# Patient Record
Sex: Male | Born: 1955
Health system: Southern US, Community
[De-identification: ages and names within clinical notes are randomized; demographics above are authoritative.]

## PROBLEM LIST (undated history)

## (undated) DIAGNOSIS — T7840XA Allergy, unspecified, initial encounter: Secondary | ICD-10-CM

## (undated) DIAGNOSIS — R030 Elevated blood-pressure reading, without diagnosis of hypertension: Secondary | ICD-10-CM

## (undated) DIAGNOSIS — K635 Polyp of colon: Secondary | ICD-10-CM

## (undated) DIAGNOSIS — T8859XA Other complications of anesthesia, initial encounter: Secondary | ICD-10-CM

## (undated) DIAGNOSIS — E785 Hyperlipidemia, unspecified: Secondary | ICD-10-CM

## (undated) DIAGNOSIS — L259 Unspecified contact dermatitis, unspecified cause: Secondary | ICD-10-CM

## (undated) DIAGNOSIS — I509 Heart failure, unspecified: Secondary | ICD-10-CM

## (undated) DIAGNOSIS — H9209 Otalgia, unspecified ear: Secondary | ICD-10-CM

## (undated) HISTORY — DX: Polyp of colon: K63.5

## (undated) HISTORY — DX: Otalgia, unspecified ear: H92.09

## (undated) HISTORY — PX: EYE MUSCLE SURGERY: SHX370

## (undated) HISTORY — DX: Hyperlipidemia, unspecified: E78.5

## (undated) HISTORY — DX: Unspecified contact dermatitis, unspecified cause: L25.9

## (undated) HISTORY — PX: ROTATOR CUFF REPAIR: SHX139

## (undated) HISTORY — DX: Allergy, unspecified, initial encounter: T78.40XA

## (undated) HISTORY — DX: Elevated blood-pressure reading, without diagnosis of hypertension: R03.0

## (undated) HISTORY — PX: MIDDLE EAR SURGERY: SHX713

---
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
## 1981-06-28 HISTORY — PX: APPENDECTOMY: SHX54

## 2006-07-28 LAB — HM COLONOSCOPY

## 2007-03-24 ENCOUNTER — Encounter: Payer: Self-pay | Admitting: Family Medicine

## 2007-07-07 ENCOUNTER — Encounter: Payer: Self-pay | Admitting: Family Medicine

## 2008-07-12 ENCOUNTER — Encounter: Payer: Self-pay | Admitting: Family Medicine

## 2008-10-15 ENCOUNTER — Encounter: Payer: Self-pay | Admitting: Family Medicine

## 2009-08-27 ENCOUNTER — Ambulatory Visit: Payer: Self-pay | Admitting: Family Medicine

## 2009-08-27 DIAGNOSIS — E785 Hyperlipidemia, unspecified: Secondary | ICD-10-CM

## 2009-08-27 HISTORY — DX: Hyperlipidemia, unspecified: E78.5

## 2009-08-28 LAB — CONVERTED CEMR LAB
ALT: 26 units/L (ref 0–53)
AST: 25 units/L (ref 0–37)
Albumin: 4.3 g/dL (ref 3.5–5.2)
Alkaline Phosphatase: 69 units/L (ref 39–117)
BUN: 13 mg/dL (ref 6–23)
Basophils Absolute: 0 10*3/uL (ref 0.0–0.1)
Basophils Relative: 0.6 % (ref 0.0–3.0)
Bilirubin, Direct: 0.1 mg/dL (ref 0.0–0.3)
CO2: 27 meq/L (ref 19–32)
Calcium: 9.2 mg/dL (ref 8.4–10.5)
Chloride: 108 meq/L (ref 96–112)
Cholesterol: 158 mg/dL (ref 0–200)
Creatinine, Ser: 0.9 mg/dL (ref 0.4–1.5)
Eosinophils Absolute: 0.3 10*3/uL (ref 0.0–0.7)
Eosinophils Relative: 7.5 % — ABNORMAL HIGH (ref 0.0–5.0)
GFR calc non Af Amer: 93.99 mL/min (ref >60)
Glucose, Bld: 91 mg/dL (ref 70–99)
HCT: 45.8 % (ref 39.0–52.0)
HDL: 42.7 mg/dL (ref >39.00)
Hemoglobin: 15.5 g/dL (ref 13.0–17.0)
LDL Cholesterol: 98 mg/dL (ref 0–99)
Lymphocytes Relative: 21.4 % (ref 12.0–46.0)
Lymphs Abs: 1 10*3/uL (ref 0.7–4.0)
MCHC: 33.9 g/dL (ref 30.0–36.0)
MCV: 93 fL (ref 78.0–100.0)
Monocytes Absolute: 0.4 10*3/uL (ref 0.1–1.0)
Monocytes Relative: 7.7 % (ref 3.0–12.0)
Neutro Abs: 2.9 10*3/uL (ref 1.4–7.7)
Neutrophils Relative %: 62.8 % (ref 43.0–77.0)
PSA: 0.75 ng/mL (ref 0.10–4.00)
Platelets: 186 10*3/uL (ref 150.0–400.0)
Potassium: 4.2 meq/L (ref 3.5–5.1)
RBC: 4.93 M/uL (ref 4.22–5.81)
RDW: 12 % (ref 11.5–14.6)
Sodium: 141 meq/L (ref 135–145)
TSH: 1.04 microintl units/mL (ref 0.35–5.50)
Total Bilirubin: 0.6 mg/dL (ref 0.3–1.2)
Total CHOL/HDL Ratio: 4
Total Protein: 6.9 g/dL (ref 6.0–8.3)
Triglycerides: 86 mg/dL (ref 0.0–149.0)
VLDL: 17.2 mg/dL (ref 0.0–40.0)
WBC: 4.6 10*3/uL (ref 4.5–10.5)

## 2009-09-03 ENCOUNTER — Ambulatory Visit: Payer: Self-pay | Admitting: Family Medicine

## 2009-09-03 ENCOUNTER — Telehealth (INDEPENDENT_AMBULATORY_CARE_PROVIDER_SITE_OTHER): Payer: Self-pay | Admitting: *Deleted

## 2009-09-03 DIAGNOSIS — L259 Unspecified contact dermatitis, unspecified cause: Secondary | ICD-10-CM

## 2009-09-03 HISTORY — DX: Unspecified contact dermatitis, unspecified cause: L25.9

## 2009-09-18 ENCOUNTER — Ambulatory Visit: Payer: Self-pay | Admitting: Family Medicine

## 2009-09-18 LAB — CONVERTED CEMR LAB: Rapid Strep: NEGATIVE

## 2009-09-26 ENCOUNTER — Telehealth: Payer: Self-pay | Admitting: Family Medicine

## 2010-06-11 ENCOUNTER — Ambulatory Visit: Payer: Self-pay | Admitting: Family Medicine

## 2010-06-19 ENCOUNTER — Encounter: Payer: Self-pay | Admitting: Family Medicine

## 2010-06-19 DIAGNOSIS — R030 Elevated blood-pressure reading, without diagnosis of hypertension: Secondary | ICD-10-CM

## 2010-06-19 DIAGNOSIS — H9209 Otalgia, unspecified ear: Secondary | ICD-10-CM | POA: Insufficient documentation

## 2010-06-19 HISTORY — DX: Elevated blood-pressure reading, without diagnosis of hypertension: R03.0

## 2010-06-19 HISTORY — DX: Otalgia, unspecified ear: H92.09

## 2010-06-19 LAB — CONVERTED CEMR LAB
Bilirubin Urine: NEGATIVE
Blood in Urine, dipstick: NEGATIVE
Glucose, Urine, Semiquant: NEGATIVE
Ketones, urine, test strip: NEGATIVE
Nitrite: NEGATIVE
Protein, U semiquant: NEGATIVE
Specific Gravity, Urine: 1.025
Urobilinogen, UA: 0.2
WBC Urine, dipstick: NEGATIVE
pH: 6

## 2010-07-30 NOTE — Assessment & Plan Note (Signed)
Summary: st/njr   Vital Signs:  Patient profile:   55 year old male Temp:     98.8 degrees F oral BP sitting:   130 / 90  (left arm) Cuff size:   regular  Vitals Entered By: Sid Falcon LPN (September 18, 2009 2:17 PM) CC: sore throat X 4 days, URI symptoms   History of Present Illness:       This is a 55 year old man who presents with URI symptoms.  The patient complains of nasal congestion, purulent nasal discharge, sore throat, productive cough, and sick contacts, but denies earache.  The patient denies dyspnea and wheezing.    Allergies: 1)  Penicillin V Potassium (Penicillin V Potassium) 2)  Demerol (Meperidine Hcl)  Past History:  Past Medical History: Last updated: 08/27/2009 Hyperlipidemia colon polyps PMH reviewed for relevance  Review of Systems      See HPI  Physical Exam  General:  Well-developed,well-nourished,in no acute distress; alert,appropriate and cooperative throughout examination Ears:  extensive scarring R TM, L normal. Nose:  clear rhinorrhea. Mouth:  Oral mucosa and oropharynx without lesions or exudates.  Teeth in good repair. Neck:  No deformities, masses, or tenderness noted. Lungs:  Normal respiratory effort, chest expands symmetrically. Lungs are clear to auscultation, no crackles or wheezes. Heart:  normal rate and regular rhythm.     Impression & Recommendations:  Problem # 1:  ACUTE BRONCHITIS (ICD-466.0) supect viral ..  OTC mucinex and f/u as needed.  Rapid strep neg.  Complete Medication List: 1)  Prednisone 10 Mg Tabs (Prednisone) .... Taper as follows:  6-5-4-4-4-3-3-2-2-1-1  Other Orders: Rapid Strep (36644)  Patient Instructions: 1)  Acute Bronchitis symptoms for less then 10 days are not  helped by antibiotics. Take over the counter cough medications. Call if no improvement in 5-7 days, sooner if increasing cough, fever, or new symptoms ( shortness of breath, chest pain) .   Laboratory Results  Date/Time Received: September 18, 2009 2:10 PM Date/Time Reported: September 18, 2009 2:11 PM  Other Tests  Rapid Strep: negative Comments Wynona Canes, CMA  September 18, 2009 2:11 PM

## 2010-07-30 NOTE — Assessment & Plan Note (Signed)
Summary: DOT CPX/RCD    Vital Signs:  Patient profile:   55 year old male Height:      67.5 inches Weight:      194 pounds BMI:     30.04 O2 Sat:      95 % Temp:     98.3 degrees F Pulse rate:   87 / minute BP sitting:   124 / 92  (left arm) Cuff size:   large  Vitals Entered By: Pura Spice, RN (June 19, 2010 8:21 AM)  Nutrition Counseling: Patient's BMI is greater than 25 and therefore counseled on weight management options. CC: DOT form to be completed  Vision Screening:Left eye w/o correction: 20 / 20 Right Eye w/o correction: 20 / 40 Both eyes w/o correction:  20/ 20         History of Present Illness: Pt here for the following:  R ear fullness for couple of weeks.  No real pain. Hx multiple surgeries in past.  No drainage.  No vertigo or hearing changes.  No sinus congestion.  Elevated BP.  Only diastolic borderline high with 16X to low 90s at home no dizziness or chest pain.  No consistent exercise. Watches sodium.  Needs DOT form completed today.  CPE last  March.  Hx of hyperlipidemia but controlled with last labs.  No hx of CAD.  Allergies: 1)  Penicillin V Potassium (Penicillin V Potassium) 2)  Demerol (Meperidine Hcl)  Past History:  Past Medical History: Last updated: 08/27/2009 Hyperlipidemia colon polyps  Past Surgical History: Last updated: 08/27/2009 Appendectomy 1983 Tonsillectomy 1965  Family History: Last updated: 08/27/2009 Family History High cholesterol, father Heart disease-mother, age 42 MI,  Brother MI age 63 Father ?TIA 70s Sister, Type ll diabetes Mother ?lung cancer 52s  Social History: Last updated: 08/27/2009 Occupation:  Technical sales engineer Married Never Smoked Alcohol use-no  Risk Factors: Smoking Status: never (08/27/2009) PMH-FH-SH reviewed for relevance  Review of Systems  The patient denies anorexia, fever, weight loss, vision loss, decreased hearing, hoarseness, chest pain, syncope, dyspnea on  exertion, peripheral edema, prolonged cough, headaches, hemoptysis, abdominal pain, melena, hematochezia, and severe indigestion/heartburn.    Physical Exam  General:  Well-developed,well-nourished,in no acute distress; alert,appropriate and cooperative throughout examination Head:  Normocephalic and atraumatic without obvious abnormalities. No apparent alopecia or balding. Eyes:  No corneal or conjunctival inflammation noted. EOMI. Perrla. Funduscopic exam benign, without hemorrhages, exudates or papilledema. Vision grossly normal. Ears:  Scarring R TM.  NO erythema.  no drainage. No acute changes. Mouth:  Oral mucosa and oropharynx without lesions or exudates.  Teeth in good repair. Neck:  No deformities, masses, or tenderness noted. Lungs:  Normal respiratory effort, chest expands symmetrically. Lungs are clear to auscultation, no crackles or wheezes. Heart:  Normal rate and regular rhythm. S1 and S2 normal without gallop, murmur, click, rub or other extra sounds. Abdomen:  Bowel sounds positive,abdomen soft and non-tender without masses, organomegaly or hernias noted. Genitalia:  Testes bilaterally descended without nodularity, tenderness or masses. No scrotal masses or lesions. No penis lesions or urethral discharge. Extremities:  No clubbing, cyanosis, edema, or deformity noted with normal full range of motion of all joints.   Neurologic:  alert & oriented X3 and cranial nerves II-XII intact.   Skin:  Intact without suspicious lesions or rashes Cervical Nodes:  No lymphadenopathy noted Psych:  normally interactive, good eye contact, not anxious appearing, and not depressed appearing.     Impression & Recommendations:  Problem # 1:  ELEVATED BLOOD PRESSURE (ICD-796.2) work on weight loss, low NA, exercise and close monitoring.  Problem # 2:  UNSPECIFIED OTALGIA (ICD-388.70) hx of chronic infections.  NO acute changes.  Problem # 3:  HYPERLIPIDEMIA (ICD-272.4) controlled with last  labs.  Complete Medication List: 1)  Prednisone 10 Mg Tabs (Prednisone) .... Taper as follows:  6-5-4-4-4-3-3-2-2-1-1  Other Orders: UA Dipstick w/o Micro (manual) (16109)  Patient Instructions: 1)  It is important that you exercise reguarly at least 20 minutes 5 times a week. If you develop chest pain, have severe difficulty breathing, or feel very tired, stop exercising immediately and seek medical attention.  2)  You need to lose weight. Consider a lower calorie diet and regular exercise.  3)  Please schedule a follow-up appointment in 1 year.  4)  Check your  Blood Pressure regularly . If it is above:140/90   you should make an appointment.   Orders Added: 1)  Est. Patient Level IV [60454] 2)  UA Dipstick w/o Micro (manual) [81002]      Laboratory Results   Urine Tests  Date/Time Received: June 19, 2010  Date/Time Reported: 8:40 AM   Routine Urinalysis   Color: yellow Appearance: Clear Glucose: negative   (Normal Range: Negative) Bilirubin: negative   (Normal Range: Negative) Ketone: negative   (Normal Range: Negative) Spec. Gravity: 1.025   (Normal Range: 1.003-1.035) Blood: negative   (Normal Range: Negative) pH: 6.0   (Normal Range: 5.0-8.0) Protein: negative   (Normal Range: Negative) Urobilinogen: 0.2   (Normal Range: 0-1) Nitrite: negative   (Normal Range: Negative) Leukocyte Esterace: negative   (Normal Range: Negative)    Comments: Pura Spice, RN  June 19, 2010 8:41 AM

## 2010-07-30 NOTE — Assessment & Plan Note (Signed)
Summary: poison ivy on legs//lch   Vital Signs:  Patient profile:   55 year old male Temp:     97.7 degrees F oral BP sitting:   140 / 90  (left arm) Cuff size:   regular  Vitals Entered By: Sid Falcon LPN (September 03, 1608 1:27 PM) CC: poison Ivy bil legs   History of Present Illness: Pruritic rash both lower legs which started recently. Noted after working outdoors. Similar rash with contact dermatitis the past. Rash is nonpainful. Has taken antihistamines without much improvement. No fever or chills. Has not tried anything topically  Allergies: 1)  Penicillin V Potassium (Penicillin V Potassium) 2)  Demerol (Meperidine Hcl)  Past History:  Past Medical History: Last updated: 08/27/2009 Hyperlipidemia colon polyps PMH reviewed for relevance  Review of Systems      See HPI  Physical Exam  General:  Well-developed,well-nourished,in no acute distress; alert,appropriate and cooperative throughout examination Lungs:  Normal respiratory effort, chest expands symmetrically. Lungs are clear to auscultation, no crackles or wheezes. Heart:  Normal rate and regular rhythm. S1 and S2 normal without gallop, murmur, click, rub or other extra sounds. Skin:  patient has slightly raised erythematous slightly blistery rash left and right calf and lower leg region. Nontender. No evidence for cellulitis   Impression & Recommendations:  Problem # 1:  CONTACT DERMATITIS (ICD-692.9) Prednisone taper and reviewed possible side effects. His updated medication list for this problem includes:    Prednisone 10 Mg Tabs (Prednisone) .Marland Kitchen... Taper as follows:  6-5-4-4-4-3-3-2-2-1-1  Complete Medication List: 1)  Prednisone 10 Mg Tabs (Prednisone) .... Taper as follows:  6-5-4-4-4-3-3-2-2-1-1  Patient Instructions: 1)  Touch base if rash not resolving over the next week. Prescriptions: PREDNISONE 10 MG TABS (PREDNISONE) taper as follows:  6-5-4-4-4-3-3-2-2-1-1  #35 x 0   Entered and Authorized  by:   Evelena Peat MD   Signed by:   Evelena Peat MD on 09/03/2009   Method used:   Electronically to        [facility]* (retail)       [address] [place], [place]  [address]       Ph: [phone]       Fax: [phone]   RxID:   [id number]

## 2010-07-30 NOTE — Letter (Signed)
Summary: [facility] at [facility]   Imported By: Maryln Gottron 08/29/2009 11:13:46  _____________________________________________________________________  External Attachment:    Type:   Image     Comment:   External Document

## 2010-07-30 NOTE — Progress Notes (Signed)
Summary: Poison Ivy, OV or RX  ---- Converted from flag ---- ---- 09/03/2009 8:32 AM, Harold Barban wrote: Patient called and said he has gotten in to some poison ivy really bad on his legs. Does he need to be seen or will Dr. Caryl Never call something in for him. [facility] is [facility] [address]. ------------------------------  Phone Note From Other Clinic    Follow-up for Phone Call        really needs to be seen to make sure this is contact dermatitis Follow-up by: Evelena Peat MD,  September 03, 2009 9:06 AM  Additional Follow-up for Phone Call Additional follow up Details #1::        patient is coming at 1:15 this afternoon.      Called pt back, and he could not hear on his phone. Lynann Beaver CMA  September 03, 2009 9:15 AM

## 2010-07-30 NOTE — Procedures (Signed)
Summary: Colonoscopy/[facility]  Colonoscopy/[facility]   Imported By: Maryln Gottron 08/29/2009 11:21:09  _____________________________________________________________________  External Attachment:    Type:   Image     Comment:   External Document

## 2010-07-30 NOTE — Progress Notes (Signed)
Summary: another round of prednisone request, OKed  Phone Note Call from Patient Call back at Home Phone [phone]   Caller: Daughter-liz Call For: Evelena Peat MD Summary of Call: pt is requesting another round of prednisone call into [facility] [phone]. pt was seen on 09-18-2009.  Initial call taken by: Heron Sabins,  September 26, 2009 11:56 AM  Follow-up for Phone Call        OK to refill once. Follow-up by: Evelena Peat MD,  September 26, 2009 12:19 PM  Additional Follow-up for Phone Call Additional follow up Details #1::        Rx sent, pt informed Additional Follow-up by: Sid Falcon LPN,  September 26, 2009 12:26 PM    Prescriptions: PREDNISONE 10 MG TABS (PREDNISONE) taper as follows:  6-5-4-4-4-3-3-2-2-1-1  #35 x 0   Entered by:   Sid Falcon LPN   Authorized by:   Evelena Peat MD   Signed by:   Sid Falcon LPN on 47/82/9562   Method used:   Electronically to        [facility]* (retail)       [address] [place], [place]  [address]       Ph: [phone]       Fax: [phone]   RxID:   [id number]

## 2010-07-30 NOTE — Assessment & Plan Note (Signed)
Summary: new to est//pt requesting cpx//lh   Vital Signs:  Patient profile:   55 year old male Height:      66 inches Weight:      192 pounds BMI:     31.10 Temp:     98.0 degrees F oral Pulse rate:   80 / minute Pulse rhythm:   regular Resp:     12 per minute BP sitting:   150 / 90  (left arm) Cuff size:   regular  Vitals Entered By: Sid Falcon LPN (August 28, 2723 9:05 AM)  Nutrition Counseling: Patient's BMI is greater than 25 and therefore counseled on weight management options.  Serial Vital Signs/Assessments:  Time      Position  BP       Pulse  Resp  Temp     By                     130/98                         Evelena Peat MD  CC: New to establish, requesting CPX Is Patient Diabetic? No   History of Present Illness: New patient to establish care and for complete physical examination.  Past medical history significant for mild hyperlipidemia and history of benign colon polyps. Previous colonoscopy 2008. Patient has recently been exercising regularly. Tetanus booster 2008. Appendectomy 1983. Tonsillectomy 1965.  In reviewing old records has had mildly elevated blood pressures for a few years with mostly elevated diastolic in the 90s. Systolic is usually around 130.  Family history significant for mother having coronary disease and a reported MI at age 26. She died age 49 of uncertain etiology possibly lung cancer. Brother had sudden death secondary to MI age 5. Sister with type 2 diabetes. Father reportedly had TIAs in his 57s.  Patient is married and has 2 daughters. Nonsmoker. No alcohol use.  Preventive Screening-Counseling & Management  Alcohol-Tobacco     Smoking Status: never  Allergies (verified): 1)  Penicillin V Potassium (Penicillin V Potassium) 2)  Demerol (Meperidine Hcl)  Past History:  Family History: Last updated: 08/27/2009 Family History High cholesterol, father Heart disease-mother, age 12 MI,  Brother MI age 46 Father ?TIA  31s Sister, Type ll diabetes Mother ?lung cancer 53s  Social History: Last updated: 08/27/2009 Occupation:  Technical sales engineer Married Never Smoked Alcohol use-no  Risk Factors: Smoking Status: never (08/27/2009)  Past Medical History: Hyperlipidemia colon polyps  Past Surgical History: Appendectomy 1983 Tonsillectomy 1965 PMH-FH-SH reviewed for relevance  Family History: Family History High cholesterol, father Heart disease-mother, age 69 MI,  Brother MI age 19 Father ?TIA 66s Sister, Type ll diabetes Mother ?lung cancer 50s  Social History: Occupation:  Technical sales engineer Married Never Smoked Alcohol use-no Smoking Status:  never Occupation:  employed  Review of Systems  The patient denies anorexia, fever, weight gain, vision loss, decreased hearing, hoarseness, chest pain, syncope, dyspnea on exertion, peripheral edema, prolonged cough, headaches, hemoptysis, abdominal pain, melena, hematochezia, severe indigestion/heartburn, hematuria, incontinence, muscle weakness, suspicious skin lesions, depression, enlarged lymph nodes, and testicular masses.    Physical Exam  General:  Well-developed,well-nourished,in no acute distress; alert,appropriate and cooperative throughout examination Head:  Normocephalic and atraumatic without obvious abnormalities. No apparent alopecia or balding. Eyes:  No corneal or conjunctival inflammation noted. EOMI. Perrla. Funduscopic exam benign, without hemorrhages, exudates or papilledema. Vision grossly normal. Ears:  extensive scarring eardrums right greater than left Nose:  External nasal examination shows no deformity or inflammation. Nasal mucosa are pink and moist without lesions or exudates. Mouth:  Oral mucosa and oropharynx without lesions or exudates.  Teeth in good repair. Neck:  No deformities, masses, or tenderness noted. Lungs:  Normal respiratory effort, chest expands symmetrically. Lungs are clear to auscultation, no  crackles or wheezes. Heart:  Normal rate and regular rhythm. S1 and S2 normal without gallop, murmur, click, rub or other extra sounds. Abdomen:  Bowel sounds positive,abdomen soft and non-tender without masses, organomegaly or hernias noted. Rectal:  No external abnormalities noted. Normal sphincter tone. No rectal masses or tenderness. Genitalia:  Testes bilaterally descended without nodularity, tenderness or masses. No scrotal masses or lesions. No penis lesions or urethral discharge. Prostate:  Prostate gland firm and smooth, no enlargement, nodularity, tenderness, mass, asymmetry or induration. Msk:  No deformity or scoliosis noted of thoracic or lumbar spine.   Extremities:  No clubbing, cyanosis, edema, or deformity noted with normal full range of motion of all joints.   Neurologic:  No cranial nerve deficits noted. Station and gait are normal. Plantar reflexes are down-going bilaterally. DTRs are symmetrical throughout. Sensory, motor and coordinative functions appear intact. Skin:  Intact without suspicious lesions or rashes Cervical Nodes:  No lymphadenopathy noted Psych:  normally interactive, good eye contact, not anxious appearing, and not depressed appearing.     Impression & Recommendations:  Problem # 1:  Preventive Health Care (ICD-V70.0) Obtain screening lab work. Follow up one month to reassess blood pressure and consider treatment at that point if still elevated. Continue regular exercise. Tetanus up to date.  Other Orders: TLB-Lipid Panel (80061-LIPID) TLB-BMP (Basic Metabolic Panel-BMET) (80048-METABOL) TLB-CBC Platelet - w/Differential (85025-CBCD) TLB-Hepatic/Liver Function Pnl (80076-HEPATIC) TLB-TSH (Thyroid Stimulating Hormone) (84443-TSH) TLB-PSA (Prostate Specific Antigen) (84153-PSA) Venipuncture (72536)  Patient Instructions: 1)  Please schedule a follow-up appointment in 1 month.  2)  Limit your Sodium(salt) .  3)  It is important that you exercise  reguarly at least 20 minutes 5 times a week. If you develop chest pain, have severe difficulty breathing, or feel very tired, stop exercising immediately and seek medical attention.   Preventive Care Screening  Last Tetanus Booster:    Date:  03/24/2007    Results:  Tdap   Colonoscopy:    Date:  06/28/2006    Results:  polyps 3     Preventive Care Screening  Last Tetanus Booster:    Date:  03/24/2007    Results:  Tdap   Colonoscopy:    Date:  06/28/2006    Results:  polyps 3

## 2010-08-05 NOTE — Letter (Signed)
Summary: Medical Exam for Scientist, research (life sciences) Exam for [employer]   Imported By: Maryln Gottron 07/27/2010 09:54:19  _____________________________________________________________________  External Attachment:    Type:   Image     Comment:   External Document

## 2011-02-15 ENCOUNTER — Other Ambulatory Visit (INDEPENDENT_AMBULATORY_CARE_PROVIDER_SITE_OTHER): Payer: BC Managed Care – PPO

## 2011-02-15 DIAGNOSIS — Z0000 Encounter for general adult medical examination without abnormal findings: Secondary | ICD-10-CM

## 2011-02-15 LAB — LIPID PANEL
Cholesterol: 163 mg/dL (ref 0–200)
HDL: 37.5 mg/dL — ABNORMAL LOW (ref >39.00)
LDL Cholesterol: 105 mg/dL — ABNORMAL HIGH (ref 0–99)
Total CHOL/HDL Ratio: 4
Triglycerides: 105 mg/dL (ref 0.0–149.0)
VLDL: 21 mg/dL (ref 0.0–40.0)

## 2011-02-15 LAB — BASIC METABOLIC PANEL
BUN: 15 mg/dL (ref 6–23)
CO2: 24 mEq/L (ref 19–32)
Calcium: 9.2 mg/dL (ref 8.4–10.5)
Chloride: 106 mEq/L (ref 96–112)
Creatinine, Ser: 1 mg/dL (ref 0.4–1.5)
GFR: 81.82 mL/min (ref >60.00)
Glucose, Bld: 101 mg/dL — ABNORMAL HIGH (ref 70–99)
Potassium: 4.1 mEq/L (ref 3.5–5.1)
Sodium: 138 mEq/L (ref 135–145)

## 2011-02-15 LAB — HEPATIC FUNCTION PANEL
ALT: 26 U/L (ref 0–53)
AST: 25 U/L (ref 0–37)
Albumin: 4.3 g/dL (ref 3.5–5.2)
Alkaline Phosphatase: 66 U/L (ref 39–117)
Bilirubin, Direct: 0.1 mg/dL (ref 0.0–0.3)
Total Bilirubin: 0.9 mg/dL (ref 0.3–1.2)
Total Protein: 6.7 g/dL (ref 6.0–8.3)

## 2011-02-15 LAB — CBC WITH DIFFERENTIAL/PLATELET
Basophils Absolute: 0 10*3/uL (ref 0.0–0.1)
Basophils Relative: 0.7 % (ref 0.0–3.0)
Eosinophils Absolute: 0.5 10*3/uL (ref 0.0–0.7)
Eosinophils Relative: 10.7 % — ABNORMAL HIGH (ref 0.0–5.0)
HCT: 45.1 % (ref 39.0–52.0)
Hemoglobin: 15.3 g/dL (ref 13.0–17.0)
Lymphocytes Relative: 24.3 % (ref 12.0–46.0)
Lymphs Abs: 1.2 10*3/uL (ref 0.7–4.0)
MCHC: 34 g/dL (ref 30.0–36.0)
MCV: 92.6 fl (ref 78.0–100.0)
Monocytes Absolute: 0.4 10*3/uL (ref 0.1–1.0)
Monocytes Relative: 7.9 % (ref 3.0–12.0)
Neutro Abs: 2.7 10*3/uL (ref 1.4–7.7)
Neutrophils Relative %: 56.4 % (ref 43.0–77.0)
Platelets: 190 10*3/uL (ref 150.0–400.0)
RBC: 4.86 Mil/uL (ref 4.22–5.81)
RDW: 13.3 % (ref 11.5–14.6)
WBC: 4.8 10*3/uL (ref 4.5–10.5)

## 2011-02-15 LAB — POCT URINALYSIS DIPSTICK
Bilirubin, UA: NEGATIVE
Blood, UA: NEGATIVE
Glucose, UA: NEGATIVE
Ketones, UA: NEGATIVE
Leukocytes, UA: NEGATIVE
Nitrite, UA: NEGATIVE
Protein, UA: NEGATIVE
Spec Grav, UA: 1.025
Urobilinogen, UA: 0.2
pH, UA: 5

## 2011-02-15 LAB — TSH: TSH: 1.06 u[IU]/mL (ref 0.35–5.50)

## 2011-02-15 LAB — PSA: PSA: 0.77 ng/mL (ref 0.10–4.00)

## 2011-02-26 ENCOUNTER — Encounter: Payer: Self-pay | Admitting: Family Medicine

## 2011-02-26 ENCOUNTER — Ambulatory Visit (INDEPENDENT_AMBULATORY_CARE_PROVIDER_SITE_OTHER): Payer: BC Managed Care – PPO | Admitting: Family Medicine

## 2011-02-26 VITALS — BP 120/84 | Temp 98.4°F | Ht 65.5 in | Wt 196.0 lb

## 2011-02-26 DIAGNOSIS — J309 Allergic rhinitis, unspecified: Secondary | ICD-10-CM

## 2011-02-26 DIAGNOSIS — Z0000 Encounter for general adult medical examination without abnormal findings: Secondary | ICD-10-CM

## 2011-02-26 MED ORDER — AZELASTINE HCL 0.1 % NA SOLN: 1.0000 | Freq: Two times a day (BID) | NASAL | Status: DC

## 2011-02-26 NOTE — Progress Notes (Signed)
Subjective:    Patient ID: Jerry Kramer, male    DOB: 29-Sep-1955, 55 y.o.   MRN: [id number]  HPI Patient here for complete physical. He has no significant chronic medical problems. Colonoscopy 4 years ago. Tetanus 2008. Only complaint today is some frequent clearing of throat and postnasal drip symptoms past month. Possibly allergic. No indicators of infection. Has taken occasional antihistamine with minimal improvement. Nonsmoker. Social history and family history reviewed. 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 Strong family history of heart disease in mother and brother. They were both smokers.  Patient has had some weight gain during the past year. Not exercising much.  Past Medical History  Diagnosis Date  . Hyperlipidemia   . Colon polyps   . HYPERLIPIDEMIA 08/27/2009  . CONTACT DERMATITIS 09/03/2009  . UNSPECIFIED OTALGIA 06/19/2010  . ELEVATED BLOOD PRESSURE 06/19/2010   Past Surgical History  Procedure Date  . Appendectomy 1983  . Tonsillectomy 1965    reports that he has never smoked. He does not have any smokeless tobacco history on file. His alcohol and drug histories not on file. family history includes Cancer in his mother; Diabetes in his sister; Heart attack (age of onset:48) in his brother and father; Heart disease (age of onset:49) in his brother and mother; Hyperlipidemia in his father; and Stroke in his father. Allergies  Allergen Reactions  . Meperidine Hcl     REACTION: unspecified  . Penicillins     REACTION: unspecified      Review of Systems  Constitutional: Negative for fever, activity change, appetite change and fatigue.  HENT: Negative for ear pain, congestion and trouble swallowing.   Eyes: Negative for pain and visual disturbance.  Respiratory: Negative for cough, shortness of breath and wheezing.   Cardiovascular: Negative for chest pain and palpitations.  Gastrointestinal: Negative for nausea, vomiting, abdominal pain, diarrhea,  constipation, blood in stool, abdominal distention and rectal pain.  Genitourinary: Negative for dysuria, hematuria and testicular pain.  Musculoskeletal: Negative for joint swelling and arthralgias.  Skin: Negative for rash.  Neurological: Negative for dizziness, syncope and headaches.  Hematological: Negative for adenopathy.  Psychiatric/Behavioral: Negative for confusion and dysphoric mood.       Objective:   Physical Exam  Constitutional: He is oriented to person, place, and time. He appears well-developed and well-nourished. No distress.  HENT:  Head: Normocephalic and atraumatic.  Right Ear: External ear normal.  Left Ear: External ear normal.  Mouth/Throat: Oropharynx is clear and moist.  Eyes: Conjunctivae and EOM are normal. Pupils are equal, round, and reactive to light.  Neck: Normal range of motion. Neck supple. No thyromegaly present.  Cardiovascular: Normal rate, regular rhythm and normal heart sounds.   No murmur heard. Pulmonary/Chest: No respiratory distress. He has no wheezes. He has no rales.  Abdominal: Soft. Bowel sounds are normal. He exhibits no distension and no mass. There is no tenderness. There is no rebound and no guarding.  Genitourinary: Rectum normal and prostate normal.  Musculoskeletal: He exhibits no edema.  Lymphadenopathy:    He has no cervical adenopathy.  Neurological: He is alert and oriented to person, place, and time. He displays normal reflexes. No cranial nerve deficit.  Skin: No rash noted.  Psychiatric: He has a normal mood and affect.          Assessment & Plan:  Complete physical exam. Labs reviewed with patient.  Pre-diabetes with glucose 101. Needs to lose weight and start regular exercise. Allergic rhinitis with postnasal  drip.  Astelin one spray per nostril twice daily as needed

## 2011-02-26 NOTE — Patient Instructions (Signed)
You need to lose weight and establish regular exercise. Reduce sodas and sweetened tea.

## 2012-04-11 ENCOUNTER — Other Ambulatory Visit (INDEPENDENT_AMBULATORY_CARE_PROVIDER_SITE_OTHER): Payer: BC Managed Care – PPO

## 2012-04-11 DIAGNOSIS — Z0000 Encounter for general adult medical examination without abnormal findings: Secondary | ICD-10-CM

## 2012-04-11 LAB — LIPID PANEL
Cholesterol: 153 mg/dL (ref 0–200)
HDL: 33.7 mg/dL — ABNORMAL LOW (ref >39.00)
LDL Cholesterol: 102 mg/dL — ABNORMAL HIGH (ref 0–99)
Total CHOL/HDL Ratio: 5
Triglycerides: 87 mg/dL (ref 0.0–149.0)
VLDL: 17.4 mg/dL (ref 0.0–40.0)

## 2012-04-11 LAB — CBC WITH DIFFERENTIAL/PLATELET
Basophils Absolute: 0 10*3/uL (ref 0.0–0.1)
Basophils Relative: 0.8 % (ref 0.0–3.0)
Eosinophils Absolute: 0.4 10*3/uL (ref 0.0–0.7)
Eosinophils Relative: 9.1 % — ABNORMAL HIGH (ref 0.0–5.0)
HCT: 46.2 % (ref 39.0–52.0)
Hemoglobin: 15.4 g/dL (ref 13.0–17.0)
Lymphocytes Relative: 25 % (ref 12.0–46.0)
Lymphs Abs: 1.2 10*3/uL (ref 0.7–4.0)
MCHC: 33.3 g/dL (ref 30.0–36.0)
MCV: 92 fl (ref 78.0–100.0)
Monocytes Absolute: 0.4 10*3/uL (ref 0.1–1.0)
Monocytes Relative: 8.7 % (ref 3.0–12.0)
Neutro Abs: 2.7 10*3/uL (ref 1.4–7.7)
Neutrophils Relative %: 56.4 % (ref 43.0–77.0)
Platelets: 197 10*3/uL (ref 150.0–400.0)
RBC: 5.02 Mil/uL (ref 4.22–5.81)
RDW: 13.1 % (ref 11.5–14.6)
WBC: 4.8 10*3/uL (ref 4.5–10.5)

## 2012-04-11 LAB — POCT URINALYSIS DIPSTICK
Bilirubin, UA: NEGATIVE
Blood, UA: NEGATIVE
Glucose, UA: NEGATIVE
Ketones, UA: NEGATIVE
Leukocytes, UA: NEGATIVE
Nitrite, UA: NEGATIVE
Protein, UA: NEGATIVE
Spec Grav, UA: 1.025
Urobilinogen, UA: 0.2
pH, UA: 5.5

## 2012-04-11 LAB — HEPATIC FUNCTION PANEL
ALT: 27 U/L (ref 0–53)
AST: 24 U/L (ref 0–37)
Albumin: 4.3 g/dL (ref 3.5–5.2)
Alkaline Phosphatase: 63 U/L (ref 39–117)
Bilirubin, Direct: 0.2 mg/dL (ref 0.0–0.3)
Total Bilirubin: 0.9 mg/dL (ref 0.3–1.2)
Total Protein: 7 g/dL (ref 6.0–8.3)

## 2012-04-11 LAB — PSA: PSA: 0.54 ng/mL (ref 0.10–4.00)

## 2012-04-11 LAB — BASIC METABOLIC PANEL
BUN: 13 mg/dL (ref 6–23)
CO2: 26 mEq/L (ref 19–32)
Calcium: 9.4 mg/dL (ref 8.4–10.5)
Chloride: 105 mEq/L (ref 96–112)
Creatinine, Ser: 1 mg/dL (ref 0.4–1.5)
GFR: 83.38 mL/min (ref >60.00)
Glucose, Bld: 89 mg/dL (ref 70–99)
Potassium: 4.3 mEq/L (ref 3.5–5.1)
Sodium: 138 mEq/L (ref 135–145)

## 2012-04-11 LAB — TSH: TSH: 1.05 u[IU]/mL (ref 0.35–5.50)

## 2012-04-18 ENCOUNTER — Encounter: Payer: Self-pay | Admitting: Family Medicine

## 2012-04-18 ENCOUNTER — Ambulatory Visit (INDEPENDENT_AMBULATORY_CARE_PROVIDER_SITE_OTHER): Payer: BC Managed Care – PPO | Admitting: Family Medicine

## 2012-04-18 VITALS — BP 120/84 | HR 72 | Temp 97.6°F | Resp 12 | Ht 66.5 in | Wt 197.0 lb

## 2012-04-18 DIAGNOSIS — E785 Hyperlipidemia, unspecified: Secondary | ICD-10-CM

## 2012-04-18 DIAGNOSIS — Z0000 Encounter for general adult medical examination without abnormal findings: Secondary | ICD-10-CM

## 2012-04-18 DIAGNOSIS — R030 Elevated blood-pressure reading, without diagnosis of hypertension: Secondary | ICD-10-CM

## 2012-04-18 NOTE — Progress Notes (Signed)
  Subjective:    Patient ID: Jerry Kramer, male    DOB: [birth year], 55 y.o.   MRN: [id number]  HPI  Here for complete physical. Patient has history of hyperlipidemia and prior history of borderline elevated blood pressure. Seasonal allergies and rarely takes Astelin. Tetanus up-to-date. Colonoscopy age 45. Nonsmoker. No consistent exercise but has maintained his weight. He is making positive dietary changes since last year. Reduction of sugar and starches  Past Medical History  Diagnosis Date  . Hyperlipidemia   . Colon polyps   . HYPERLIPIDEMIA 08/27/2009  . CONTACT DERMATITIS 09/03/2009  . UNSPECIFIED OTALGIA 06/19/2010  . ELEVATED BLOOD PRESSURE 06/19/2010   Past Surgical History  Procedure Date  . Appendectomy 1983  . Tonsillectomy 1965    reports that he has never smoked. He does not have any smokeless tobacco history on file. His alcohol and drug histories not on file. family history includes Cancer in his mother; Diabetes in his sister; Heart attack (age of onset:48) in his brother and father; Heart disease (age of onset:49) in his brother and mother; Hyperlipidemia in his father; and Stroke in his father. Allergies  Allergen Reactions  . Meperidine Hcl     REACTION: unspecified  . Penicillins     REACTION: unspecified     Review of Systems  Constitutional: Negative for fever, activity change, appetite change and fatigue.  HENT: Negative for ear pain, congestion and trouble swallowing.   Eyes: Negative for pain and visual disturbance.  Respiratory: Negative for cough, shortness of breath and wheezing.   Cardiovascular: Negative for chest pain and palpitations.  Gastrointestinal: Negative for nausea, vomiting, abdominal pain, diarrhea, constipation, blood in stool, abdominal distention and rectal pain.  Genitourinary: Negative for dysuria, hematuria and testicular pain.  Musculoskeletal: Negative for joint swelling and arthralgias.  Skin: Negative for rash.  Neurological:  Negative for dizziness, syncope and headaches.  Hematological: Negative for adenopathy.  Psychiatric/Behavioral: Negative for confusion and dysphoric mood.       Objective:   Physical Exam  Constitutional: He is oriented to person, place, and time. He appears well-developed and well-nourished. No distress.  HENT:  Head: Normocephalic and atraumatic.  Right Ear: External ear normal.  Left Ear: External ear normal.  Mouth/Throat: Oropharynx is clear and moist.  Eyes: Conjunctivae normal and EOM are normal. Pupils are equal, round, and reactive to light.  Neck: Normal range of motion. Neck supple. No thyromegaly present.  Cardiovascular: Normal rate, regular rhythm and normal heart sounds.   No murmur heard. Pulmonary/Chest: No respiratory distress. He has no wheezes. He has no rales.  Abdominal: Soft. Bowel sounds are normal. He exhibits no distension and no mass. There is no tenderness. There is no rebound and no guarding.  Musculoskeletal: He exhibits no edema.  Lymphadenopathy:    He has no cervical adenopathy.  Neurological: He is alert and oriented to person, place, and time. He displays normal reflexes. No cranial nerve deficit.  Skin: No rash noted.  Psychiatric: He has a normal mood and affect.          Assessment & Plan:  Complete physical. Labs reviewed with patient. Tetanus up-to-date. Flu vaccine through his employer. Establish more consistent exercise.

## 2013-04-23 ENCOUNTER — Encounter: Payer: Self-pay | Admitting: Family Medicine

## 2013-04-23 ENCOUNTER — Ambulatory Visit (INDEPENDENT_AMBULATORY_CARE_PROVIDER_SITE_OTHER): Payer: No Typology Code available for payment source | Admitting: Family Medicine

## 2013-04-23 VITALS — BP 132/80 | HR 62 | Temp 98.3°F | Wt 192.0 lb

## 2013-04-23 DIAGNOSIS — S91209A Unspecified open wound of unspecified toe(s) with damage to nail, initial encounter: Secondary | ICD-10-CM

## 2013-04-23 DIAGNOSIS — S91109A Unspecified open wound of unspecified toe(s) without damage to nail, initial encounter: Secondary | ICD-10-CM

## 2013-04-23 NOTE — Progress Notes (Signed)
  Subjective:    Patient ID: Jerry Kramer, male    DOB: [birth year], 57 y.o.   MRN: [id number]  HPI Here to check his right great toe after he had the nail partly avulsed yesterday by the edge of a jet ski. He was able to stop the bleeding and wrap the toe up. Today it is mildly tender only.   Review of Systems  Constitutional: Negative.   Skin: Positive for wound.       Objective:   Physical Exam  Constitutional: He appears well-developed and well-nourished.  Skin:  The right great toenail has been partly avulsed with the proximal lateral corner having been pulled out of the matrix. The distal edge of the nail is intact. No bleeding.           Assessment & Plan:  Partly avulsed nail. The area is clean. He will simply dress it with Neosporin and gauze daily. The nail may or may not come off. A note was written to allow him to wear flip flops to work from today until 05-04-13.

## 2014-04-15 ENCOUNTER — Other Ambulatory Visit (INDEPENDENT_AMBULATORY_CARE_PROVIDER_SITE_OTHER): Payer: No Typology Code available for payment source

## 2014-04-15 DIAGNOSIS — Z0000 Encounter for general adult medical examination without abnormal findings: Secondary | ICD-10-CM

## 2014-04-15 LAB — POCT URINALYSIS DIPSTICK
Bilirubin, UA: NEGATIVE
Glucose, UA: NEGATIVE
Ketones, UA: NEGATIVE
Leukocytes, UA: NEGATIVE
Nitrite, UA: NEGATIVE
Protein, UA: NEGATIVE
Spec Grav, UA: 1.015
Urobilinogen, UA: 0.2
pH, UA: 5.5

## 2014-04-15 LAB — BASIC METABOLIC PANEL
BUN: 16 mg/dL (ref 6–23)
CO2: 24 mEq/L (ref 19–32)
Calcium: 9.6 mg/dL (ref 8.4–10.5)
Chloride: 103 mEq/L (ref 96–112)
Creatinine, Ser: 1 mg/dL (ref 0.4–1.5)
GFR: 86.81 mL/min (ref >60.00)
Glucose, Bld: 97 mg/dL (ref 70–99)
Potassium: 4.3 mEq/L (ref 3.5–5.1)
Sodium: 139 mEq/L (ref 135–145)

## 2014-04-15 LAB — CBC WITH DIFFERENTIAL/PLATELET
Basophils Absolute: 0 10*3/uL (ref 0.0–0.1)
Basophils Relative: 0.5 % (ref 0.0–3.0)
Eosinophils Absolute: 0.4 10*3/uL (ref 0.0–0.7)
Eosinophils Relative: 7 % — ABNORMAL HIGH (ref 0.0–5.0)
HCT: 48.6 % (ref 39.0–52.0)
Hemoglobin: 16.3 g/dL (ref 13.0–17.0)
Lymphocytes Relative: 21.8 % (ref 12.0–46.0)
Lymphs Abs: 1.3 10*3/uL (ref 0.7–4.0)
MCHC: 33.5 g/dL (ref 30.0–36.0)
MCV: 91.7 fl (ref 78.0–100.0)
Monocytes Absolute: 0.5 10*3/uL (ref 0.1–1.0)
Monocytes Relative: 7.9 % (ref 3.0–12.0)
Neutro Abs: 3.6 10*3/uL (ref 1.4–7.7)
Neutrophils Relative %: 62.8 % (ref 43.0–77.0)
Platelets: 203 10*3/uL (ref 150.0–400.0)
RBC: 5.31 Mil/uL (ref 4.22–5.81)
RDW: 13 % (ref 11.5–15.5)
WBC: 5.7 10*3/uL (ref 4.0–10.5)

## 2014-04-15 LAB — LIPID PANEL
Cholesterol: 159 mg/dL (ref 0–200)
HDL: 33.4 mg/dL — ABNORMAL LOW (ref >39.00)
LDL Cholesterol: 102 mg/dL — ABNORMAL HIGH (ref 0–99)
NonHDL: 125.6
Total CHOL/HDL Ratio: 5
Triglycerides: 118 mg/dL (ref 0.0–149.0)
VLDL: 23.6 mg/dL (ref 0.0–40.0)

## 2014-04-15 LAB — HEPATIC FUNCTION PANEL
ALT: 27 U/L (ref 0–53)
AST: 22 U/L (ref 0–37)
Albumin: 4.1 g/dL (ref 3.5–5.2)
Alkaline Phosphatase: 76 U/L (ref 39–117)
Bilirubin, Direct: 0.2 mg/dL (ref 0.0–0.3)
Total Bilirubin: 0.8 mg/dL (ref 0.2–1.2)
Total Protein: 7.5 g/dL (ref 6.0–8.3)

## 2014-04-15 LAB — TSH: TSH: 0.98 u[IU]/mL (ref 0.35–4.50)

## 2014-04-15 LAB — PSA: PSA: 0.93 ng/mL (ref 0.10–4.00)

## 2014-04-26 ENCOUNTER — Encounter: Payer: Self-pay | Admitting: Family Medicine

## 2014-04-26 ENCOUNTER — Ambulatory Visit (INDEPENDENT_AMBULATORY_CARE_PROVIDER_SITE_OTHER): Payer: No Typology Code available for payment source | Admitting: Family Medicine

## 2014-04-26 VITALS — BP 130/80 | HR 84 | Temp 98.2°F | Ht 65.0 in | Wt 187.0 lb

## 2014-04-26 DIAGNOSIS — Z0000 Encounter for general adult medical examination without abnormal findings: Secondary | ICD-10-CM

## 2014-04-26 DIAGNOSIS — R300 Dysuria: Secondary | ICD-10-CM

## 2014-04-26 DIAGNOSIS — E669 Obesity, unspecified: Secondary | ICD-10-CM | POA: Insufficient documentation

## 2014-04-26 LAB — POCT URINALYSIS DIPSTICK
Bilirubin, UA: NEGATIVE
Blood, UA: NEGATIVE
Glucose, UA: NEGATIVE
Ketones, UA: NEGATIVE
Leukocytes, UA: NEGATIVE
Nitrite, UA: NEGATIVE
Spec Grav, UA: 1.02
Urobilinogen, UA: 1
pH, UA: 7.5

## 2014-04-26 NOTE — Progress Notes (Signed)
Pre visit review using our clinic review tool, if applicable. No additional management support is needed unless otherwise documented below in the visit note. 

## 2014-04-26 NOTE — Progress Notes (Signed)
   Subjective:    Patient ID: Jerry Kramer, male    DOB: 04-Aug-1955, 58 y.o.   MRN: [id number]  HPI Patient seen for complete physical. He is generally very healthy. Never smoked. Takes no medications. He had colonoscopy 2008. Tetanus is up-to-date. Flu vaccine last week. He has no specific complaints at this time. He did have family history of CAD in mother and brother but they were both smokers.  Past Medical History  Diagnosis Date  . Hyperlipidemia   . Colon polyps   . HYPERLIPIDEMIA 08/27/2009  . CONTACT DERMATITIS 09/03/2009  . UNSPECIFIED OTALGIA 06/19/2010  . ELEVATED BLOOD PRESSURE 06/19/2010   Past Surgical History  Procedure Laterality Date  . Appendectomy  1983  . Tonsillectomy  1965    reports that he has never smoked. He has never used smokeless tobacco. He reports that he does not drink alcohol or use illicit drugs. family history includes Cancer in his mother; Diabetes in his sister; Heart attack (age of onset: 69) in his brother; Heart disease (age of onset: 49) in his brother and mother; Hyperlipidemia in his father; Stroke in his father. Allergies  Allergen Reactions  . Advil [Ibuprofen]     Caused heart to race  . Meperidine Hcl     REACTION: unspecified  . Penicillins     REACTION: unspecified      Review of Systems  Constitutional: Negative for fever, activity change, appetite change and fatigue.  HENT: Negative for congestion, ear pain and trouble swallowing.   Eyes: Negative for pain and visual disturbance.  Respiratory: Negative for cough, shortness of breath and wheezing.   Cardiovascular: Negative for chest pain and palpitations.  Gastrointestinal: Negative for nausea, vomiting, abdominal pain, diarrhea, constipation, blood in stool, abdominal distention and rectal pain.  Genitourinary: Negative for dysuria, hematuria and testicular pain.  Musculoskeletal: Negative for arthralgias and joint swelling.  Skin: Negative for rash.  Neurological: Negative  for dizziness, syncope and headaches.  Hematological: Negative for adenopathy.  Psychiatric/Behavioral: Negative for confusion and dysphoric mood.       Objective:   Physical Exam  Constitutional: He is oriented to person, place, and time. He appears well-developed and well-nourished. No distress.  HENT:  Head: Normocephalic and atraumatic.  Right Ear: External ear normal.  Left Ear: External ear normal.  Mouth/Throat: Oropharynx is clear and moist.  Eyes: Conjunctivae and EOM are normal. Pupils are equal, round, and reactive to light.  Neck: Normal range of motion. Neck supple. No thyromegaly present.  Cardiovascular: Normal rate, regular rhythm and normal heart sounds.   No murmur heard. Pulmonary/Chest: No respiratory distress. He has no wheezes. He has no rales.  Abdominal: Soft. Bowel sounds are normal. He exhibits no distension and no mass. There is no tenderness. There is no rebound and no guarding.  Musculoskeletal: He exhibits no edema.  Lymphadenopathy:    He has no cervical adenopathy.  Neurological: He is alert and oriented to person, place, and time. He displays normal reflexes. No cranial nerve deficit.  Skin: No rash noted.  Psychiatric: He has a normal mood and affect.          Assessment & Plan:  Complete physical. Colonoscopy 7 years ago. Hemoccult cards given. We reviewed labs. Trace blood on dipstick and repeat urine today reveals no evidence for blood. He has not seen any gross hematuria. He was given flu vaccine already. Consider baby aspirin daily. He is working on losing some weight and is recently started exercise program.

## 2015-06-18 ENCOUNTER — Other Ambulatory Visit (INDEPENDENT_AMBULATORY_CARE_PROVIDER_SITE_OTHER): Payer: Managed Care, Other (non HMO)

## 2015-06-18 DIAGNOSIS — Z0000 Encounter for general adult medical examination without abnormal findings: Secondary | ICD-10-CM

## 2015-06-18 LAB — BASIC METABOLIC PANEL
BUN: 17 mg/dL (ref 6–23)
CO2: 27 mEq/L (ref 19–32)
Calcium: 10.2 mg/dL (ref 8.4–10.5)
Chloride: 105 mEq/L (ref 96–112)
Creatinine, Ser: 0.97 mg/dL (ref 0.40–1.50)
GFR: 84.4 mL/min (ref >60.00)
Glucose, Bld: 101 mg/dL — ABNORMAL HIGH (ref 70–99)
Potassium: 4.6 mEq/L (ref 3.5–5.1)
Sodium: 141 mEq/L (ref 135–145)

## 2015-06-18 LAB — CBC WITH DIFFERENTIAL/PLATELET
Basophils Absolute: 0 10*3/uL (ref 0.0–0.1)
Basophils Relative: 0.6 % (ref 0.0–3.0)
Eosinophils Absolute: 0.4 10*3/uL (ref 0.0–0.7)
Eosinophils Relative: 6.2 % — ABNORMAL HIGH (ref 0.0–5.0)
HCT: 48.6 % (ref 39.0–52.0)
Hemoglobin: 16.4 g/dL (ref 13.0–17.0)
Lymphocytes Relative: 20.2 % (ref 12.0–46.0)
Lymphs Abs: 1.2 10*3/uL (ref 0.7–4.0)
MCHC: 33.8 g/dL (ref 30.0–36.0)
MCV: 90.9 fl (ref 78.0–100.0)
Monocytes Absolute: 0.5 10*3/uL (ref 0.1–1.0)
Monocytes Relative: 8.4 % (ref 3.0–12.0)
Neutro Abs: 3.9 10*3/uL (ref 1.4–7.7)
Neutrophils Relative %: 64.6 % (ref 43.0–77.0)
Platelets: 216 10*3/uL (ref 150.0–400.0)
RBC: 5.35 Mil/uL (ref 4.22–5.81)
RDW: 13 % (ref 11.5–15.5)
WBC: 6.1 10*3/uL (ref 4.0–10.5)

## 2015-06-18 LAB — HEPATIC FUNCTION PANEL
ALT: 25 U/L (ref 0–53)
AST: 21 U/L (ref 0–37)
Albumin: 4.7 g/dL (ref 3.5–5.2)
Alkaline Phosphatase: 79 U/L (ref 39–117)
Bilirubin, Direct: 0.1 mg/dL (ref 0.0–0.3)
Total Bilirubin: 0.7 mg/dL (ref 0.2–1.2)
Total Protein: 7 g/dL (ref 6.0–8.3)

## 2015-06-18 LAB — LIPID PANEL
Cholesterol: 174 mg/dL (ref 0–200)
HDL: 40 mg/dL (ref >39.00)
LDL Cholesterol: 107 mg/dL — ABNORMAL HIGH (ref 0–99)
NonHDL: 133.66
Total CHOL/HDL Ratio: 4
Triglycerides: 132 mg/dL (ref 0.0–149.0)
VLDL: 26.4 mg/dL (ref 0.0–40.0)

## 2015-06-18 LAB — PSA: PSA: 0.74 ng/mL (ref 0.10–4.00)

## 2015-06-18 LAB — TSH: TSH: 1.74 u[IU]/mL (ref 0.35–4.50)

## 2015-06-26 ENCOUNTER — Encounter: Payer: Self-pay | Admitting: Family Medicine

## 2015-06-26 ENCOUNTER — Ambulatory Visit (INDEPENDENT_AMBULATORY_CARE_PROVIDER_SITE_OTHER): Payer: Managed Care, Other (non HMO) | Admitting: Family Medicine

## 2015-06-26 VITALS — BP 120/90 | HR 85 | Temp 98.0°F | Ht 65.25 in | Wt 186.2 lb

## 2015-06-26 DIAGNOSIS — Z0000 Encounter for general adult medical examination without abnormal findings: Secondary | ICD-10-CM

## 2015-06-26 NOTE — Progress Notes (Signed)
   Subjective:    Patient ID: Jerry Kramer, male    DOB: [birth year], 59 y.o.   MRN: [id number]  HPI Patient here for complete physical Generally very healthy. He has mild dyslipidemia. Borderline elevated blood pressure in the past. Takes no regular medications. Occasional baby aspirin. Tetanus is up-to-date. He had colonoscopy 2008 which was normal. Never smoked  Does have family history of premature CAD mother as well as brother but they were both smokers. Patient is not had any recent chest pains or exertional dyspnea or other symptoms  Past Medical History  Diagnosis Date  . Hyperlipidemia   . Colon polyps   . HYPERLIPIDEMIA 08/27/2009  . CONTACT DERMATITIS 09/03/2009  . UNSPECIFIED OTALGIA 06/19/2010  . ELEVATED BLOOD PRESSURE 06/19/2010   Past Surgical History  Procedure Laterality Date  . Appendectomy  1983  . Tonsillectomy  1965    reports that he has never smoked. He has never used smokeless tobacco. He reports that he does not drink alcohol or use illicit drugs. family history includes Cancer in his mother; Diabetes in his sister; Heart attack (age of onset: 39) in his brother; Heart disease (age of onset: 13) in his brother and mother; Hyperlipidemia in his father; Stroke in his father. Allergies  Allergen Reactions  . Advil [Ibuprofen]     Caused heart to race  . Meperidine Hcl     REACTION: unspecified  . Penicillins     REACTION: unspecified      Review of Systems  Constitutional: Negative for fever, activity change, appetite change and fatigue.  HENT: Negative for congestion, ear pain and trouble swallowing.   Eyes: Negative for pain and visual disturbance.  Respiratory: Negative for cough, shortness of breath and wheezing.   Cardiovascular: Negative for chest pain and palpitations.  Gastrointestinal: Negative for nausea, vomiting, abdominal pain, diarrhea, constipation, blood in stool, abdominal distention and rectal pain.  Genitourinary: Negative for  dysuria, hematuria and testicular pain.  Musculoskeletal: Negative for joint swelling and arthralgias.  Skin: Negative for rash.  Neurological: Negative for dizziness, syncope and headaches.  Hematological: Negative for adenopathy.  Psychiatric/Behavioral: Negative for confusion and dysphoric mood.       Objective:   Physical Exam  Constitutional: He is oriented to person, place, and time. He appears well-developed and well-nourished. No distress.  HENT:  Head: Normocephalic and atraumatic.  Right Ear: External ear normal.  Left Ear: External ear normal.  Mouth/Throat: Oropharynx is clear and moist.  Eyes: Conjunctivae and EOM are normal. Pupils are equal, round, and reactive to light.  Neck: Normal range of motion. Neck supple. No thyromegaly present.  Cardiovascular: Normal rate, regular rhythm and normal heart sounds.   No murmur heard. Pulmonary/Chest: No respiratory distress. He has no wheezes. He has no rales.  Abdominal: Soft. Bowel sounds are normal. He exhibits no distension and no mass. There is no tenderness. There is no rebound and no guarding.  Musculoskeletal: He exhibits no edema.  Lymphadenopathy:    He has no cervical adenopathy.  Neurological: He is alert and oriented to person, place, and time. He displays normal reflexes. No cranial nerve deficit.  Skin: No rash noted.  Psychiatric: He has a normal mood and affect.          Assessment & Plan:  Physical exam. Healthy 59 year old male. Repeat colonoscopy in 2 years. Flu vaccine already given. Labs reviewed. Mildly elevated lipids. Patient not interested in statin therapy. Work on weight loss.

## 2015-06-26 NOTE — Progress Notes (Signed)
Pre visit review using our clinic review tool, if applicable. No additional management support is needed unless otherwise documented below in the visit note. 

## 2016-09-08 ENCOUNTER — Other Ambulatory Visit (INDEPENDENT_AMBULATORY_CARE_PROVIDER_SITE_OTHER): Payer: Managed Care, Other (non HMO)

## 2016-09-08 DIAGNOSIS — Z0000 Encounter for general adult medical examination without abnormal findings: Secondary | ICD-10-CM | POA: Diagnosis not present

## 2016-09-08 LAB — HEPATIC FUNCTION PANEL
ALT: 24 U/L (ref 0–53)
AST: 25 U/L (ref 0–37)
Albumin: 4.5 g/dL (ref 3.5–5.2)
Alkaline Phosphatase: 74 U/L (ref 39–117)
Bilirubin, Direct: 0.2 mg/dL (ref 0.0–0.3)
Total Bilirubin: 0.8 mg/dL (ref 0.2–1.2)
Total Protein: 6.9 g/dL (ref 6.0–8.3)

## 2016-09-08 LAB — CBC WITH DIFFERENTIAL/PLATELET
Basophils Absolute: 0 10*3/uL (ref 0.0–0.1)
Basophils Relative: 0.5 % (ref 0.0–3.0)
Eosinophils Absolute: 0.4 10*3/uL (ref 0.0–0.7)
Eosinophils Relative: 6.6 % — ABNORMAL HIGH (ref 0.0–5.0)
HCT: 45.6 % (ref 39.0–52.0)
Hemoglobin: 15.5 g/dL (ref 13.0–17.0)
Lymphocytes Relative: 22.7 % (ref 12.0–46.0)
Lymphs Abs: 1.3 10*3/uL (ref 0.7–4.0)
MCHC: 34 g/dL (ref 30.0–36.0)
MCV: 90.4 fl (ref 78.0–100.0)
Monocytes Absolute: 0.7 10*3/uL (ref 0.1–1.0)
Monocytes Relative: 11.2 % (ref 3.0–12.0)
Neutro Abs: 3.5 10*3/uL (ref 1.4–7.7)
Neutrophils Relative %: 59 % (ref 43.0–77.0)
Platelets: 231 10*3/uL (ref 150.0–400.0)
RBC: 5.04 Mil/uL (ref 4.22–5.81)
RDW: 12.8 % (ref 11.5–15.5)
WBC: 5.9 10*3/uL (ref 4.0–10.5)

## 2016-09-08 LAB — LIPID PANEL
Cholesterol: 156 mg/dL (ref 0–200)
HDL: 35.1 mg/dL — ABNORMAL LOW (ref >39.00)
LDL Cholesterol: 105 mg/dL — ABNORMAL HIGH (ref 0–99)
NonHDL: 120.48
Total CHOL/HDL Ratio: 4
Triglycerides: 78 mg/dL (ref 0.0–149.0)
VLDL: 15.6 mg/dL (ref 0.0–40.0)

## 2016-09-08 LAB — BASIC METABOLIC PANEL
BUN: 17 mg/dL (ref 6–23)
CO2: 25 mEq/L (ref 19–32)
Calcium: 10 mg/dL (ref 8.4–10.5)
Chloride: 105 mEq/L (ref 96–112)
Creatinine, Ser: 1.09 mg/dL (ref 0.40–1.50)
GFR: 73.46 mL/min (ref >60.00)
Glucose, Bld: 104 mg/dL — ABNORMAL HIGH (ref 70–99)
Potassium: 4.6 mEq/L (ref 3.5–5.1)
Sodium: 140 mEq/L (ref 135–145)

## 2016-09-08 LAB — PSA: PSA: 0.81 ng/mL (ref 0.10–4.00)

## 2016-09-08 LAB — TSH: TSH: 1.29 u[IU]/mL (ref 0.35–4.50)

## 2016-09-15 ENCOUNTER — Encounter: Payer: Self-pay | Admitting: Family Medicine

## 2016-09-15 ENCOUNTER — Ambulatory Visit (INDEPENDENT_AMBULATORY_CARE_PROVIDER_SITE_OTHER): Payer: Managed Care, Other (non HMO) | Admitting: Family Medicine

## 2016-09-15 VITALS — BP 124/84 | HR 70 | Temp 98.3°F | Ht 67.25 in | Wt 191.2 lb

## 2016-09-15 DIAGNOSIS — Z0000 Encounter for general adult medical examination without abnormal findings: Secondary | ICD-10-CM

## 2016-09-15 DIAGNOSIS — Z23 Encounter for immunization: Secondary | ICD-10-CM | POA: Diagnosis not present

## 2016-09-15 NOTE — Progress Notes (Signed)
Pre visit review using our clinic review tool, if applicable. No additional management support is needed unless otherwise documented below in the visit note. 

## 2016-09-15 NOTE — Progress Notes (Signed)
Subjective:     Patient ID: Jerry Kramer, male   DOB: 1955-07-27, 61 y.o.   MRN: [id number]  HPI Patient's seen for physical exam. He takes no medications. He has history of mild hyperlipidemia. He has battled with weight issues for some time but current weight is relatively stable. He has history of mildly elevated glucose in the past. No polyuria or polydipsia. He will be due for repeat colonoscopy this August. Needs tetanus booster. Otherwise, immunizations up-to-date. Nonsmoker and never smoked.  Past Medical History:  Diagnosis Date  . Colon polyps   . CONTACT DERMATITIS 09/03/2009  . ELEVATED BLOOD PRESSURE 06/19/2010  . Hyperlipidemia   . HYPERLIPIDEMIA 08/27/2009  . UNSPECIFIED OTALGIA 06/19/2010   Past Surgical History:  Procedure Laterality Date  . APPENDECTOMY  1983  . TONSILLECTOMY  1965    reports that he has never smoked. He has never used smokeless tobacco. He reports that he does not drink alcohol or use drugs. family history includes Cancer in his mother; Diabetes in his sister; Heart attack (age of onset: 48) in his brother; Heart disease (age of onset: 26) in his brother and mother; Hyperlipidemia in his father; Stroke in his father. Allergies  Allergen Reactions  . Advil [Ibuprofen]     Caused heart to race  . Meperidine Hcl     REACTION: unspecified  . Penicillins     REACTION: unspecified     Review of Systems  Constitutional: Negative for activity change, appetite change, fatigue, fever and unexpected weight change.  HENT: Negative for congestion, ear pain and trouble swallowing.   Eyes: Negative for pain and visual disturbance.  Respiratory: Negative for cough, shortness of breath and wheezing.   Cardiovascular: Negative for chest pain and palpitations.  Gastrointestinal: Negative for abdominal distention, abdominal pain, blood in stool, constipation, diarrhea, nausea, rectal pain and vomiting.  Endocrine: Negative for polydipsia and polyuria.   Genitourinary: Negative for dysuria, hematuria and testicular pain.  Musculoskeletal: Negative for arthralgias and joint swelling.  Skin: Negative for rash.  Neurological: Negative for dizziness, syncope and headaches.  Hematological: Negative for adenopathy.  Psychiatric/Behavioral: Negative for confusion and dysphoric mood.       Objective:   Physical Exam  Constitutional: He is oriented to person, place, and time. He appears well-developed and well-nourished. No distress.  HENT:  Head: Normocephalic and atraumatic.  Right Ear: External ear normal.  Left Ear: External ear normal.  Mouth/Throat: Oropharynx is clear and moist.  Eyes: Conjunctivae and EOM are normal. Pupils are equal, round, and reactive to light.  Neck: Normal range of motion. Neck supple. No thyromegaly present.  Cardiovascular: Normal rate, regular rhythm and normal heart sounds.   No murmur heard. Pulmonary/Chest: No respiratory distress. He has no wheezes. He has no rales.  Abdominal: Soft. Bowel sounds are normal. He exhibits no distension and no mass. There is no tenderness. There is no rebound and no guarding.  Musculoskeletal: He exhibits no edema.  Lymphadenopathy:    He has no cervical adenopathy.  Neurological: He is alert and oriented to person, place, and time. He displays normal reflexes. No cranial nerve deficit.  Skin: No rash noted.  Psychiatric: He has a normal mood and affect.       Assessment:     Physical exam. Labs reviewed with no major concerns. Minimally elevated glucose of 104    Plan:     -Tetanus booster given -Encouraged to lose some weight and follow low glycemic diet -Repeat colonoscopy by this  August. He will call back with preference for provider  Eulas Post MD [facility]

## 2016-09-15 NOTE — Patient Instructions (Signed)
Remember to get follow up colonoscopy by this Fall Let me know which GI physician you would like to see.Marland Kitchen

## 2016-12-27 ENCOUNTER — Encounter (HOSPITAL_BASED_OUTPATIENT_CLINIC_OR_DEPARTMENT_OTHER): Payer: Self-pay | Admitting: *Deleted

## 2016-12-27 ENCOUNTER — Encounter (HOSPITAL_COMMUNITY): Payer: Self-pay | Admitting: Family Medicine

## 2016-12-27 ENCOUNTER — Emergency Department (HOSPITAL_BASED_OUTPATIENT_CLINIC_OR_DEPARTMENT_OTHER)
Admission: EM | Admit: 2016-12-27 | Discharge: 2016-12-27 | Disposition: A | Discharge status: Home / Self Care | Payer: Managed Care, Other (non HMO) | Attending: Emergency Medicine | Admitting: Emergency Medicine

## 2016-12-27 ENCOUNTER — Emergency Department (HOSPITAL_BASED_OUTPATIENT_CLINIC_OR_DEPARTMENT_OTHER): Payer: Managed Care, Other (non HMO)

## 2016-12-27 ENCOUNTER — Ambulatory Visit (HOSPITAL_COMMUNITY)
Admission: EM | Admit: 2016-12-27 | Discharge: 2016-12-27 | Disposition: A | Discharge status: Nursing Home (Includes ICF) | Payer: Managed Care, Other (non HMO) | Attending: Internal Medicine | Admitting: Internal Medicine

## 2016-12-27 DIAGNOSIS — R110 Nausea: Secondary | ICD-10-CM

## 2016-12-27 DIAGNOSIS — R109 Unspecified abdominal pain: Secondary | ICD-10-CM

## 2016-12-27 DIAGNOSIS — Z7982 Long term (current) use of aspirin: Secondary | ICD-10-CM | POA: Insufficient documentation

## 2016-12-27 DIAGNOSIS — N200 Calculus of kidney: Secondary | ICD-10-CM | POA: Insufficient documentation

## 2016-12-27 DIAGNOSIS — R1031 Right lower quadrant pain: Secondary | ICD-10-CM | POA: Diagnosis present

## 2016-12-27 LAB — POCT URINALYSIS DIP (DEVICE)
Bilirubin Urine: NEGATIVE
Glucose, UA: NEGATIVE mg/dL
Leukocytes, UA: NEGATIVE
Nitrite: NEGATIVE
Protein, ur: 30 mg/dL — AB
Specific Gravity, Urine: >=1.03 (ref 1.005–1.030)
Urobilinogen, UA: 0.2 mg/dL (ref 0.0–1.0)
pH: 5.5 (ref 5.0–8.0)

## 2016-12-27 LAB — CBC WITH DIFFERENTIAL/PLATELET
Basophils Absolute: 0 10*3/uL (ref 0.0–0.1)
Basophils Relative: 0 %
Eosinophils Absolute: 0.1 10*3/uL (ref 0.0–0.7)
Eosinophils Relative: 1 %
HCT: 45.3 % (ref 39.0–52.0)
Hemoglobin: 15.7 g/dL (ref 13.0–17.0)
Lymphocytes Relative: 10 %
Lymphs Abs: 0.8 10*3/uL (ref 0.7–4.0)
MCH: 31.2 pg (ref 26.0–34.0)
MCHC: 34.7 g/dL (ref 30.0–36.0)
MCV: 89.9 fL (ref 78.0–100.0)
Monocytes Absolute: 0.3 10*3/uL (ref 0.1–1.0)
Monocytes Relative: 4 %
Neutro Abs: 7.1 10*3/uL (ref 1.7–7.7)
Neutrophils Relative %: 85 %
Platelets: 212 10*3/uL (ref 150–400)
RBC: 5.04 MIL/uL (ref 4.22–5.81)
RDW: 12.9 % (ref 11.5–15.5)
WBC: 8.3 10*3/uL (ref 4.0–10.5)

## 2016-12-27 LAB — URINALYSIS, MICROSCOPIC (REFLEX): WBC, UA: NONE SEEN WBC/hpf (ref 0–5)

## 2016-12-27 LAB — URINALYSIS, ROUTINE W REFLEX MICROSCOPIC
Bilirubin Urine: NEGATIVE
Glucose, UA: NEGATIVE mg/dL
Ketones, ur: 15 mg/dL — AB
Leukocytes, UA: NEGATIVE
Nitrite: NEGATIVE
Protein, ur: NEGATIVE mg/dL
Specific Gravity, Urine: 1.024 (ref 1.005–1.030)
pH: 5 (ref 5.0–8.0)

## 2016-12-27 LAB — BASIC METABOLIC PANEL
Anion gap: 7 (ref 5–15)
BUN: 14 mg/dL (ref 6–20)
CO2: 24 mmol/L (ref 22–32)
Calcium: 9.4 mg/dL (ref 8.9–10.3)
Chloride: 107 mmol/L (ref 101–111)
Creatinine, Ser: 0.9 mg/dL (ref 0.61–1.24)
GFR calc Af Amer: >60 mL/min (ref >60)
GFR calc non Af Amer: >60 mL/min (ref >60)
Glucose, Bld: 112 mg/dL — ABNORMAL HIGH (ref 65–99)
Potassium: 4 mmol/L (ref 3.5–5.1)
Sodium: 138 mmol/L (ref 135–145)

## 2016-12-27 MED ORDER — ONDANSETRON 8 MG PO TBDP: 8.0000 mg | ORAL_TABLET | Freq: Three times a day (TID) | ORAL | 0 refills | Status: DC | PRN

## 2016-12-27 MED ORDER — TAMSULOSIN HCL 0.4 MG PO CAPS: 0.4000 mg | ORAL_CAPSULE | Freq: Every day | ORAL | 0 refills | Status: DC

## 2016-12-27 MED ORDER — OXYCODONE HCL 5 MG PO TABS: 5.0000 mg | ORAL_TABLET | ORAL | 0 refills | Status: DC | PRN

## 2016-12-27 MED ORDER — ONDANSETRON HCL 4 MG/2ML IJ SOLN
4.0000 mg | Freq: Once | INTRAMUSCULAR | Status: AC
  Administered 2016-12-27: 4 mg via INTRAVENOUS
  Filled 2016-12-27: qty 2

## 2016-12-27 MED ORDER — MORPHINE SULFATE (PF) 4 MG/ML IV SOLN
4.0000 mg | Freq: Once | INTRAVENOUS | Status: AC
  Administered 2016-12-27: 4 mg via INTRAVENOUS
  Filled 2016-12-27: qty 1

## 2016-12-27 MED FILL — TAMSULOSIN HCL 0.4 MG CAP: 0.4 | 30 days supply | Qty: 30 | Fill #0

## 2016-12-27 MED FILL — ONDANSETRON ODT 8 MG TABLET: 8 | 3 days supply | Qty: 8 | Fill #0

## 2016-12-27 MED FILL — oxyCODONE HCL 5 MG TABS: 5 | 4 days supply | Qty: 20 | Fill #0

## 2016-12-27 NOTE — ED Provider Notes (Signed)
CSN: [id number]     Arrival date & time 12/27/16  1001 History   None    Chief Complaint  Patient presents with  . Abdominal Pain   (Consider location/radiation/quality/duration/timing/severity/associated sxs/prior Treatment) 61 yr old white male present sto UC with sudden onset right flank/right abdominal pain since 0800, waxing and waning, vomited x 1. Pt last ate cereal at 0700 and went to work. Rates pain as 6/10 at present.    The history is provided by the patient and a relative. No language interpreter was used.  Abdominal Pain  Pain location:  R flank, RUQ and RLQ Pain quality: cramping, sharp and shooting   Pain radiation: right side. Pain severity:  Moderate Onset quality:  Sudden Duration:  3 hours Timing:  Constant Progression:  Waxing and waning Chronicity:  New Context: not sick contacts and not trauma   Relieved by:  Nothing Worsened by:  Nothing Ineffective treatments:  None tried Associated symptoms: fever, nausea and vomiting     Past Medical History:  Diagnosis Date  . Colon polyps   . CONTACT DERMATITIS 09/03/2009  . ELEVATED BLOOD PRESSURE 06/19/2010  . Hyperlipidemia   . HYPERLIPIDEMIA 08/27/2009  . UNSPECIFIED OTALGIA 06/19/2010   Past Surgical History:  Procedure Laterality Date  . APPENDECTOMY  1983  . TONSILLECTOMY  1965   Family History  Problem Relation Age of Onset  . Heart disease Mother 66       CAD  . Cancer Mother        lung  . Hyperlipidemia Father   . Stroke Father   . Diabetes Sister        type II  . Heart attack Brother 79  . Heart disease Brother 34       MI   Social History  Substance Use Topics  . Smoking status: Never Smoker  . Smokeless tobacco: Never Used  . Alcohol use No    Review of Systems  Constitutional: Positive for fever.  Gastrointestinal: Positive for abdominal pain, nausea and vomiting.  Genitourinary: Positive for flank pain.  All other systems reviewed and are negative.   Allergies  Advil  [ibuprofen]; Meperidine hcl; and Penicillins  Home Medications   Prior to Admission medications   Medication Sig Start Date End Date Taking? Authorizing Provider  aspirin 81 MG tablet Take 81 mg by mouth daily.    [provider]   Meds Ordered and Administered this Visit  Medications - No data to display  BP (!) 142/100   Pulse 70   Temp 99 F (37.2 C)   Resp 18   SpO2 98%  No data found.   Physical Exam  Constitutional: He is oriented to person, place, and time. He appears well-developed and well-nourished. No distress.  Eyes: Pupils are equal, round, and reactive to light.  Neck: Normal range of motion.  Cardiovascular: Normal rate, regular rhythm and normal heart sounds.   Pulmonary/Chest: Effort normal and breath sounds normal.  Abdominal: Soft. Normal appearance. He exhibits no distension. Bowel sounds are decreased. There is tenderness in the right upper quadrant and right lower quadrant. There is rebound. There is no guarding.  Musculoskeletal: Normal range of motion.  Neurological: He is alert and oriented to person, place, and time. GCS eye subscore is 4. GCS verbal subscore is 5. GCS motor subscore is 6.  Skin: Skin is warm and dry. No rash noted.  Psychiatric: He has a normal mood and affect. His speech is normal and behavior is normal.  Nursing note and vitals reviewed.   Urgent Care Course     Procedures (including critical care time)  Labs Review Labs Reviewed  POCT URINALYSIS DIP (DEVICE) - Abnormal; Notable for the following:       Result Value   Ketones, ur TRACE (*)    Hgb urine dipstick LARGE (*)    Protein, ur 30 (*)    All other components within normal limits       MDM   1. Nausea   2. Acute right flank pain     Go straight to Er for evaluation of acute right sided flank pain most likely kidney stone with low grade temp, worsening pain, nausea/vomiting; cannot exclude gallbladder. No food or drink until cleared by ER provider.     Tori Milks, NP 03/70/48 1109

## 2016-12-27 NOTE — Discharge Instructions (Signed)
You have been diagnosed with kidney stones.  Drink plenty of fluids to help you pass the stone.  Take  ibuprofen / naproxen as directed with food for mild to moderate pain. Use your pain medication as directed and only as needed for severe pain. Taking flomax as directed will also help to pass the stone. Use Zofran for nausea as directed.  Follow up with the urology clinic listed in regards to your hospital visit.   Return to the ED immediately if you develop fever that persists > 101, uncontrolled pain or vomiting, or other concerns.   Do not drink alcohol, drive or participate in any other potentially dangerous activities while taking opiate pain medication as it may make you sleepy. Do not take this medication with any other sedating medications, either prescription or over-the-counter. If you were prescribed Percocet or Vicodin, do not take these with acetaminophen (Tylenol) as it is already contained within these medications.   This medication is an opiate (or narcotic) pain medication and can be habit forming.  Use it as little as possible to achieve adequate pain control.  Do not use or use it with extreme caution if you have a history of opiate abuse or dependence. This medication is intended for your use only - do not give any to anyone else and keep it in a secure place where nobody else, especially children, have access to it. It will also cause or worsen constipation, so you may want to consider taking an over-the-counter stool softener while you are taking this medication.  

## 2016-12-27 NOTE — Discharge Instructions (Signed)
Go straight to Er for evaluation of acute right sided flank pain most likely kidney stone with low grade temp, worsening pain, nausea/vomiting; cannot exclude gallbladder. No food or drink until cleared by ER provider.

## 2016-12-27 NOTE — ED Triage Notes (Signed)
Right flank pain since this am. Sudden onset of pain. Blood in his urine at UC this am. He was told to come here.

## 2016-12-27 NOTE — ED Triage Notes (Signed)
Pt here for RUQ pain and right flank pain that started at 8 am with N,V.

## 2016-12-27 NOTE — ED Provider Notes (Signed)
[facility] Provider Note   CSN: [id number] Arrival date & time: 12/27/16  1129     History   Chief Complaint No chief complaint on file.   HPI Jerry Kramer is a 61 y.o. male.  HPI 61 year old Caucasian male with no significant past medical history presents to the emergency Department today with complaints of right flank pain that radiates to his right lower quadrant. Pain has been there since 8:00 this morning. Describes it as sharp in nature. It is intermittent. Had episode of emesis and diaphoresis with the pain this morning which has subsided at this time. States that his pain has improved without any intervention. Was seen at urgent care for same and sent to the ED for evaluation. Patient denies any history of kidney stones. He does report hematuria, urgency, frequency. Denies any testicular pain or swelling. Reports mild chills but does not report any fevers. Nothing makes better or worse. Past Medical History:  Diagnosis Date  . Colon polyps   . CONTACT DERMATITIS 09/03/2009  . ELEVATED BLOOD PRESSURE 06/19/2010  . Hyperlipidemia   . HYPERLIPIDEMIA 08/27/2009  . UNSPECIFIED OTALGIA 06/19/2010    Patient Active Problem List   Diagnosis Date Noted  . Obesity (BMI 30-39.9) 04/26/2014  . UNSPECIFIED OTALGIA 06/19/2010  . ELEVATED BLOOD PRESSURE 06/19/2010  . CONTACT DERMATITIS 09/03/2009  . HYPERLIPIDEMIA 08/27/2009    Past Surgical History:  Procedure Laterality Date  . APPENDECTOMY  1983  . TONSILLECTOMY  1965       Home Medications    Prior to Admission medications   Medication Sig Start Date End Date Taking? Authorizing Provider  aspirin 81 MG tablet Take 81 mg by mouth daily.    [provider]  ondansetron (ZOFRAN-ODT) 8 MG disintegrating tablet Take 1 tablet (8 mg total) by mouth every 8 (eight) hours as needed for nausea. 12/27/16   Doristine Devoid, PA-C  oxyCODONE (ROXICODONE) 5 MG immediate release tablet Take 1 tablet (5 mg total)  by mouth every 4 (four) hours as needed for severe pain. 12/27/16   Doristine Devoid, PA-C  tamsulosin (FLOMAX) 0.4 MG CAPS capsule Take 1 capsule (0.4 mg total) by mouth daily. 12/27/16   Doristine Devoid, PA-C    Family History Family History  Problem Relation Age of Onset  . Heart disease Mother 57       CAD  . Cancer Mother        lung  . Hyperlipidemia Father   . Stroke Father   . Diabetes Sister        type II  . Heart attack Brother 62  . Heart disease Brother 72       MI    Social History Social History  Substance Use Topics  . Smoking status: Never Smoker  . Smokeless tobacco: Never Used  . Alcohol use No     Allergies   Advil [ibuprofen]; Meperidine hcl; and Penicillins   Review of Systems Review of Systems  Constitutional: Negative for chills and fever.  HENT: Negative for congestion and sore throat.   Eyes: Negative for visual disturbance.  Respiratory: Negative for cough and shortness of breath.   Cardiovascular: Negative for chest pain.  Gastrointestinal: Positive for abdominal pain, nausea and vomiting. Negative for diarrhea.  Genitourinary: Positive for flank pain, frequency, hematuria and urgency. Negative for dysuria, scrotal swelling and testicular pain.  Musculoskeletal: Negative for arthralgias and myalgias.  Skin: Negative for rash.  Neurological: Negative for dizziness, syncope, weakness, light-headedness,  numbness and headaches.  Psychiatric/Behavioral: Negative for sleep disturbance. The patient is not nervous/anxious.      Physical Exam Updated Vital Signs BP (!) 137/91 (BP Location: Left Arm)   Pulse 62   Temp 97.9 F (36.6 C) (Oral)   Resp 18   Ht 5\' 8"  (1.727 m)   Wt 83.9 kg (185 lb)   SpO2 98%   BMI 28.13 kg/m   Physical Exam  Constitutional: He is oriented to person, place, and time. He appears well-developed and well-nourished.  Non-toxic appearance. No distress.  HENT:  Head: Normocephalic and atraumatic.    Mouth/Throat: Oropharynx is clear and moist.  Eyes: Conjunctivae are normal. Pupils are equal, round, and reactive to light. Right eye exhibits no discharge. Left eye exhibits no discharge.  Neck: Normal range of motion. Neck supple.  Cardiovascular: Normal rate, regular rhythm, normal heart sounds and intact distal pulses.  Exam reveals no gallop and no friction rub.   No murmur heard. Pulmonary/Chest: Effort normal and breath sounds normal. No respiratory distress. He exhibits no tenderness.  Abdominal: Soft. Bowel sounds are normal. He exhibits no distension. There is tenderness in the right lower quadrant. There is CVA tenderness. There is no rigidity, no rebound and no guarding.  Musculoskeletal: Normal range of motion. He exhibits no tenderness.  Lymphadenopathy:    He has no cervical adenopathy.  Neurological: He is alert and oriented to person, place, and time.  Skin: Skin is warm and dry. Capillary refill takes less than 2 seconds. No rash noted.  Psychiatric: His behavior is normal. Judgment and thought content normal.  Nursing note and vitals reviewed.    ED Treatments / Results  Labs (all labs ordered are listed, but only abnormal results are displayed) Labs Reviewed  BASIC METABOLIC PANEL - Abnormal; Notable for the following:       Result Value   Glucose, Bld 112 (*)    All other components within normal limits  URINALYSIS, ROUTINE W REFLEX MICROSCOPIC - Abnormal; Notable for the following:    APPearance CLOUDY (*)    Hgb urine dipstick LARGE (*)    Ketones, ur 15 (*)    All other components within normal limits  URINALYSIS, MICROSCOPIC (REFLEX) - Abnormal; Notable for the following:    Bacteria, UA FEW (*)    Squamous Epithelial / LPF 0-5 (*)    All other components within normal limits  CBC WITH DIFFERENTIAL/PLATELET    EKG  EKG Interpretation None       Radiology Ct Renal Stone Study  Result Date: 12/27/2016 CLINICAL DATA:  Right flank pain since 0800  this morning. Prior appendectomy. EXAM: CT ABDOMEN AND PELVIS WITHOUT CONTRAST TECHNIQUE: Multidetector CT imaging of the abdomen and pelvis was performed following the standard protocol without IV contrast. COMPARISON:  None. FINDINGS: Lower chest: No significant pulmonary nodules or acute consolidative airspace disease. Hepatobiliary: Normal liver with no liver mass. Normal gallbladder with no radiopaque cholelithiasis. No biliary ductal dilatation. Pancreas: Normal, with no mass or duct dilation. Spleen: Normal size. No mass. Adrenals/Urinary Tract: Normal adrenals. Obstructing 2 mm distal right pelvic ureteral stone (located approximately 1.5 cm above the right ureterovesical junction), with mild right hydroureteronephrosis. Additional punctate nonobstructing interpolar right renal stone. No additional renal stones. No additional right ureteral stones. No left hydronephrosis. Normal caliber left ureter, with no left ureteral stones. No contour deforming renal masses. Collapsed and grossly normal bladder. Stomach/Bowel: Small hiatal hernia. Otherwise collapsed and grossly normal stomach. Normal caliber small bowel with  no small bowel wall thickening. Appendectomy. Mild sigmoid diverticulosis, with no large bowel wall thickening or pericolonic fat stranding. Vascular/Lymphatic: Normal caliber abdominal aorta. No pathologically enlarged lymph nodes in the abdomen or pelvis. Reproductive: Top-normal size prostate. Nonspecific coarse internal prostatic calcifications. Other: No pneumoperitoneum, ascites or focal fluid collection. Musculoskeletal: No aggressive appearing focal osseous lesions. Mild thoracolumbar spondylosis. IMPRESSION: 1. Obstructing 2 mm distal right pelvic ureteral stone, with mild right hydroureteronephrosis. Additional punctate nonobstructing interpolar right renal stone. 2. Small hiatal hernia . 3. Mild sigmoid diverticulosis. Electronically Signed   By: Ilona Sorrel M.D.   On: 12/27/2016 13:31      Procedures Procedures (including critical care time)  Medications Ordered in ED Medications  morphine 4 MG/ML injection 4 mg (4 mg Intravenous Given 12/27/16 1344)  ondansetron (ZOFRAN) injection 4 mg (4 mg Intravenous Given 12/27/16 1343)     Initial Impression / Assessment and Plan / ED Course  I have reviewed the triage vital signs and the nursing notes.  Pertinent labs & imaging results that were available during my care of the patient were reviewed by me and considered in my medical decision making (see chart for details).     Pt has been diagnosed with a Kidney Stone via CT. There is no evidence of significant hydronephrosis, serum creatine WNL, vitals sign stable and the pt does not have irratractable vomiting. Cannot take Toradol. UA shows no signs of infection. Pt will be dc home with pain medications & has been advised to follow up with urology.    Final Clinical Impressions(s) / ED Diagnoses   Final diagnoses:  Nephrolithiasis    New Prescriptions Discharge Medication List as of 12/27/2016  2:55 PM    START taking these medications   Details  ondansetron (ZOFRAN-ODT) 8 MG disintegrating tablet Take 1 tablet (8 mg total) by mouth every 8 (eight) hours as needed for nausea., Starting Mon 12/27/2016, Print    oxyCODONE (ROXICODONE) 5 MG immediate release tablet Take 1 tablet (5 mg total) by mouth every 4 (four) hours as needed for severe pain., Starting Mon 12/27/2016, Print    tamsulosin (FLOMAX) 0.4 MG CAPS capsule Take 1 capsule (0.4 mg total) by mouth daily., Starting Mon 12/27/2016, Print         Doristine Devoid, PA-C 12/27/16 Ward Chatters    Orlie Dakin, MD 12/29/16 [phone]

## 2017-03-17 ENCOUNTER — Encounter: Payer: Self-pay | Admitting: Family Medicine

## 2017-07-20 ENCOUNTER — Encounter: Payer: Self-pay | Admitting: Internal Medicine

## 2017-09-05 ENCOUNTER — Other Ambulatory Visit: Payer: Self-pay

## 2017-09-05 ENCOUNTER — Ambulatory Visit (AMBULATORY_SURGERY_CENTER): Payer: Self-pay

## 2017-09-05 VITALS — Ht 67.0 in | Wt 194.0 lb

## 2017-09-05 DIAGNOSIS — Z86010 Personal history of colonic polyps: Secondary | ICD-10-CM

## 2017-09-05 MED ORDER — NA SULFATE-K SULFATE-MG SULF 17.5-3.13-1.6 GM/177ML PO SOLN: 1.0000 | Freq: Once | ORAL | 0 refills | Status: AC

## 2017-09-05 NOTE — Progress Notes (Signed)
Denies allergies to eggs or soy products. Denies complication of anesthesia or sedation. Denies use of weight loss medication. Denies use of O2.   Emmi instructions declined.  

## 2017-09-07 ENCOUNTER — Encounter: Payer: Self-pay | Admitting: Internal Medicine

## 2017-09-19 ENCOUNTER — Ambulatory Visit (AMBULATORY_SURGERY_CENTER): Payer: 59 | Admitting: Internal Medicine

## 2017-09-19 ENCOUNTER — Other Ambulatory Visit: Payer: Self-pay

## 2017-09-19 ENCOUNTER — Encounter: Payer: Self-pay | Admitting: Internal Medicine

## 2017-09-19 VITALS — BP 116/75 | HR 70 | Temp 98.2°F | Resp 13 | Ht 67.0 in | Wt 194.0 lb

## 2017-09-19 DIAGNOSIS — D123 Benign neoplasm of transverse colon: Secondary | ICD-10-CM

## 2017-09-19 DIAGNOSIS — Z1211 Encounter for screening for malignant neoplasm of colon: Secondary | ICD-10-CM

## 2017-09-19 MED ORDER — SODIUM CHLORIDE 0.9 % IV SOLN: 500.0000 mL | Freq: Once | INTRAVENOUS | Status: DC

## 2017-09-19 NOTE — Progress Notes (Signed)
Called to room to assist during endoscopic procedure.  Patient ID and intended procedure confirmed with present staff. Received instructions for my participation in the procedure from the performing physician.  

## 2017-09-19 NOTE — Progress Notes (Signed)
Pt's states no medical or surgical changes since previsit or office visit. 

## 2017-09-19 NOTE — Patient Instructions (Signed)
Information on polyps and diverticulosis.  YOU HAD AN ENDOSCOPIC PROCEDURE TODAY AT [facility]:   Refer to the procedure report that was given to you for any specific questions about what was found during the examination.  If the procedure report does not answer your questions, please call your gastroenterologist to clarify.  If you requested that your care partner not be given the details of your procedure findings, then the procedure report has been included in a sealed envelope for you to review at your convenience later.  YOU SHOULD EXPECT: Some feelings of bloating in the abdomen. Passage of more gas than usual.  Walking can help get rid of the air that was put into your GI tract during the procedure and reduce the bloating. If you had a lower endoscopy (such as a colonoscopy or flexible sigmoidoscopy) you may notice spotting of blood in your stool or on the toilet paper. If you underwent a bowel prep for your procedure, you may not have a normal bowel movement for a few days.  Please Note:  You might notice some irritation and congestion in your nose or some drainage.  This is from the oxygen used during your procedure.  There is no need for concern and it should clear up in a day or so.  SYMPTOMS TO REPORT IMMEDIATELY:   Following lower endoscopy (colonoscopy or flexible sigmoidoscopy):  Excessive amounts of blood in the stool  Significant tenderness or worsening of abdominal pains  Swelling of the abdomen that is new, acute  Fever of 100F or higher  For urgent or emergent issues, a gastroenterologist can be reached at any hour by calling [phone].   DIET:  We do recommend a small meal at first, but then you may proceed to your regular diet.  Drink plenty of fluids but you should avoid alcoholic beverages for 24 hours.  ACTIVITY:  You should plan to take it easy for the rest of today and you should NOT DRIVE or use heavy machinery until tomorrow (because of the  sedation medicines used during the test).    FOLLOW UP: Our staff will call the number listed on your records the next business day following your procedure to check on you and address any questions or concerns that you may have regarding the information given to you following your procedure. If we do not reach you, we will leave a message.  However, if you are feeling well and you are not experiencing any problems, there is no need to return our call.  We will assume that you have returned to your regular daily activities without incident.  If any biopsies were taken you will be contacted by phone or by letter within the next 1-3 weeks.  Please call us at [phone] if you have not heard about the biopsies in 3 weeks.    SIGNATURES/CONFIDENTIALITY: You and/or your care partner have signed paperwork which will be entered into your electronic medical record.  These signatures attest to the fact that that the information above on your After Visit Summary has been reviewed and is understood.  Full responsibility of the confidentiality of this discharge information lies with you and/or your care-partner.

## 2017-09-19 NOTE — Op Note (Signed)
[place] Patient Name: Jerry Kramer Procedure Date: 09/19/2017 7:00 AM MRN: [id number] Endoscopist: Jerene Bears , MD Age: 62 Referring MD:  Date of Birth: [birth year] Gender: Male Account #: [id number] Procedure:                Colonoscopy Indications:              Screening for colorectal malignant neoplasm, Last                            colonoscopy 10 years ago Medicines:                Monitored Anesthesia Care Procedure:                Pre-Anesthesia Assessment:                           - Prior to the procedure, a History and Physical                            was performed, and patient medications and                            allergies were reviewed. The patient's tolerance of                            previous anesthesia was also reviewed. The risks                            and benefits of the procedure and the sedation                            options and risks were discussed with the patient.                            All questions were answered, and informed consent                            was obtained. Prior Anticoagulants: The patient has                            taken no previous anticoagulant or antiplatelet                            agents. ASA Grade Assessment: II - A patient with                            mild systemic disease. After reviewing the risks                            and benefits, the patient was deemed in                            satisfactory condition to undergo the procedure.  After obtaining informed consent, the colonoscope                            was passed under direct vision. Throughout the                            procedure, the patient's blood pressure, pulse, and                            oxygen saturations were monitored continuously. The                            Colonoscope was introduced through the anus and                            advanced to the the cecum, identified by                           appendiceal orifice and ileocecal valve. The                            quality of the bowel preparation was excellent. The                            ileocecal valve, appendiceal orifice, and rectum                            were photographed. Scope In: 8:10:18 AM Scope Out: 8:24:15 AM Scope Withdrawal Time: 0 hours 10 minutes 2 seconds  Total Procedure Duration: 0 hours 13 minutes 57 seconds  Findings:                 The digital rectal exam was normal.                           A 3 mm polyp was found in the distal transverse                            colon. The polyp was sessile. The polyp was removed                            with a cold snare. Resection and retrieval were                            complete.                           A few small-mouthed diverticula were found in the                            sigmoid colon and descending colon.                           The exam was otherwise without abnormality on  direct and retroflexion views. Complications:            No immediate complications. Estimated Blood Loss:     Estimated blood loss: none. Impression:               - One 3 mm polyp in the distal transverse colon,                            removed with a cold snare. Resected and retrieved.                           - Mild diverticulosis in the sigmoid colon and in                            the descending colon.                           - The examination was otherwise normal on direct                            and retroflexion views. Recommendation:           - Patient has a contact number available for                            emergencies. The signs and symptoms of potential                            delayed complications were discussed with the                            patient. Return to normal activities tomorrow.                            Written discharge instructions were provided to the                             patient.                           - Continue present medications.                           - Resume previous diet.                           - Await pathology results.                           - Repeat colonoscopy is recommended. The                            colonoscopy date will be determined after pathology                            results from today's exam become available for  review. Jerene Bears, MD 09/19/2017 8:29:35 AM This report has been signed electronically.

## 2017-09-19 NOTE — Progress Notes (Signed)
A and O x3. Report to RN. Tolerated MAC anesthesia well.

## 2017-09-20 ENCOUNTER — Telehealth: Payer: Self-pay

## 2017-09-20 NOTE — Telephone Encounter (Signed)
  Follow up Call-  Call back number 09/19/2017  Post procedure Call Back phone  # [phone]  Permission to leave phone message Yes  Some recent data might be hidden     Patient questions:  Do you have a fever, pain , or abdominal swelling? No. Pain Score  0 *  Have you tolerated food without any problems? Yes.    Have you been able to return to your normal activities? Yes.    Do you have any questions about your discharge instructions: Diet   No. Medications  No. Follow up visit  No.  Do you have questions or concerns about your Care? No.  Actions: * If pain score is 4 or above: No action needed, pain <4.

## 2017-09-23 ENCOUNTER — Encounter: Payer: Self-pay | Admitting: Internal Medicine

## 2017-11-09 ENCOUNTER — Encounter: Payer: Self-pay | Admitting: Family Medicine

## 2017-11-09 ENCOUNTER — Ambulatory Visit (INDEPENDENT_AMBULATORY_CARE_PROVIDER_SITE_OTHER): Payer: 59 | Admitting: Family Medicine

## 2017-11-09 VITALS — BP 126/96 | HR 75 | Temp 98.3°F | Ht 66.5 in | Wt 187.6 lb

## 2017-11-09 DIAGNOSIS — Z0000 Encounter for general adult medical examination without abnormal findings: Secondary | ICD-10-CM | POA: Diagnosis not present

## 2017-11-09 LAB — CBC WITH DIFFERENTIAL/PLATELET
Basophils Absolute: 0 10*3/uL (ref 0.0–0.1)
Basophils Relative: 0.7 % (ref 0.0–3.0)
Eosinophils Absolute: 0.4 10*3/uL (ref 0.0–0.7)
Eosinophils Relative: 8.2 % — ABNORMAL HIGH (ref 0.0–5.0)
HCT: 45.7 % (ref 39.0–52.0)
Hemoglobin: 15.7 g/dL (ref 13.0–17.0)
Lymphocytes Relative: 22.2 % (ref 12.0–46.0)
Lymphs Abs: 1.1 10*3/uL (ref 0.7–4.0)
MCHC: 34.3 g/dL (ref 30.0–36.0)
MCV: 89.9 fl (ref 78.0–100.0)
Monocytes Absolute: 0.5 10*3/uL (ref 0.1–1.0)
Monocytes Relative: 9 % (ref 3.0–12.0)
Neutro Abs: 3 10*3/uL (ref 1.4–7.7)
Neutrophils Relative %: 59.9 % (ref 43.0–77.0)
Platelets: 214 10*3/uL (ref 150.0–400.0)
RBC: 5.08 Mil/uL (ref 4.22–5.81)
RDW: 13.3 % (ref 11.5–15.5)
WBC: 5.1 10*3/uL (ref 4.0–10.5)

## 2017-11-09 LAB — PSA: PSA: 1.08 ng/mL (ref 0.10–4.00)

## 2017-11-09 LAB — HEPATIC FUNCTION PANEL
ALT: 21 U/L (ref 0–53)
AST: 20 U/L (ref 0–37)
Albumin: 4.7 g/dL (ref 3.5–5.2)
Alkaline Phosphatase: 75 U/L (ref 39–117)
Bilirubin, Direct: 0.1 mg/dL (ref 0.0–0.3)
Total Bilirubin: 0.6 mg/dL (ref 0.2–1.2)
Total Protein: 7.1 g/dL (ref 6.0–8.3)

## 2017-11-09 LAB — BASIC METABOLIC PANEL
BUN: 15 mg/dL (ref 6–23)
CO2: 26 mEq/L (ref 19–32)
Calcium: 9.8 mg/dL (ref 8.4–10.5)
Chloride: 104 mEq/L (ref 96–112)
Creatinine, Ser: 1.04 mg/dL (ref 0.40–1.50)
GFR: 77.24 mL/min (ref >60.00)
Glucose, Bld: 102 mg/dL — ABNORMAL HIGH (ref 70–99)
Potassium: 4.4 mEq/L (ref 3.5–5.1)
Sodium: 139 mEq/L (ref 135–145)

## 2017-11-09 LAB — LIPID PANEL
Cholesterol: 156 mg/dL (ref 0–200)
HDL: 39.9 mg/dL (ref >39.00)
LDL Cholesterol: 96 mg/dL (ref 0–99)
NonHDL: 116.13
Total CHOL/HDL Ratio: 4
Triglycerides: 103 mg/dL (ref 0.0–149.0)
VLDL: 20.6 mg/dL (ref 0.0–40.0)

## 2017-11-09 LAB — TSH: TSH: 1.45 u[IU]/mL (ref 0.35–4.50)

## 2017-11-09 NOTE — Patient Instructions (Addendum)
Monitor blood pressure and be in touch if consistently > 140/90.   Consider new shingles vaccine (Shingrix) and let us know if interested. A

## 2017-11-09 NOTE — Progress Notes (Signed)
Subjective:     Patient ID: Jerry Kramer, male   DOB: 29-Jul-1955, 62 y.o.   MRN: [id number]  HPI Patient seen for physical exam. He has no significant chronic medical problems. History of mild hyperlipidemia. Nonsmoker. No consistent exercise. Recent colonoscopy. Benign polyps. Recommended five-year follow-up. Tetanus up-to-date. No history of shingles vaccine.  Works for hospice. Gets blood pressure monitored there periodically mostly 387F and 643P systolic and around 90 diastolic. He donates blood regularly so no indication for HIV or hepatitis C screening    Past Medical History:  Diagnosis Date  . Allergy   . Colon polyps   . CONTACT DERMATITIS 09/03/2009  . ELEVATED BLOOD PRESSURE 06/19/2010  . Hyperlipidemia   . HYPERLIPIDEMIA 08/27/2009  . UNSPECIFIED OTALGIA 06/19/2010   Past Surgical History:  Procedure Laterality Date  . APPENDECTOMY  1983  . EYE MUSCLE SURGERY Right   . MIDDLE EAR SURGERY    . ROTATOR CUFF REPAIR Left   . TONSILLECTOMY  1965    reports that he has never smoked. He has never used smokeless tobacco. He reports that he does not drink alcohol or use drugs. family history includes Cancer in his mother; Diabetes in his sister; Heart attack (age of onset: 61) in his brother; Heart disease (age of onset: 101) in his brother and mother; Hyperlipidemia in his father; Stroke in his father. Allergies  Allergen Reactions  . Advil [Ibuprofen]     Caused heart to race  . Meperidine Hcl     REACTION: unspecified  . Penicillins     REACTION: unspecified     Review of Systems  Constitutional: Negative for activity change, appetite change, fatigue and fever.  HENT: Negative for congestion, ear pain and trouble swallowing.   Eyes: Negative for pain and visual disturbance.  Respiratory: Negative for cough, shortness of breath and wheezing.   Cardiovascular: Negative for chest pain and palpitations.  Gastrointestinal: Negative for abdominal distention, abdominal pain,  blood in stool, constipation, diarrhea, nausea, rectal pain and vomiting.  Genitourinary: Negative for dysuria, hematuria and testicular pain.  Musculoskeletal: Negative for arthralgias and joint swelling.  Skin: Negative for rash.  Neurological: Negative for dizziness, syncope and headaches.  Hematological: Negative for adenopathy.  Psychiatric/Behavioral: Negative for confusion and dysphoric mood.       Objective:   Physical Exam  Constitutional: He is oriented to person, place, and time. He appears well-developed and well-nourished. No distress.  HENT:  Head: Normocephalic and atraumatic.  Right Ear: External ear normal.  Left Ear: External ear normal.  Mouth/Throat: Oropharynx is clear and moist.  Eyes: Pupils are equal, round, and reactive to light. Conjunctivae and EOM are normal.  Neck: Normal range of motion. Neck supple. No thyromegaly present.  Cardiovascular: Normal rate, regular rhythm and normal heart sounds.  No murmur heard. Pulmonary/Chest: No respiratory distress. He has no wheezes. He has no rales.  Abdominal: Soft. Bowel sounds are normal. He exhibits no distension and no mass. There is no tenderness. There is no rebound and no guarding.  Musculoskeletal: He exhibits no edema.  Lymphadenopathy:    He has no cervical adenopathy.  Neurological: He is alert and oriented to person, place, and time. He displays normal reflexes. No cranial nerve deficit.  Skin: No rash noted.  Psychiatric: He has a normal mood and affect.       Assessment:     Physical exam. Following issues were addressed    Plan:     -Check screening lab work including  PSA -We discussed shingles vaccine and he will check on insurance coverage first -Monitor blood pressure closely and be in touch if consistently greater than 630 systolic or 90 diastolic -Try to establish more consistent aerobic exercise  Eulas Post MD [facility]

## 2018-02-23 ENCOUNTER — Encounter: Payer: Self-pay | Admitting: Family Medicine

## 2018-02-24 ENCOUNTER — Telehealth: Payer: 59 | Admitting: Family

## 2018-02-24 DIAGNOSIS — L255 Unspecified contact dermatitis due to plants, except food: Secondary | ICD-10-CM

## 2018-02-24 MED ORDER — PREDNISONE 10 MG (21) PO TBPK: ORAL_TABLET | ORAL | 0 refills | Status: DC

## 2018-02-24 NOTE — Progress Notes (Signed)

## 2018-03-03 ENCOUNTER — Ambulatory Visit (INDEPENDENT_AMBULATORY_CARE_PROVIDER_SITE_OTHER): Payer: 59 | Admitting: Family Medicine

## 2018-03-03 ENCOUNTER — Encounter: Payer: Self-pay | Admitting: Family Medicine

## 2018-03-03 VITALS — BP 120/84 | HR 75 | Temp 98.7°F | Wt 185.9 lb

## 2018-03-03 DIAGNOSIS — R21 Rash and other nonspecific skin eruption: Secondary | ICD-10-CM

## 2018-03-03 MED ORDER — PREDNISONE 10 MG PO TABS: ORAL_TABLET | ORAL | 0 refills | Status: DC

## 2018-03-03 MED ORDER — METHYLPREDNISOLONE ACETATE 80 MG/ML IJ SUSP
80.0000 mg | Freq: Once | INTRAMUSCULAR | Status: AC
  Administered 2018-03-03: 80 mg via INTRAMUSCULAR

## 2018-03-03 NOTE — Progress Notes (Signed)
  Subjective:     Patient ID: Jerry Kramer, male   DOB: Aug 08, 1955, 62 y.o.   MRN: [id number]  HPI Here with pruritic rash on his legs. This started couple weeks ago. This started after doing some yard work around some brush clearing. He did an E- visit and was given a Sterapred dose taper but unfortunately rash has not cleared with this. He finished last dose yesterday. Has significant pruritus  Past Medical History:  Diagnosis Date  . Allergy   . Colon polyps   . CONTACT DERMATITIS 09/03/2009  . ELEVATED BLOOD PRESSURE 06/19/2010  . Hyperlipidemia   . HYPERLIPIDEMIA 08/27/2009  . UNSPECIFIED OTALGIA 06/19/2010   Past Surgical History:  Procedure Laterality Date  . APPENDECTOMY  1983  . EYE MUSCLE SURGERY Right   . MIDDLE EAR SURGERY    . ROTATOR CUFF REPAIR Left   . TONSILLECTOMY  1965    reports that he has never smoked. He has never used smokeless tobacco. He reports that he does not drink alcohol or use drugs. family history includes Cancer in his mother; Diabetes in his sister; Heart attack (age of onset: 98) in his brother; Heart disease (age of onset: 73) in his brother and mother; Hyperlipidemia in his father; Stroke in his father. Allergies  Allergen Reactions  . Advil [Ibuprofen]     Caused heart to race  . Meperidine Hcl     REACTION: unspecified  . Penicillins     REACTION: unspecified     Review of Systems  Constitutional: Negative for chills and fever.  Skin: Positive for rash.       Objective:   Physical Exam  Constitutional: He appears well-developed and well-nourished.  Cardiovascular: Normal rate.  Skin: Rash noted.  Diffuse slightly raised erythematous vesicular rash lower legs bilaterally       Assessment:     Contact dermatitis    Plan:     -Depo-Medrol 80 mg IM given -We wrote for oral prednisone and printed prescription to use only if not getting adequate relief with intramuscular injection  Jerry Post MD [facility] at  [facility]

## 2018-03-03 NOTE — Patient Instructions (Signed)

## 2018-03-16 DIAGNOSIS — Z23 Encounter for immunization: Secondary | ICD-10-CM | POA: Diagnosis not present

## 2018-07-25 ENCOUNTER — Other Ambulatory Visit: Payer: Self-pay

## 2018-07-25 ENCOUNTER — Ambulatory Visit (INDEPENDENT_AMBULATORY_CARE_PROVIDER_SITE_OTHER): Payer: 59 | Admitting: Family Medicine

## 2018-07-25 ENCOUNTER — Encounter: Payer: Self-pay | Admitting: Family Medicine

## 2018-07-25 VITALS — BP 130/80 | HR 80 | Temp 98.1°F | Ht 66.5 in | Wt 190.8 lb

## 2018-07-25 DIAGNOSIS — R030 Elevated blood-pressure reading, without diagnosis of hypertension: Secondary | ICD-10-CM

## 2018-07-25 NOTE — Progress Notes (Signed)
Subjective:     Patient ID: Jerry Kramer, male   DOB: 1956-03-23, 63 y.o.   MRN: [id number]  HPI Patient is seen today with concern for elevated blood pressure.  He donates blood fairly often has had a couple readings around 160/90 when giving blood.  This is very unusual for him.  has had occasional elevated diastolics here in the past but no consistent elevations.  He has home cuff is gotten fairly consistent readings at home around 125/85.  Takes occasional Tylenol.  Does not take nonsteroidals.  He has allergy to Advil.  No regular alcohol use.  Denies recent headaches or dizziness.  No recent chest pains.  Does have strong family history of hypertension.  He has gained some weight over recent months but plans to get more diligent with diet and plans to lose some weight soon  Past Medical History:  Diagnosis Date  . Allergy   . Colon polyps   . CONTACT DERMATITIS 09/03/2009  . ELEVATED BLOOD PRESSURE 06/19/2010  . Hyperlipidemia   . HYPERLIPIDEMIA 08/27/2009  . UNSPECIFIED OTALGIA 06/19/2010   Past Surgical History:  Procedure Laterality Date  . APPENDECTOMY  1983  . EYE MUSCLE SURGERY Right   . MIDDLE EAR SURGERY    . ROTATOR CUFF REPAIR Left   . TONSILLECTOMY  1965    reports that he has never smoked. He has never used smokeless tobacco. He reports that he does not drink alcohol or use drugs. family history includes Cancer in his mother; Diabetes in his sister; Heart attack (age of onset: 66) in his brother; Heart disease (age of onset: 64) in his brother and mother; Hyperlipidemia in his father; Stroke in his father. Allergies  Allergen Reactions  . Advil [Ibuprofen]     Caused heart to race  . Meperidine Hcl     REACTION: unspecified  . Penicillins     REACTION: unspecified     Review of Systems  Constitutional: Negative for fatigue and unexpected weight change.  Eyes: Negative for visual disturbance.  Respiratory: Negative for cough, chest tightness and shortness of  breath.   Cardiovascular: Negative for chest pain, palpitations and leg swelling.  Endocrine: Negative for polydipsia and polyuria.  Neurological: Negative for dizziness, syncope, weakness, light-headedness and headaches.       Objective:   Physical Exam Constitutional:      Appearance: Normal appearance.  Cardiovascular:     Rate and Rhythm: Normal rate and regular rhythm.  Pulmonary:     Effort: Pulmonary effort is normal.     Breath sounds: Normal breath sounds.  Musculoskeletal:     Right lower leg: No edema.     Left lower leg: No edema.  Neurological:     Mental Status: He is alert.        Assessment:     Recent intermittent elevated blood pressure readings.  His blood pressures actually down today with initial reading here per nurse 130/80 and repeat by me left arm seated at rest 128/88    Plan:     -We recommend observation for now.  Continue to monitor closely at home. -We recommend try to lose some weight.  He has a target to try to get below 180 and we feel his blood pressure will improve and come to goal with modest weight loss.  Discussed role of regular aerobic exercise. -Be in touch if consistently greater than 140/90 with home monitoring of the next several weeks  Eulas Post MD [facility] at [facility]

## 2018-07-25 NOTE — Patient Instructions (Signed)
Monitor blood pressure and be in touch if consistently > 140/90  Try to drop a few pounds.

## 2019-04-06 DIAGNOSIS — Z23 Encounter for immunization: Secondary | ICD-10-CM | POA: Diagnosis not present

## 2019-06-14 ENCOUNTER — Other Ambulatory Visit: Payer: Self-pay | Admitting: Family Medicine

## 2019-06-14 ENCOUNTER — Ambulatory Visit: Payer: 59

## 2019-06-14 ENCOUNTER — Other Ambulatory Visit: Payer: Self-pay

## 2019-06-14 DIAGNOSIS — M25561 Pain in right knee: Secondary | ICD-10-CM

## 2019-11-05 ENCOUNTER — Encounter: Payer: Self-pay | Admitting: Family Medicine

## 2019-11-06 ENCOUNTER — Other Ambulatory Visit: Payer: Self-pay

## 2019-11-07 ENCOUNTER — Ambulatory Visit (INDEPENDENT_AMBULATORY_CARE_PROVIDER_SITE_OTHER): Payer: 59 | Admitting: Family Medicine

## 2019-11-07 ENCOUNTER — Encounter: Payer: Self-pay | Admitting: Family Medicine

## 2019-11-07 VITALS — BP 118/62 | HR 67 | Temp 98.1°F | Wt 182.5 lb

## 2019-11-07 DIAGNOSIS — N529 Male erectile dysfunction, unspecified: Secondary | ICD-10-CM

## 2019-11-07 MED ORDER — SILDENAFIL CITRATE 100 MG PO TABS: 100.0000 mg | ORAL_TABLET | Freq: Every day | ORAL | 5 refills | Status: DC | PRN

## 2019-11-07 NOTE — Progress Notes (Signed)
  Subjective:     Patient ID: Jerry Kramer, male   DOB: [birth year], 64 y.o.   MRN: [id number]  HPI Jerry Kramer is here to discuss erectile dysfunction.  He has been happily married for many years but has had some problems since about February with quality erection and duration of erection.  He has no history of smoking.  No peripheral vascular disease risk factors.  He denies any marital problems.  He thinks that fatigue and stress could be playing some role but he thinks there is some psychological component as well.  He would like to try Viagra or Cialis.  He has no contraindications.  Takes no regular medications.  Past Medical History:  Diagnosis Date  . Allergy   . Colon polyps   . CONTACT DERMATITIS 09/03/2009  . ELEVATED BLOOD PRESSURE 06/19/2010  . Hyperlipidemia   . HYPERLIPIDEMIA 08/27/2009  . UNSPECIFIED OTALGIA 06/19/2010   Past Surgical History:  Procedure Laterality Date  . APPENDECTOMY  1983  . EYE MUSCLE SURGERY Right   . MIDDLE EAR SURGERY    . ROTATOR CUFF REPAIR Left   . TONSILLECTOMY  1965    reports that he has never smoked. He has never used smokeless tobacco. He reports that he does not drink alcohol or use drugs. family history includes Cancer in his mother; Diabetes in his sister; Heart attack (age of onset: 73) in his brother; Heart disease (age of onset: 71) in his brother and mother; Hyperlipidemia in his father; Stroke in his father. Allergies  Allergen Reactions  . Advil [Ibuprofen]     Caused heart to race  . Meperidine Hcl     REACTION: unspecified  . Penicillins     REACTION: unspecified     Review of Systems  Constitutional: Negative for fatigue.  Eyes: Negative for visual disturbance.  Respiratory: Negative for cough, chest tightness and shortness of breath.   Cardiovascular: Negative for chest pain, palpitations and leg swelling.  Neurological: Negative for dizziness, syncope, weakness, light-headedness and headaches.       Objective:    Physical Exam Constitutional:      Appearance: He is well-developed.  HENT:     Right Ear: External ear normal.     Left Ear: External ear normal.  Eyes:     Pupils: Pupils are equal, round, and reactive to light.  Neck:     Thyroid: No thyromegaly.  Cardiovascular:     Rate and Rhythm: Normal rate and regular rhythm.  Pulmonary:     Effort: Pulmonary effort is normal. No respiratory distress.     Breath sounds: Normal breath sounds. No wheezing or rales.  Musculoskeletal:     Cervical back: Neck supple.  Neurological:     Mental Status: He is alert and oriented to person, place, and time.        Assessment:     Erectile dysfunction.  No major risk factors for peripheral vascular disease.    Plan:     -Recommend trial of Viagra 100 mg 1/2 to 1 tablet daily as needed -Touch base if above is not working. -Consider complete physical at some point later this year  Eulas Post MD [facility] at [facility]

## 2020-01-03 ENCOUNTER — Telehealth: Payer: Self-pay | Admitting: *Deleted

## 2020-01-03 DIAGNOSIS — R1310 Dysphagia, unspecified: Secondary | ICD-10-CM

## 2020-01-03 MED ORDER — PANTOPRAZOLE SODIUM 40 MG PO TBEC: 40.0000 mg | DELAYED_RELEASE_TABLET | Freq: Every day | ORAL | 2 refills | Status: DC

## 2020-01-03 NOTE — Telephone Encounter (Signed)
Dr Hilarie Fredrickson has given verbal order for patient to be given pantoprazole 40 mg once daily sent to Kristopher Oppenheim at [facility]. He would also like patient to be scheduled for endoscopy with dilation on Tuesday in [facility] for dysphagia symptoms.  I have spoken to patient who confirms that he has had covid vaccines x 2. He denies being on any diabetic medications or anticoagulation. He indicates that he still has [employer]. I have scheduled patient for endoscopy at [facility] on 01/07/20 at 230 pm with 130 pm arrival. I have advised that he will need care partner 43 or older to bring him, stay and take him home from procedure. I have also given him detailed instructions for time/date/location and prep for procedure. Patient verbalizes clear understanding of these instructions. I have also made written instructions available through mychart.

## 2020-01-04 ENCOUNTER — Encounter: Payer: Self-pay | Admitting: Internal Medicine

## 2020-01-07 ENCOUNTER — Other Ambulatory Visit: Payer: Self-pay

## 2020-01-07 ENCOUNTER — Encounter: Payer: Self-pay | Admitting: Internal Medicine

## 2020-01-07 ENCOUNTER — Ambulatory Visit (AMBULATORY_SURGERY_CENTER): Payer: 59 | Admitting: Internal Medicine

## 2020-01-07 VITALS — BP 125/78 | HR 54 | Temp 98.4°F | Resp 17 | Ht 67.0 in | Wt 182.0 lb

## 2020-01-07 DIAGNOSIS — K222 Esophageal obstruction: Secondary | ICD-10-CM | POA: Diagnosis not present

## 2020-01-07 DIAGNOSIS — R1310 Dysphagia, unspecified: Secondary | ICD-10-CM

## 2020-01-07 DIAGNOSIS — K449 Diaphragmatic hernia without obstruction or gangrene: Secondary | ICD-10-CM | POA: Diagnosis not present

## 2020-01-07 MED ORDER — SODIUM CHLORIDE 0.9 % IV SOLN: 500.0000 mL | Freq: Once | INTRAVENOUS | Status: DC

## 2020-01-07 NOTE — Progress Notes (Signed)
Called to room to assist during endoscopic procedure.  Patient ID and intended procedure confirmed with present staff. Received instructions for my participation in the procedure from the performing physician.  

## 2020-01-07 NOTE — Op Note (Signed)
[facility] Patient Name: Jerry Kramer Procedure Date: 01/07/2020 2:41 PM MRN: [id number] Endoscopist: Jerene Bears , MD Age: 64 Referring MD:  Date of Birth: 1955/10/29 Gender: Male Account #: [id number] Procedure:                Upper GI endoscopy Indications:              Dysphagia Medicines:                Monitored Anesthesia Care Procedure:                Pre-Anesthesia Assessment:                           - Prior to the procedure, a History and Physical                            was performed, and patient medications and                            allergies were reviewed. The patient's tolerance of                            previous anesthesia was also reviewed. The risks                            and benefits of the procedure and the sedation                            options and risks were discussed with the patient.                            All questions were answered, and informed consent                            was obtained. Prior Anticoagulants: The patient has                            taken no previous anticoagulant or antiplatelet                            agents. ASA Grade Assessment: II - A patient with                            mild systemic disease. After reviewing the risks                            and benefits, the patient was deemed in                            satisfactory condition to undergo the procedure.                           After obtaining informed consent, the endoscope was  passed under direct vision. Throughout the                            procedure, the patient's blood pressure, pulse, and                            oxygen saturations were monitored continuously. The                            Endoscope was introduced through the mouth, and                            advanced to the second part of duodenum. The upper                            GI endoscopy was accomplished without difficulty.                             The patient tolerated the procedure well. Scope In: Scope Out: Findings:                 The lower third of the esophagus was significantly                            tortuous. This raises the question of dysmotility                            (versus being related to the hiatal hernia).                           One benign-appearing, intrinsic moderate                            (circumferential scarring or stenosis; an endoscope                            may pass) stenosis was found 33 cm from the                            incisors. This stenosis measured 1 cm (in length).                            The stenosis was traversed. A TTS dilator was                            passed through the scope. Dilation with a 16-17-18                            mm balloon dilator was performed to 17 mm.                           A 3-4 cm hiatal hernia was present.  The entire examined stomach was normal.                           The examined duodenum was normal. Complications:            No immediate complications. Estimated Blood Loss:     Estimated blood loss was minimal. Impression:               - Tortuous esophagus.                           - Benign-appearing esophageal stenosis. Dilated to                            17 mm with balloon.                           - 3-4 cm hiatal hernia.                           - Normal stomach.                           - Normal examined duodenum.                           - No specimens collected. Recommendation:           - Patient has a contact number available for                            emergencies. The signs and symptoms of potential                            delayed complications were discussed with the                            patient. Return to normal activities tomorrow.                            Written discharge instructions were provided to the                            patient.                            - Post-dilation diet then advance diet as tolerated.                           - Continue present medications including                            pantoprazole 40 mg daily x 1 month (to see if                            reflux control improves symptoms).                           -  In approximately 2 weeks perform a barium                            esophagram with tablet to evaluate esophageal                            motility (if abnormal proceed to manometry). Jerene Bears, MD 01/07/2020 3:26:14 PM This report has been signed electronically.

## 2020-01-07 NOTE — Progress Notes (Signed)
PT taken to PACU. Monitors in place. VSS. Report given to RN. 

## 2020-01-07 NOTE — Progress Notes (Signed)
V/S-CW  CHECK-IN-JB

## 2020-01-07 NOTE — Patient Instructions (Signed)
Handouts provided on Hiatal hernia and Post-dilation diet.   Continue present medications including Pantoprazole 40mg  daily for 1 month (to see if reflux control improves symptoms).  In approximately 2 weeks perform a barium esophagram with tablet to evaluate esophageal motility (if abnormal proceed to manometry).  YOU HAD AN ENDOSCOPIC PROCEDURE TODAY AT [facility]:   Refer to the procedure report that was given to you for any specific questions about what was found during the examination.  If the procedure report does not answer your questions, please call your gastroenterologist to clarify.  If you requested that your care partner not be given the details of your procedure findings, then the procedure report has been included in a sealed envelope for you to review at your convenience later.  YOU SHOULD EXPECT: Some feelings of bloating in the abdomen. Passage of more gas than usual.  Walking can help get rid of the air that was put into your GI tract during the procedure and reduce the bloating. If you had a lower endoscopy (such as a colonoscopy or flexible sigmoidoscopy) you may notice spotting of blood in your stool or on the toilet paper. If you underwent a bowel prep for your procedure, you may not have a normal bowel movement for a few days.  Please Note:  You might notice some irritation and congestion in your nose or some drainage.  This is from the oxygen used during your procedure.  There is no need for concern and it should clear up in a day or so.  SYMPTOMS TO REPORT IMMEDIATELY:   Following upper endoscopy (EGD)  Vomiting of blood or coffee ground material  New chest pain or pain under the shoulder blades  Painful or persistently difficult swallowing  New shortness of breath  Fever of 100F or higher  Black, tarry-looking stools  For urgent or emergent issues, a gastroenterologist can be reached at any hour by calling [phone]. Do not use MyChart  messaging for urgent concerns.    DIET:  Clear liquids for 2 hours. Then a soft diet for the rest of today (see handout). May proceed to a regular diet tomorrow if able to tolerate.  Drink plenty of fluids but you should avoid alcoholic beverages for 24 hours.  ACTIVITY:  You should plan to take it easy for the rest of today and you should NOT DRIVE or use heavy machinery until tomorrow (because of the sedation medicines used during the test).    FOLLOW UP: Our staff will call the number listed on your records 48-72 hours following your procedure to check on you and address any questions or concerns that you may have regarding the information given to you following your procedure. If we do not reach you, we will leave a message.  We will attempt to reach you two times.  During this call, we will ask if you have developed any symptoms of COVID 19. If you develop any symptoms (ie: fever, flu-like symptoms, shortness of breath, cough etc.) before then, please call [phone].  If you test positive for Covid 19 in the 2 weeks post procedure, please call and report this information to [place].    If any biopsies were taken you will be contacted by phone or by letter within the next 1-3 weeks.  Please call us at [phone] if you have not heard about the biopsies in 3 weeks.    SIGNATURES/CONFIDENTIALITY: You and/or your care partner have signed paperwork which will be entered  into your electronic medical record.  These signatures attest to the fact that that the information above on your After Visit Summary has been reviewed and is understood.  Full responsibility of the confidentiality of this discharge information lies with you and/or your care-partner.

## 2020-01-08 ENCOUNTER — Other Ambulatory Visit: Payer: Self-pay

## 2020-01-08 DIAGNOSIS — R1310 Dysphagia, unspecified: Secondary | ICD-10-CM

## 2020-01-09 ENCOUNTER — Telehealth: Payer: Self-pay

## 2020-01-09 NOTE — Telephone Encounter (Signed)
Pt scheduled for barium esophagram at [facility] 01/14/20@9 :30, pt to arrive there at 9:15am and be NPO after midnight. Pt aware of appt.

## 2020-01-09 NOTE — Telephone Encounter (Signed)
  Follow up Call-  Call back number 01/07/2020 09/19/2017  Post procedure Call Back phone  # [phone]  Permission to leave phone message Yes Yes  Some recent data might be hidden     Patient questions:  Do you have a fever, pain , or abdominal swelling? No. Pain Score  0 *  Have you tolerated food without any problems? Yes.    Have you been able to return to your normal activities? Yes.    Do you have any questions about your discharge instructions: Diet   No. Medications  No. Follow up visit  No.  Do you have questions or concerns about your Care? No.  Actions: * If pain score is 4 or above: No action needed, pain <4.   1. Have you developed a fever since your procedure? No   2.   Have you had an respiratory symptoms (SOB or cough) since your procedure? No   3.   Have you tested positive for COVID 19 since your procedure? No   4.   Have you had any family members/close contacts diagnosed with the COVID 19 since your procedure?  No    If yes to any of these questions please route to Joylene John, RN and Erenest Rasher, RN

## 2020-01-14 ENCOUNTER — Other Ambulatory Visit: Payer: Self-pay

## 2020-01-14 ENCOUNTER — Ambulatory Visit (HOSPITAL_COMMUNITY)
Admission: RE | Admit: 2020-01-14 | Discharge: 2020-01-14 | Disposition: A | Discharge status: Home / Self Care | Payer: 59 | Source: Ambulatory Visit | Attending: Internal Medicine | Admitting: Internal Medicine

## 2020-01-14 DIAGNOSIS — K224 Dyskinesia of esophagus: Secondary | ICD-10-CM | POA: Diagnosis not present

## 2020-01-14 DIAGNOSIS — R1310 Dysphagia, unspecified: Secondary | ICD-10-CM | POA: Diagnosis not present

## 2020-01-14 DIAGNOSIS — K219 Gastro-esophageal reflux disease without esophagitis: Secondary | ICD-10-CM | POA: Diagnosis not present

## 2020-01-15 ENCOUNTER — Other Ambulatory Visit: Payer: Self-pay

## 2020-01-15 ENCOUNTER — Telehealth: Payer: Self-pay | Admitting: Internal Medicine

## 2020-01-15 DIAGNOSIS — R1310 Dysphagia, unspecified: Secondary | ICD-10-CM

## 2020-01-15 NOTE — Telephone Encounter (Signed)
Pt scheduled for covid screen 02/05/20@9 :30am, EM scheduled at [facility] 02/06/20@12 :30pm, pt to arrive there at 12noon. Pts daughter aware of appts and instructions sent via mychart,.

## 2020-01-15 NOTE — Telephone Encounter (Signed)
Jerry Kramer states she is friends with Dr Hilarie Fredrickson, she needs for the pt to be scheduled for an appt at [name] and is requesting for a call back.

## 2020-02-02 ENCOUNTER — Other Ambulatory Visit (HOSPITAL_COMMUNITY): Payer: 59

## 2020-02-05 ENCOUNTER — Other Ambulatory Visit (HOSPITAL_COMMUNITY)
Admission: RE | Admit: 2020-02-05 | Discharge: 2020-02-05 | Disposition: A | Discharge status: Home / Self Care | Payer: 59 | Source: Ambulatory Visit | Attending: Gastroenterology | Admitting: Gastroenterology

## 2020-02-05 DIAGNOSIS — Z01812 Encounter for preprocedural laboratory examination: Secondary | ICD-10-CM | POA: Diagnosis not present

## 2020-02-05 DIAGNOSIS — Z20822 Contact with and (suspected) exposure to covid-19: Secondary | ICD-10-CM | POA: Diagnosis not present

## 2020-02-05 LAB — SARS CORONAVIRUS 2 (TAT 6-24 HRS): SARS Coronavirus 2: NEGATIVE

## 2020-02-06 ENCOUNTER — Encounter (HOSPITAL_COMMUNITY): Payer: Self-pay | Admitting: Gastroenterology

## 2020-02-06 ENCOUNTER — Ambulatory Visit (HOSPITAL_COMMUNITY)
Admission: RE | Admit: 2020-02-06 | Discharge: 2020-02-06 | Disposition: A | Discharge status: Home / Self Care | Payer: 59 | Attending: Gastroenterology | Admitting: Gastroenterology

## 2020-02-06 ENCOUNTER — Encounter (HOSPITAL_COMMUNITY)
Admission: RE | Disposition: A | Discharge status: Home / Self Care | Payer: Self-pay | Source: Home / Self Care | Attending: Gastroenterology

## 2020-02-06 DIAGNOSIS — R1319 Other dysphagia: Secondary | ICD-10-CM | POA: Diagnosis not present

## 2020-02-06 DIAGNOSIS — Z538 Procedure and treatment not carried out for other reasons: Secondary | ICD-10-CM | POA: Diagnosis not present

## 2020-02-06 SURGERY — INVASIVE LAB ABORTED CASE

## 2020-02-06 MED ORDER — LIDOCAINE VISCOUS HCL 2 % MT SOLN
OROMUCOSAL | Status: AC
  Filled 2020-02-06: qty 15

## 2020-02-06 SURGICAL SUPPLY — 2 items
FACESHIELD LNG OPTICON STERILE (SAFETY) IMPLANT
GLOVE BIO SURGEON STRL SZ8 (GLOVE) ×6 IMPLANT

## 2020-02-06 NOTE — Progress Notes (Signed)
Attempted esophageal manometry.  Unable to insert probe past hiatal hernia into stomach after several attempts.  Procedure aborted and Dr Harl Bowie made aware.

## 2020-03-20 ENCOUNTER — Telehealth: Payer: Self-pay | Admitting: *Deleted

## 2020-03-20 MED ORDER — PANTOPRAZOLE SODIUM 40 MG PO TBEC: 40.0000 mg | DELAYED_RELEASE_TABLET | Freq: Every day | ORAL | 11 refills | Status: DC

## 2020-03-20 NOTE — Telephone Encounter (Signed)
Rx has been sent.   ===View-only below this line=== ----- Message ----- From: Jerene Bears, MD Sent: 03/19/2020   9:31 PM EDT To: Larina Bras, CMA Subject: Jerry Kramer's dad needs a ppi refill. Okay to refill x 1 year GERD/ esophageal dysmotility Thanks  JMP

## 2020-08-01 ENCOUNTER — Telehealth: Payer: Self-pay | Admitting: Internal Medicine

## 2020-08-01 NOTE — Telephone Encounter (Signed)
Contacted by the patient's daughter, [name] Patient has had more issues with dysphagia symptom. He has had prior GI evaluation including endoscopy and barium esophagram.  There is concern for esophageal motility disorder. Manometry was attempted last year but failed due to intolerance and inability to place the manometry catheter Endoscopic placement of manometry catheter for high resolution esophageal manometry was considered but the patient chose to defer at that time.  Patient's daughter states that his symptoms have become more noticeable and he would like to proceed with repeat upper endoscopy at [facility] with endoscopic placement of manometry catheter followed by high resolution esophageal manometry  This will need to be done in an outpatient hospital block with coordination with esophageal manometry nurse. I am not aware that I have any immediate availability at [facility] but can look for such time including on administrative time if necessary.  I will also asked Dr. Bryan Lemma, given his additional hospital time if he has any availability in the coming weeks.  Thanks [employer]

## 2020-08-04 ENCOUNTER — Other Ambulatory Visit: Payer: Self-pay | Admitting: Gastroenterology

## 2020-08-04 DIAGNOSIS — R1310 Dysphagia, unspecified: Secondary | ICD-10-CM

## 2020-08-04 NOTE — Telephone Encounter (Signed)
No problem, happy to help out.  This might be too short of notice, but I did have some cancellations in my [facility] block this week.  If [facility] is able to coordinate, I am happy to get it done this week.  Otherwise, Claiborne Billings, can we look to see what other near-term space availability we have to get him in?

## 2020-08-04 NOTE — Telephone Encounter (Signed)
Spoke to patient to inform him that he is scheduled for EGD at [facility] on 08/06/20 9:15 am then will place mano prob at 10:30 am. He will have covid screening 08/05/20 at 8 am. Spoke with the charge nurse, Theadora Rama in the Endo unit to coordinate this time. Instructions have been discussed with patient, He will also review in MyChart. Patient was advised to call the office with any further questions.

## 2020-08-04 NOTE — Telephone Encounter (Signed)
Thanks

## 2020-08-05 ENCOUNTER — Other Ambulatory Visit: Payer: Self-pay

## 2020-08-05 ENCOUNTER — Other Ambulatory Visit (HOSPITAL_COMMUNITY)
Admission: RE | Admit: 2020-08-05 | Discharge: 2020-08-05 | Disposition: A | Discharge status: Home / Self Care | Payer: 59 | Source: Ambulatory Visit | Attending: Gastroenterology | Admitting: Gastroenterology

## 2020-08-05 DIAGNOSIS — Z01812 Encounter for preprocedural laboratory examination: Secondary | ICD-10-CM | POA: Diagnosis not present

## 2020-08-05 DIAGNOSIS — Z20822 Contact with and (suspected) exposure to covid-19: Secondary | ICD-10-CM | POA: Insufficient documentation

## 2020-08-05 LAB — SARS CORONAVIRUS 2 (TAT 6-24 HRS): SARS Coronavirus 2: NEGATIVE

## 2020-08-05 NOTE — Anesthesia Preprocedure Evaluation (Addendum)
Anesthesia Evaluation  Patient identified by MRN, date of birth, ID band Patient awake    Reviewed: Allergy & Precautions, H&P , NPO status , Patient's Chart, lab work & pertinent test results  Airway Mallampati: I  TM Distance: >3 FB Neck ROM: Full  Mouth opening: Limited Mouth Opening  Dental no notable dental hx.    Pulmonary neg pulmonary ROS,    Pulmonary exam normal breath sounds clear to auscultation       Cardiovascular hypertension, negative cardio ROS Normal cardiovascular exam Rhythm:Regular Rate:Normal     Neuro/Psych negative neurological ROS  negative psych ROS   GI/Hepatic negative GI ROS, Neg liver ROS, dysphagia   Endo/Other  hyperlipidemia  Renal/GU negative Renal ROS  negative genitourinary   Musculoskeletal negative musculoskeletal ROS (+)   Abdominal   Peds negative pediatric ROS (+)  Hematology negative hematology ROS (+)   Anesthesia Other Findings   Reproductive/Obstetrics negative OB ROS                            Anesthesia Physical Anesthesia Plan  ASA: II  Anesthesia Plan: MAC   Post-op Pain Management:    Induction: Intravenous  PONV Risk Score and Plan: 1 and Propofol infusion, Treatment may vary due to age or medical condition and TIVA  Airway Management Planned: Natural Airway and Simple Face Mask  Additional Equipment:   Intra-op Plan:   Post-operative Plan:   Informed Consent: I have reviewed the patients History and Physical, chart, labs and discussed the procedure including the risks, benefits and alternatives for the proposed anesthesia with the patient or authorized representative who has indicated his/her understanding and acceptance.       Plan Discussed with: CRNA, Anesthesiologist and Surgeon  Anesthesia Plan Comments:        Anesthesia Quick Evaluation

## 2020-08-06 ENCOUNTER — Ambulatory Visit (HOSPITAL_COMMUNITY): Payer: 59 | Admitting: Anesthesiology

## 2020-08-06 ENCOUNTER — Ambulatory Visit (HOSPITAL_COMMUNITY)
Admission: RE | Admit: 2020-08-06 | Discharge: 2020-08-06 | Disposition: A | Discharge status: Home / Self Care | Payer: 59 | Attending: Gastroenterology | Admitting: Gastroenterology

## 2020-08-06 ENCOUNTER — Encounter (HOSPITAL_COMMUNITY)
Admission: RE | Disposition: A | Discharge status: Home / Self Care | Payer: Self-pay | Source: Home / Self Care | Attending: Gastroenterology

## 2020-08-06 ENCOUNTER — Other Ambulatory Visit: Payer: Self-pay

## 2020-08-06 ENCOUNTER — Encounter (HOSPITAL_COMMUNITY): Payer: Self-pay | Admitting: Gastroenterology

## 2020-08-06 DIAGNOSIS — E785 Hyperlipidemia, unspecified: Secondary | ICD-10-CM | POA: Diagnosis not present

## 2020-08-06 DIAGNOSIS — K2970 Gastritis, unspecified, without bleeding: Secondary | ICD-10-CM

## 2020-08-06 DIAGNOSIS — R1319 Other dysphagia: Secondary | ICD-10-CM

## 2020-08-06 DIAGNOSIS — Q399 Congenital malformation of esophagus, unspecified: Secondary | ICD-10-CM | POA: Insufficient documentation

## 2020-08-06 DIAGNOSIS — K2289 Other specified disease of esophagus: Secondary | ICD-10-CM | POA: Insufficient documentation

## 2020-08-06 DIAGNOSIS — I10 Essential (primary) hypertension: Secondary | ICD-10-CM | POA: Diagnosis not present

## 2020-08-06 DIAGNOSIS — K449 Diaphragmatic hernia without obstruction or gangrene: Secondary | ICD-10-CM | POA: Diagnosis not present

## 2020-08-06 DIAGNOSIS — R1310 Dysphagia, unspecified: Secondary | ICD-10-CM

## 2020-08-06 DIAGNOSIS — K2950 Unspecified chronic gastritis without bleeding: Secondary | ICD-10-CM | POA: Diagnosis not present

## 2020-08-06 DIAGNOSIS — K222 Esophageal obstruction: Secondary | ICD-10-CM | POA: Diagnosis not present

## 2020-08-06 HISTORY — PX: ESOPHAGEAL MANOMETRY: SHX5429

## 2020-08-06 HISTORY — PX: ESOPHAGOGASTRODUODENOSCOPY (EGD) WITH PROPOFOL: SHX5813

## 2020-08-06 HISTORY — PX: BIOPSY: SHX5522

## 2020-08-06 SURGERY — MANOMETRY, ESOPHAGUS

## 2020-08-06 SURGERY — ESOPHAGOGASTRODUODENOSCOPY (EGD) WITH PROPOFOL
Anesthesia: Monitor Anesthesia Care

## 2020-08-06 MED ORDER — LIDOCAINE 2% (20 MG/ML) 5 ML SYRINGE
INTRAMUSCULAR | Status: DC | PRN
  Administered 2020-08-06 (×2): 80 mg via INTRAVENOUS

## 2020-08-06 MED ORDER — PROPOFOL 10 MG/ML IV BOLUS
INTRAVENOUS | Status: DC | PRN
  Administered 2020-08-06: 30 mg via INTRAVENOUS

## 2020-08-06 MED ORDER — SODIUM CHLORIDE 0.9 % IV SOLN: INTRAVENOUS | Status: DC

## 2020-08-06 MED ORDER — LACTATED RINGERS IV SOLN
INTRAVENOUS | Status: DC
  Administered 2020-08-06: 1000 mL via INTRAVENOUS

## 2020-08-06 MED ORDER — PROPOFOL 500 MG/50ML IV EMUL
INTRAVENOUS | Status: AC
  Filled 2020-08-06: qty 50

## 2020-08-06 MED ORDER — GLYCOPYRROLATE 0.2 MG/ML IJ SOLN
INTRAMUSCULAR | Status: DC | PRN
  Administered 2020-08-06: .2 mg via INTRAVENOUS

## 2020-08-06 MED ORDER — PROPOFOL 500 MG/50ML IV EMUL
INTRAVENOUS | Status: DC | PRN
  Administered 2020-08-06: 125 ug/kg/min via INTRAVENOUS

## 2020-08-06 SURGICAL SUPPLY — 15 items

## 2020-08-06 SURGICAL SUPPLY — 2 items
FACESHIELD LNG OPTICON STERILE (SAFETY) IMPLANT
GLOVE BIO SURGEON STRL SZ8 (GLOVE) ×4 IMPLANT

## 2020-08-06 NOTE — Anesthesia Postprocedure Evaluation (Signed)
Anesthesia Post Note  Patient: Jerry Kramer  Procedure(s) Performed: ESOPHAGOGASTRODUODENOSCOPY (EGD) WITH PROPOFOL (N/A ) BIOPSY     Patient location during evaluation: Endoscopy Anesthesia Type: MAC Level of consciousness: awake and alert Pain management: pain level controlled Vital Signs Assessment: post-procedure vital signs reviewed and stable Respiratory status: spontaneous breathing, nonlabored ventilation and respiratory function stable Cardiovascular status: blood pressure returned to baseline and stable Postop Assessment: no apparent nausea or vomiting Anesthetic complications: no   No complications documented.  Last Vitals:  Vitals:   08/06/20 1010 08/06/20 1020  BP: (!) 133/100 135/83  Pulse: 81   Resp: 13   Temp:    SpO2: 95%     Last Pain:  Vitals:   08/06/20 1020  TempSrc:   PainSc: 0-No pain                 Merlinda Frederick

## 2020-08-06 NOTE — Progress Notes (Signed)
Esophageal manometry performed per protocol.  Mano probe placed during EGD procedure.Patient tolerated well. Mano probe removed without any difficulties.  Report to be sent to Dr. Harl Bowie

## 2020-08-06 NOTE — Transfer of Care (Signed)
Immediate Anesthesia Transfer of Care Note  Patient: Jerry Kramer  Procedure(s) Performed: ESOPHAGOGASTRODUODENOSCOPY (EGD) WITH PROPOFOL (N/A ) BIOPSY  Patient Location: PACU  Anesthesia Type:MAC  Level of Consciousness: awake, alert  and oriented  Airway & Oxygen Therapy: Patient Spontanous Breathing and Patient connected to face mask oxygen  Post-op Assessment: Report given to RN and Post -op Vital signs reviewed and stable  Post vital signs: Reviewed and stable  Last Vitals:  Vitals Value Taken Time  BP 139/112 08/06/20 1000  Temp 36.7 C 08/06/20 1001  Pulse 80 08/06/20 1004  Resp 15 08/06/20 1004  SpO2 93 % 08/06/20 1004  Vitals shown include unvalidated device data.  Last Pain:  Vitals:   08/06/20 1001  TempSrc: Oral         Complications: No complications documented.

## 2020-08-06 NOTE — Interval H&P Note (Signed)
History and Physical Interval Note:  08/06/2020 9:02 AM  Jerry Kramer  has presented today for surgery, with the diagnosis of dysphagia.  The various methods of treatment have been discussed with the patient and family. After consideration of risks, benefits and other options for treatment, the patient has consented to  Procedure(s): ESOPHAGOGASTRODUODENOSCOPY (EGD) WITH PROPOFOL (N/A) as a surgical intervention along with endoscopic placement of Esophageal Manometry probe with subsequent Esophageal Manometry testing.  The patient's history has been reviewed, patient examined, no change in status, stable for surgery.  I have reviewed the patient's chart and labs.  Questions were answered to the patient's satisfaction.     [name]

## 2020-08-06 NOTE — H&P (Signed)
P  Chief Complaint:    Dysphagia  HPI:     Patient is a 65 y.o. male presenting to the [facility] unit for EGD along with endoscopic Esophageal Manometry probe placement for continued evaluation of longstanding history of dysphagia.  Attempted EM in 01/2020, but unable to advance manometry probe past hiatal hernia.  Since then, he has continued to have progressive solid food dysphagia.  No issue tolerating liquids.  History of reflux many years ago, but that has largely abated with dietary modifications.  Rare episodes of heartburn/regurgitation when he eats spicy or tomato based sauces, which is largely controlled with Tums.  Aside from 4-week course of PPI after EGD in 12/2019, does not take any chronic acid suppression therapy.  Otherwise, no hematochezia, melena, abdominal pain.  Previous GI evaluation for dysphagia includes: -EGD (12/2019, Dr. Hilarie Fredrickson): 3-4 cm HH, tortuous lower esophagus, benign-appearing intrinsic stenosis at 33 cm dilated with 17 mm TTS balloon.  Normal stomach and duodenum -Barium esophagram (12/2019): Distal esophagus is narrowed and tortuous, with contrast passing in upright position.  In supine position, moderate esophageal dysmotility with stasis of contrast in upper esophagus and tertiary contractions in the distal narrowed esophagus.  Trace reflux with provocative maneuvers.  Small HH.  13 mm tablet temporarily delayed at the level of the hiatal hernia and subsequently passed with sip of water. -Esophageal Manometry (01/2020): Attempted, but unable to advance catheter beyond hiatal hernia    Review of systems:     No chest pain, no SOB, no fevers, no urinary sx   Past Medical History:  Diagnosis Date  . Allergy   . Colon polyps   . CONTACT DERMATITIS 09/03/2009  . ELEVATED BLOOD PRESSURE 06/19/2010  . Hyperlipidemia   . HYPERLIPIDEMIA 08/27/2009  . UNSPECIFIED OTALGIA 06/19/2010    Patient's surgical history, family medical history, social  history, medications and allergies were all reviewed in Epic    Current Facility-Administered Medications  Medication Dose Route Frequency Provider Last Rate Last Admin  . 0.9 %  sodium chloride infusion   Intravenous Continuous Aniyha Tate V, DO      . 0.9 %  sodium chloride infusion   Intravenous Continuous Doroteo Nickolson V, DO      . lactated ringers infusion   Intravenous Continuous Tramell Piechota V, DO        Physical Exam:     BP (!) 145/94   Pulse 81   Temp 98 F (36.7 C) (Oral)   Resp (!) 23   Ht 5\' 7"  (1.702 m)   Wt 81.6 kg   SpO2 98%   BMI 28.19 kg/m   GENERAL:  Pleasant male in NAD PSYCH: : Cooperative, normal affect EENT:  conjunctiva pink, mucous membranes moist, neck supple without masses CARDIAC:  RRR, no murmur heard, no peripheral edema PULM: Normal respiratory effort, lungs CTA bilaterally, no wheezing ABDOMEN:  Nondistended, soft, nontender. No obvious masses, no hepatomegaly,  normal bowel sounds SKIN:  turgor, no lesions seen Musculoskeletal:  Normal muscle tone, normal strength NEURO: Alert and oriented x 3, no focal neurologic deficits   IMPRESSION and PLAN:    1) Dysphagia 2) Esophageal stricture 3) Presbyesophagus 4) Hiatal hernia  -EGD today for diagnostic and potentially therapeutic intent.  Plan for dilation only of high-grade stricture.  Otherwise, if only minimal stricture noted, will not dilate so do not throw off results of manometry. -Plan for endoscopic placement of Esophageal Manometry probe.  Lavena Bullion ,DO, FACG 08/06/2020, 8:43 AM

## 2020-08-06 NOTE — Op Note (Signed)
[facility] Patient Name: Jerry Kramer Procedure Date: 08/06/2020 MRN: [id number] Attending MD: Gerrit Heck , MD Date of Birth: May 22, 1956 CSN: [id number] Age: 65 Admit Type: Outpatient Procedure:                Upper GI endoscopy with biopsy and Esophageal                            manometry probe placement Indications:              Dysphagia, Hiatal hernia Providers:                Gerrit Heck, MD, Cleda Daub, RN, Elspeth Cho Tech., Technician, [facility],                            CRNA Referring MD:              Medicines:                Monitored Anesthesia Care Complications:            No immediate complications. Estimated Blood Loss:     Estimated blood loss was minimal. Procedure:                Pre-Anesthesia Assessment:                           - Prior to the procedure, a History and Physical                            was performed, and patient medications and                            allergies were reviewed. The patient's tolerance of                            previous anesthesia was also reviewed. The risks                            and benefits of the procedure and the sedation                            options and risks were discussed with the patient.                            All questions were answered, and informed consent                            was obtained. Prior Anticoagulants: The patient has                            taken no previous anticoagulant or antiplatelet                            agents. ASA Grade  Assessment: II - A patient with                            mild systemic disease. After reviewing the risks                            and benefits, the patient was deemed in                            satisfactory condition to undergo the procedure.                           After obtaining informed consent, the endoscope was                            passed under direct vision.  Throughout the                            procedure, the patient's blood pressure, pulse, and                            oxygen saturations were monitored continuously. The                            GIF-H190 ([id number] Olympus gastroscope was                            introduced through the mouth, and advanced to the                            second part of duodenum. The upper GI endoscopy was                            technically difficult and complex. The patient                            tolerated the procedure fairly well. During                            advancement of the manometry probe, the patient                            develoed an acute laryngospasm with brief period of                            oxygen desaturation into the 70's, requiring                            removal of manometry probe and endoscope, and                            increased supplemental O2 via mask and jaw thrust.  Oxygen saturation quickly responded and blood                            pressure and heart rate remained stable the entire                            time. The procedure was reattempted with successful                            placement of the manometry probe into the stomach                            as below and patient transported to recovery. Scope In: Scope Out: Findings:      Esophagogastric landmarks were identified: the Z-line was found at 35       cm, the gastroesophageal junction was found at 35 cm and the site of       hiatal narrowing was found at 40 cm from the incisors.      A 5 cm hiatal hernia was present.      The lower third of the esophagus was significantly tortuous. The       endoscope was withdrawn and the manometry probe was placed in the left       nares and advanced into the esophagus. As detailed above, on initial       probe placement the patient developed bronchospasm which required       probe/endoscope removal and increased  supplemental oxygen delivery.       Following patient stabilization, the manometry probe was placed in the       esophagus and advanced into the stomach using the endoscope and snare.       Snare was released and the manometry probe remained in place during       endoscope withdrawal and was secured to the nose via tap.      One benign-appearing, intrinsic mild stenosis was found 34 cm from the       incisors. This stenosis measured less than one cm (in length). The       stenosis was traversed. Given plan for Esophageal manometry, repeat       stricture dilation was not performed today.      Localized mild inflammation characterized by erythema was found in the       gastric antrum. Biopsies were taken with a cold forceps for Helicobacter       pylori testing. Estimated blood loss was minimal.      The gastric fundus, gastric body, incisura and pylorus were normal.      The examined duodenum was normal. Impression:               - Esophagogastric landmarks identified.                           - 5 cm hiatal hernia.                           - Tortuous esophagus.                           - Benign-appearing esophageal stenosis.                           -  Gastritis. Biopsied.                           - Normal gastric fundus, gastric body, incisura and                            pylorus.                           - Normal examined duodenum.                           - Esophageal manometry probe was placed. Moderate Sedation:      Not Applicable - Patient had care per Anesthesia. Recommendation:           - Patient has a contact number available for                            emergencies. The signs and symptoms of potential                            delayed complications were discussed with the                            patient. Return to normal activities tomorrow.                            Written discharge instructions were provided to the                            patient.                            - NPO for now pending Esophageal Manometry study                            after recovery from sedation.                           - Additional recommendations after completion of EM                            study.                           - Continue cutting food into small pieces, chewing                            throroughly, and drinking plenty of fluids with                            meals.                           - Follow-up with Dr. Hilarie Fredrickson in the GI clinic at  appointment to be scheduled. Procedure Code(s):        --- Professional ---                           [phone], Esophagogastroduodenoscopy, flexible,                            transoral; with biopsy, single or multiple Diagnosis Code(s):        --- Professional ---                           K44.9, Diaphragmatic hernia without obstruction or                            gangrene                           Q39.9, Congenital malformation of esophagus,                            unspecified                           K22.2, Esophageal obstruction                           K29.70, Gastritis, unspecified, without bleeding                           R13.10, Dysphagia, unspecified CPT copyright 2019 [employer]. All rights reserved. The codes documented in this report are preliminary and upon coder review may  be revised to meet current compliance requirements. Gerrit Heck, MD 08/06/2020 10:04:43 AM Number of Addenda: 0

## 2020-08-06 NOTE — Discharge Instructions (Signed)
YOU HAD AN ENDOSCOPIC PROCEDURE TODAY: Refer to the procedure report and other information in the discharge instructions given to you for any specific questions about what was found during the examination. If this information does not answer your questions, please call [place] office at [phone] to clarify.   YOU SHOULD EXPECT: Some feelings of bloating in the abdomen. Passage of more gas than usual. Walking can help get rid of the air that was put into your GI tract during the procedure and reduce the bloating. If you had a lower endoscopy (such as a colonoscopy or flexible sigmoidoscopy) you may notice spotting of blood in your stool or on the toilet paper. Some abdominal soreness may be present for a day or two, also.  DIET: Your first meal following the procedure should be a light meal and then it is ok to progress to your normal diet. A half-sandwich or bowl of soup is an example of a good first meal. Heavy or fried foods are harder to digest and may make you feel nauseous or bloated. Drink plenty of fluids but you should avoid alcoholic beverages for 24 hours. If you had a esophageal dilation, please see attached instructions for diet.    ACTIVITY: Your care partner should take you home directly after the procedure. You should plan to take it easy, moving slowly for the rest of the day. You can resume normal activity the day after the procedure however YOU SHOULD NOT DRIVE, use power tools, machinery or perform tasks that involve climbing or major physical exertion for 24 hours (because of the sedation medicines used during the test).   SYMPTOMS TO REPORT IMMEDIATELY: A gastroenterologist can be reached at any hour. Please call [phone]  for any of the following symptoms:  . Following upper endoscopy (EGD, EUS, ERCP, esophageal dilation) Vomiting of blood or coffee ground material  New, significant abdominal pain  New, significant chest pain or pain under the shoulder blades  Painful or  persistently difficult swallowing  New shortness of breath  Black, tarry-looking or red, bloody stools  FOLLOW UP:  If any biopsies were taken you will be contacted by phone or by letter within the next 1-3 weeks. Call [phone]  if you have not heard about the biopsies in 3 weeks.  Please also call with any specific questions about appointments or follow up tests.YOU HAD AN ENDOSCOPIC PROCEDURE TODAY: Refer to the procedure report and other information in the discharge instructions given to you for any specific questions about what was found during the examination. If this information does not answer your questions, please call [place] office at [phone] to clarify.   YOU SHOULD EXPECT: Some feelings of bloating in the abdomen. Passage of more gas than usual. Walking can help get rid of the air that was put into your GI tract during the procedure and reduce the bloating. If you had a lower endoscopy (such as a colonoscopy or flexible sigmoidoscopy) you may notice spotting of blood in your stool or on the toilet paper. Some abdominal soreness may be present for a day or two, also.  DIET: Your first meal following the procedure should be a light meal and then it is ok to progress to your normal diet. A half-sandwich or bowl of soup is an example of a good first meal. Heavy or fried foods are harder to digest and may make you feel nauseous or bloated. Drink plenty of fluids but you should avoid alcoholic beverages for 24 hours. If you had   a esophageal dilation, please see attached instructions for diet.    ACTIVITY: Your care partner should take you home directly after the procedure. You should plan to take it easy, moving slowly for the rest of the day. You can resume normal activity the day after the procedure however YOU SHOULD NOT DRIVE, use power tools, machinery or perform tasks that involve climbing or major physical exertion for 24 hours (because of the sedation medicines used during the  test).   SYMPTOMS TO REPORT IMMEDIATELY: A gastroenterologist can be reached at any hour. Please call [phone]  for any of the following symptoms:  . Following lower endoscopy (colonoscopy, flexible sigmoidoscopy) Excessive amounts of blood in the stool  Significant tenderness, worsening of abdominal pains  Swelling of the abdomen that is new, acute  Fever of 100 or higher  . Following upper endoscopy (EGD, EUS, ERCP, esophageal dilation) Vomiting of blood or coffee ground material  New, significant abdominal pain  New, significant chest pain or pain under the shoulder blades  Painful or persistently difficult swallowing  New shortness of breath  Black, tarry-looking or red, bloody stools  FOLLOW UP:  If any biopsies were taken you will be contacted by phone or by letter within the next 1-3 weeks. Call [phone]  if you have not heard about the biopsies in 3 weeks.  Please also call with any specific questions about appointments or follow up tests.

## 2020-08-07 LAB — SURGICAL PATHOLOGY

## 2020-08-08 ENCOUNTER — Encounter (HOSPITAL_COMMUNITY): Payer: Self-pay | Admitting: Gastroenterology

## 2020-08-22 ENCOUNTER — Telehealth: Payer: Self-pay | Admitting: Internal Medicine

## 2020-08-22 NOTE — Telephone Encounter (Signed)
I spoke to Jerry Kramer, the patient's daughter regarding his esophageal manometry results. Dr. 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  Jerry Kramer will talk to Dr. Rosendo Gros in this regard next week. From a medical/GI perspective we will continue to control reflux with pantoprazole 40 mg daily. Esophageal dilation can be repeated for dysphagia symptoms but long-term hiatal hernia repair felt best to improve overall symptoms.  Jerry Kramer will speak to her dad to deliver these results and also be back in touch with me when she speaks to Dr. Rosendo Gros

## 2020-09-03 ENCOUNTER — Telehealth: Payer: Self-pay | Admitting: *Deleted

## 2020-09-03 DIAGNOSIS — K449 Diaphragmatic hernia without obstruction or gangrene: Secondary | ICD-10-CM

## 2020-09-03 NOTE — Telephone Encounter (Signed)
Per Dr Hilarie Fredrickson- CT chest, non-contrast, large HH, surgical consult for repair. he is scheduled to see dr. Kipp Brood who needs this info. best if done before March 17  Patient has been scheduled for Wednesday, 09/10/20 @ 2:15 pm at [place]. He is to arrive at 2:00 pm. There is no prep for this appointment since no contrast will be given. I have spoken to patient about this appointment and he is aware of time/date/location and is agreeable to this.

## 2020-09-10 ENCOUNTER — Other Ambulatory Visit: Payer: Self-pay

## 2020-09-10 ENCOUNTER — Ambulatory Visit (INDEPENDENT_AMBULATORY_CARE_PROVIDER_SITE_OTHER)
Admission: RE | Admit: 2020-09-10 | Discharge: 2020-09-10 | Disposition: A | Discharge status: Home / Self Care | Payer: 59 | Source: Ambulatory Visit | Attending: Internal Medicine | Admitting: Internal Medicine

## 2020-09-10 DIAGNOSIS — K449 Diaphragmatic hernia without obstruction or gangrene: Secondary | ICD-10-CM

## 2020-09-11 ENCOUNTER — Other Ambulatory Visit: Payer: Self-pay | Admitting: *Deleted

## 2020-09-11 ENCOUNTER — Encounter: Payer: Self-pay | Admitting: Thoracic Surgery (Cardiothoracic Vascular Surgery)

## 2020-09-11 ENCOUNTER — Institutional Professional Consult (permissible substitution) (INDEPENDENT_AMBULATORY_CARE_PROVIDER_SITE_OTHER): Payer: 59 | Admitting: Thoracic Surgery (Cardiothoracic Vascular Surgery)

## 2020-09-11 VITALS — BP 155/99 | HR 56 | Temp 98.8°F | Resp 20 | Ht 67.0 in | Wt 188.2 lb

## 2020-09-11 DIAGNOSIS — K449 Diaphragmatic hernia without obstruction or gangrene: Secondary | ICD-10-CM

## 2020-09-11 NOTE — H&P (View-Only) (Signed)
[address]       [place] [address]             [phone]                    Jerry Kramer Medical Record #[id number] Date of Birth: 1955/10/28  Referring: Jerene Bears, MD Primary Care: Eulas Post, MD Primary Cardiologist: No primary care provider on file.  Chief Complaint:    Chief Complaint  Patient presents with  . Hiatal Hernia    Surgical consult, Chest CT 09/10/20, ESOPHOGRAM / BARIUM SWALLOW  01/14/20    History of Present Illness:    Jerry Kramer 65 y.o. male who is self-referred for surgical evaluation of a hiatal hernia along with an esophageal stricture.  The patient has had a long history of gastroesophageal reflux which has been managed with antacids, but over the last several months he has noted episodes of gagging and dysphagia if he attempts to eat too fast.  He has been seen by gastroenterologist and underwent an upper endoscopy with dilation in July 2021.  He was noted to have a moderate lower esophageal stricture along with a hiatal hernia.  In February 2022 he underwent manometry along with repeat upper endoscopy which showed a mild stenosis at 34 cm from the incisors.  Regards to his symptoms he only complains of dysphagia when he does not chew his food well.  His reflux symptoms have been well controlled with his medications.  He does not have a chronic cough or respiratory symptoms.  He denies any dysphagia.    Smoking Hx: Lifelong non-smoker   Zubrod Score: At the time of surgery this patient's most appropriate activity status/level should be described as: [x]     0    Normal activity, no symptoms []     1    Restricted in physical strenuous activity but ambulatory, able to do out light work []     2    Ambulatory and capable of self care, unable to do work activities, up and about               >50 % of waking hours                              []     3    Only limited self care, in bed greater than 50% of waking  hours []     4    Completely disabled, no self care, confined to bed or chair []     5    Moribund   Past Medical History:  Diagnosis Date  . Allergy   . Colon polyps   . CONTACT DERMATITIS 09/03/2009  . ELEVATED BLOOD PRESSURE 06/19/2010  . Hyperlipidemia   . HYPERLIPIDEMIA 08/27/2009  . UNSPECIFIED OTALGIA 06/19/2010    Past Surgical History:  Procedure Laterality Date  . APPENDECTOMY  1983  . BIOPSY  08/06/2020   Procedure: BIOPSY;  Surgeon: Lavena Bullion, DO;  Location: [facility];  Service: Gastroenterology;;  esophageal manometry probe  placement  . ESOPHAGEAL MANOMETRY N/A 08/06/2020   Procedure: ESOPHAGEAL MANOMETRY (EM);  Surgeon: Lavena Bullion, DO;  Location: [facility];  Service: Gastroenterology;  Laterality: N/A;  . ESOPHAGOGASTRODUODENOSCOPY (EGD) WITH PROPOFOL N/A 08/06/2020   Procedure: ESOPHAGOGASTRODUODENOSCOPY (EGD) WITH PROPOFOL;  Surgeon: Lavena Bullion, DO;  Location: [facility];  Service: Gastroenterology;  Laterality:  N/A;  . EYE MUSCLE SURGERY Right   . MIDDLE EAR SURGERY    . ROTATOR CUFF REPAIR Left   . TONSILLECTOMY  1965    Family History  Problem Relation Age of Onset  . Heart disease Mother 70       CAD  . Cancer Mother        lung  . Hyperlipidemia Father   . Stroke Father   . Colon polyps Father   . Diabetes Sister        type II  . Heart attack Brother 26  . Heart disease Brother 71       MI  . Colon cancer Neg Hx   . Esophageal cancer Neg Hx   . Pancreatic cancer Neg Hx   . Stomach cancer Neg Hx   . Rectal cancer Neg Hx      Social History   Tobacco Use  Smoking Status Never Smoker  Smokeless Tobacco Never Used    Social History   Substance and Sexual Activity  Alcohol Use No     Allergies  Allergen Reactions  . Meperidine Hcl Nausea And Vomiting  . Penicillins Other (See Comments)    REACTION: Childhood  . Advil [Ibuprofen] Palpitations  . Pseudoephedrine Hcl Palpitations    Current Outpatient  Medications  Medication Sig Dispense Refill  . pantoprazole (PROTONIX) 40 MG tablet Take 1 tablet (40 mg total) by mouth daily. 30 tablet 11  . sildenafil (VIAGRA) 100 MG tablet Take 1 tablet (100 mg total) by mouth daily as needed for erectile dysfunction. 10 tablet 5   Current Facility-Administered Medications  Medication Dose Route Frequency Provider Last Rate Last Admin  . 0.9 %  sodium chloride infusion  500 mL Intravenous Once Pyrtle, Lajuan Lines, MD      . 0.9 %  sodium chloride infusion  500 mL Intravenous Once Pyrtle, Lajuan Lines, MD        Review of Systems  Constitutional: Negative.   HENT: Positive for sore throat.   Respiratory: Negative.   Cardiovascular: Negative.   Gastrointestinal: Positive for vomiting. Negative for abdominal pain, heartburn and nausea.  Musculoskeletal: Negative.   Neurological: Negative.      PHYSICAL EXAMINATION: BP (!) 155/99 (BP Location: Right Arm, Patient Position: Sitting, Cuff Size: Large)   Pulse (!) 56   Temp 98.8 F (37.1 C) (Skin)   Resp 20   Ht 5\' 7"  (1.702 m)   Wt 188 lb 3.2 oz (85.4 kg)   SpO2 99% Comment: RA  BMI 29.48 kg/m  Physical Exam Constitutional:      Appearance: Normal appearance. He is normal weight.  HENT:     Head: Normocephalic and atraumatic.  Eyes:     Extraocular Movements: Extraocular movements intact.  Cardiovascular:     Rate and Rhythm: Bradycardia present.  Pulmonary:     Effort: Pulmonary effort is normal. No respiratory distress.     Breath sounds: Normal breath sounds.  Abdominal:     General: Abdomen is flat. There is no distension.     Palpations: Abdomen is soft.    Musculoskeletal:        General: Normal range of motion.     Cervical back: Normal range of motion.  Skin:    General: Skin is warm and dry.  Neurological:     General: No focal deficit present.     Mental Status: He is alert and oriented to person, place, and time.     Diagnostic Studies &  Laboratory data:    CT Scan:  Moderate sized hiatal hernia to the level of the lower left atrium.  Esophageal thickening.   Manometry: Normal resting pressures, will concern for extrinsic compression versus stricture..      I have independently reviewed the above radiology studies  and reviewed the findings with the patient.   Recent Lab Findings: Lab Results  Component Value Date   WBC 5.1 11/09/2017   HGB 15.7 11/09/2017   HCT 45.7 11/09/2017   PLT 214.0 11/09/2017   GLUCOSE 102 (H) 11/09/2017   CHOL 156 11/09/2017   TRIG 103.0 11/09/2017   HDL 39.90 11/09/2017   LDLCALC 96 11/09/2017   ALT 21 11/09/2017   AST 20 11/09/2017   NA 139 11/09/2017   K 4.4 11/09/2017   CL 104 11/09/2017   CREATININE 1.04 11/09/2017   BUN 15 11/09/2017   CO2 26 11/09/2017   TSH 1.45 11/09/2017      Assessment / Plan:   65 year old male with hernia along with esophageal stricture.  We had a long discussion in regards to the risks and benefits of proceeding with a robotic assisted hiatal hernia repair with fundoplication.  I think that his main indication is the fact that he has a esophageal stricture.  Furthermore explained to him that given his stricture he may continue to have some dysphagia but at least with a fundoplication that this potentially would prevent the progression of this.  Based on the integrity of his muscle I will make a decision about using mesh intraoperatively.  He is agreeable to proceed and is tentatively scheduled for the 21st of this month.     I  spent 40 minutes with the patient face to face counseling and coordination of care.    Lajuana Matte 09/11/2020 3:27 PM

## 2020-09-11 NOTE — Progress Notes (Signed)
[address]       [place], [address]             [phone]                    Burrell G Fier [place] Medical Record #[id number] Date of Birth: 01/23/1956  Referring: Jerene Bears, MD Primary Care: Eulas Post, MD Primary Cardiologist: No primary care provider on file.  Chief Complaint:    Chief Complaint  Patient presents with  . Hiatal Hernia    Surgical consult, Chest CT 09/10/20, ESOPHOGRAM / BARIUM SWALLOW  01/14/20    History of Present Illness:    Jerry Kramer 65 y.o. male who is self-referred for surgical evaluation of a hiatal hernia along with an esophageal stricture.  The patient has had a long history of gastroesophageal reflux which has been managed with antacids, but over the last several months he has noted episodes of gagging and dysphagia if he attempts to eat too fast.  He has been seen by gastroenterologist and underwent an upper endoscopy with dilation in July 2021.  He was noted to have a moderate lower esophageal stricture along with a hiatal hernia.  In February 2022 he underwent manometry along with repeat upper endoscopy which showed a mild stenosis at 34 cm from the incisors.  Regards to his symptoms he only complains of dysphagia when he does not chew his food well.  His reflux symptoms have been well controlled with his medications.  He does not have a chronic cough or respiratory symptoms.  He denies any dysphagia.    Smoking Hx: Lifelong non-smoker   Zubrod Score: At the time of surgery this patient's most appropriate activity status/level should be described as: [x]     0    Normal activity, no symptoms []     1    Restricted in physical strenuous activity but ambulatory, able to do out light work []     2    Ambulatory and capable of self care, unable to do work activities, up and about               >50 % of waking hours                              []     3    Only limited self care, in bed greater than 50% of waking  hours []     4    Completely disabled, no self care, confined to bed or chair []     5    Moribund   Past Medical History:  Diagnosis Date  . Allergy   . Colon polyps   . CONTACT DERMATITIS 09/03/2009  . ELEVATED BLOOD PRESSURE 06/19/2010  . Hyperlipidemia   . HYPERLIPIDEMIA 08/27/2009  . UNSPECIFIED OTALGIA 06/19/2010    Past Surgical History:  Procedure Laterality Date  . APPENDECTOMY  1983  . BIOPSY  08/06/2020   Procedure: BIOPSY;  Surgeon: Lavena Bullion, DO;  Location: [facility];  Service: Gastroenterology;;  esophageal manometry probe  placement  . ESOPHAGEAL MANOMETRY N/A 08/06/2020   Procedure: ESOPHAGEAL MANOMETRY (EM);  Surgeon: Lavena Bullion, DO;  Location: [facility];  Service: Gastroenterology;  Laterality: N/A;  . ESOPHAGOGASTRODUODENOSCOPY (EGD) WITH PROPOFOL N/A 08/06/2020   Procedure: ESOPHAGOGASTRODUODENOSCOPY (EGD) WITH PROPOFOL;  Surgeon: Lavena Bullion, DO;  Location: [facility];  Service: Gastroenterology;  Laterality:  N/A;  . EYE MUSCLE SURGERY Right   . MIDDLE EAR SURGERY    . ROTATOR CUFF REPAIR Left   . TONSILLECTOMY  1965    Family History  Problem Relation Age of Onset  . Heart disease Mother 28       CAD  . Cancer Mother        lung  . Hyperlipidemia Father   . Stroke Father   . Colon polyps Father   . Diabetes Sister        type II  . Heart attack Brother 61  . Heart disease Brother 95       MI  . Colon cancer Neg Hx   . Esophageal cancer Neg Hx   . Pancreatic cancer Neg Hx   . Stomach cancer Neg Hx   . Rectal cancer Neg Hx      Social History   Tobacco Use  Smoking Status Never Smoker  Smokeless Tobacco Never Used    Social History   Substance and Sexual Activity  Alcohol Use No     Allergies  Allergen Reactions  . Meperidine Hcl Nausea And Vomiting  . Penicillins Other (See Comments)    REACTION: Childhood  . Advil [Ibuprofen] Palpitations  . Pseudoephedrine Hcl Palpitations    Current Outpatient  Medications  Medication Sig Dispense Refill  . pantoprazole (PROTONIX) 40 MG tablet Take 1 tablet (40 mg total) by mouth daily. 30 tablet 11  . sildenafil (VIAGRA) 100 MG tablet Take 1 tablet (100 mg total) by mouth daily as needed for erectile dysfunction. 10 tablet 5   Current Facility-Administered Medications  Medication Dose Route Frequency Provider Last Rate Last Admin  . 0.9 %  sodium chloride infusion  500 mL Intravenous Once Pyrtle, Lajuan Lines, MD      . 0.9 %  sodium chloride infusion  500 mL Intravenous Once Pyrtle, Lajuan Lines, MD        Review of Systems  Constitutional: Negative.   HENT: Positive for sore throat.   Respiratory: Negative.   Cardiovascular: Negative.   Gastrointestinal: Positive for vomiting. Negative for abdominal pain, heartburn and nausea.  Musculoskeletal: Negative.   Neurological: Negative.      PHYSICAL EXAMINATION: BP (!) 155/99 (BP Location: Right Arm, Patient Position: Sitting, Cuff Size: Large)   Pulse (!) 56   Temp 98.8 F (37.1 C) (Skin)   Resp 20   Ht 5\' 7"  (1.702 m)   Wt 188 lb 3.2 oz (85.4 kg)   SpO2 99% Comment: RA  BMI 29.48 kg/m  Physical Exam Constitutional:      Appearance: Normal appearance. He is normal weight.  HENT:     Head: Normocephalic and atraumatic.  Eyes:     Extraocular Movements: Extraocular movements intact.  Cardiovascular:     Rate and Rhythm: Bradycardia present.  Pulmonary:     Effort: Pulmonary effort is normal. No respiratory distress.     Breath sounds: Normal breath sounds.  Abdominal:     General: Abdomen is flat. There is no distension.     Palpations: Abdomen is soft.    Musculoskeletal:        General: Normal range of motion.     Cervical back: Normal range of motion.  Skin:    General: Skin is warm and dry.  Neurological:     General: No focal deficit present.     Mental Status: He is alert and oriented to person, place, and time.     Diagnostic Studies &  Laboratory data:    CT Scan:  Moderate sized hiatal hernia to the level of the lower left atrium.  Esophageal thickening.   Manometry: Normal resting pressures, will concern for extrinsic compression versus stricture..      I have independently reviewed the above radiology studies  and reviewed the findings with the patient.   Recent Lab Findings: Lab Results  Component Value Date   WBC 5.1 11/09/2017   HGB 15.7 11/09/2017   HCT 45.7 11/09/2017   PLT 214.0 11/09/2017   GLUCOSE 102 (H) 11/09/2017   CHOL 156 11/09/2017   TRIG 103.0 11/09/2017   HDL 39.90 11/09/2017   LDLCALC 96 11/09/2017   ALT 21 11/09/2017   AST 20 11/09/2017   NA 139 11/09/2017   K 4.4 11/09/2017   CL 104 11/09/2017   CREATININE 1.04 11/09/2017   BUN 15 11/09/2017   CO2 26 11/09/2017   TSH 1.45 11/09/2017      Assessment / Plan:   65 year old male with hernia along with esophageal stricture.  We had a long discussion in regards to the risks and benefits of proceeding with a robotic assisted hiatal hernia repair with fundoplication.  I think that his main indication is the fact that he has a esophageal stricture.  Furthermore explained to him that given his stricture he may continue to have some dysphagia but at least with a fundoplication that this potentially would prevent the progression of this.  Based on the integrity of his muscle I will make a decision about using mesh intraoperatively.  He is agreeable to proceed and is tentatively scheduled for the 21st of this month.     I  spent 40 minutes with the patient face to face counseling and coordination of care.    Lajuana Matte 09/11/2020 3:27 PM

## 2020-09-12 ENCOUNTER — Other Ambulatory Visit: Payer: Self-pay

## 2020-09-12 ENCOUNTER — Encounter (HOSPITAL_COMMUNITY)
Admission: RE | Admit: 2020-09-12 | Discharge: 2020-09-12 | Disposition: A | Discharge status: Home / Self Care | Payer: 59 | Source: Ambulatory Visit | Attending: Thoracic Surgery (Cardiothoracic Vascular Surgery) | Admitting: Thoracic Surgery (Cardiothoracic Vascular Surgery)

## 2020-09-12 ENCOUNTER — Telehealth: Payer: Self-pay

## 2020-09-12 ENCOUNTER — Other Ambulatory Visit (HOSPITAL_COMMUNITY)
Admission: RE | Admit: 2020-09-12 | Discharge: 2020-09-12 | Disposition: A | Discharge status: Home / Self Care | Payer: 59 | Source: Ambulatory Visit | Attending: Thoracic Surgery (Cardiothoracic Vascular Surgery) | Admitting: Thoracic Surgery (Cardiothoracic Vascular Surgery)

## 2020-09-12 ENCOUNTER — Encounter (HOSPITAL_COMMUNITY): Payer: Self-pay

## 2020-09-12 DIAGNOSIS — I493 Ventricular premature depolarization: Secondary | ICD-10-CM | POA: Insufficient documentation

## 2020-09-12 DIAGNOSIS — Z20822 Contact with and (suspected) exposure to covid-19: Secondary | ICD-10-CM | POA: Insufficient documentation

## 2020-09-12 DIAGNOSIS — Z01818 Encounter for other preprocedural examination: Secondary | ICD-10-CM | POA: Diagnosis not present

## 2020-09-12 DIAGNOSIS — Z01812 Encounter for preprocedural laboratory examination: Secondary | ICD-10-CM | POA: Insufficient documentation

## 2020-09-12 DIAGNOSIS — K449 Diaphragmatic hernia without obstruction or gangrene: Secondary | ICD-10-CM

## 2020-09-12 HISTORY — DX: Other complications of anesthesia, initial encounter: T88.59XA

## 2020-09-12 LAB — BLOOD GAS, ARTERIAL
Acid-base deficit: 2.8 mmol/L — ABNORMAL HIGH (ref 0.0–2.0)
Bicarbonate: 20.8 mmol/L (ref 20.0–28.0)
Drawn by: 58793
FIO2: 21
O2 Saturation: 96.9 %
Patient temperature: 37
pCO2 arterial: 31.5 mmHg — ABNORMAL LOW (ref 32.0–48.0)
pH, Arterial: 7.434 (ref 7.350–7.450)
pO2, Arterial: 88.7 mmHg (ref 83.0–108.0)

## 2020-09-12 LAB — COMPREHENSIVE METABOLIC PANEL
ALT: 22 U/L (ref 0–44)
AST: 26 U/L (ref 15–41)
Albumin: 4.1 g/dL (ref 3.5–5.0)
Alkaline Phosphatase: 72 U/L (ref 38–126)
Anion gap: 7 (ref 5–15)
BUN: 14 mg/dL (ref 8–23)
CO2: 20 mmol/L — ABNORMAL LOW (ref 22–32)
Calcium: 9.1 mg/dL (ref 8.9–10.3)
Chloride: 110 mmol/L (ref 98–111)
Creatinine, Ser: 1.02 mg/dL (ref 0.61–1.24)
GFR, Estimated: >60 mL/min (ref >60)
Glucose, Bld: 147 mg/dL — ABNORMAL HIGH (ref 70–99)
Potassium: 3.8 mmol/L (ref 3.5–5.1)
Sodium: 137 mmol/L (ref 135–145)
Total Bilirubin: 0.7 mg/dL (ref 0.3–1.2)
Total Protein: 6.5 g/dL (ref 6.5–8.1)

## 2020-09-12 LAB — CBC
HCT: 40.6 % (ref 39.0–52.0)
Hemoglobin: 13.3 g/dL (ref 13.0–17.0)
MCH: 26.8 pg (ref 26.0–34.0)
MCHC: 32.8 g/dL (ref 30.0–36.0)
MCV: 81.7 fL (ref 80.0–100.0)
Platelets: 198 10*3/uL (ref 150–400)
RBC: 4.97 MIL/uL (ref 4.22–5.81)
RDW: 15.5 % (ref 11.5–15.5)
WBC: 5 10*3/uL (ref 4.0–10.5)
nRBC: 0 % (ref 0.0–0.2)

## 2020-09-12 LAB — URINALYSIS, ROUTINE W REFLEX MICROSCOPIC
Bilirubin Urine: NEGATIVE
Glucose, UA: NEGATIVE mg/dL
Hgb urine dipstick: NEGATIVE
Ketones, ur: NEGATIVE mg/dL
Leukocytes,Ua: NEGATIVE
Nitrite: NEGATIVE
Protein, ur: NEGATIVE mg/dL
Specific Gravity, Urine: 1.015 (ref 1.005–1.030)
pH: 5 (ref 5.0–8.0)

## 2020-09-12 LAB — SURGICAL PCR SCREEN
MRSA, PCR: NEGATIVE
Staphylococcus aureus: NEGATIVE

## 2020-09-12 LAB — PROTIME-INR
INR: 1.1 (ref 0.8–1.2)
Prothrombin Time: 13.3 seconds (ref 11.4–15.2)

## 2020-09-12 LAB — APTT: aPTT: 27 seconds (ref 24–36)

## 2020-09-12 NOTE — Progress Notes (Signed)
Surgical Instructions    Your procedure is scheduled on 09/15/20.  Report to [facility] Main Entrance "A" at 5:30 A.M., then check in with the Admitting office.  Call this number if you have problems the morning of surgery:  [phone]   If you have any questions prior to your surgery date call [phone]: Open Monday-Friday 8am-4pm    Remember:  Do not eat OR drink after midnight the night before your surgery    Take these medicines the morning of surgery with A SIP OF WATER: pantoprazole (PROTONIX)  As of today, STOP taking any Aspirin (unless otherwise instructed by your surgeon) Aleve, Naproxen, Ibuprofen, Motrin, Advil, Goody's, BC's, all herbal medications, fish oil, and all vitamins.                     Do not wear jewelry            Do not wear lotions, powders, perfumes or deodorant.            Do not shave 48 hours prior to surgery.              Do not bring valuables to the hospital.            [facility] is not responsible for any belongings or valuables.  Do NOT Smoke (Tobacco/Vaping) or drink Alcohol 24 hours prior to your procedure If you use a CPAP at night, you may bring all equipment for your overnight stay.   Contacts, glasses, dentures or bridgework may not be worn into surgery, please bring cases for these belongings   For patients admitted to the hospital, discharge time will be determined by your treatment team.   Patients discharged the day of surgery will not be allowed to drive home, and someone needs to stay with them for 24 hours.    Special instructions:   [facility]- Preparing For Surgery  Before surgery, you can play an important role. Because skin is not sterile, your skin needs to be as free of germs as possible. You can reduce the number of germs on your skin by washing with CHG (chlorahexidine gluconate) Soap before surgery.  CHG is an antiseptic cleaner which kills germs and bonds with the skin to continue killing germs even after  washing.    Oral Hygiene is also important to reduce your risk of infection.  Remember - BRUSH YOUR TEETH THE MORNING OF SURGERY WITH YOUR REGULAR TOOTHPASTE  Please do not use if you have an allergy to CHG or antibacterial soaps. If your skin becomes reddened/irritated stop using the CHG.  Do not shave (including legs and underarms) for at least 48 hours prior to first CHG shower. It is OK to shave your face.  Please follow these instructions carefully.   1. Shower the NIGHT BEFORE SURGERY and the MORNING OF SURGERY  2. If you chose to wash your hair, wash your hair first as usual with your normal shampoo.  3. After you shampoo, rinse your hair and body thoroughly to remove the shampoo.  4. Wash Face and genitals (private parts) with your normal soap.   5.  Shower the NIGHT BEFORE SURGERY and the MORNING OF SURGERY with CHG Soap.   6. Use CHG Soap as you would any other liquid soap. You can apply CHG directly to the skin and wash gently with a scrungie or a clean washcloth.   7. Apply the CHG Soap to your body ONLY FROM THE NECK DOWN.  Do not use on open wounds or open sores. Avoid contact with your eyes, ears, mouth and genitals (private parts). Wash Face and genitals (private parts)  with your normal soap.   8. Wash thoroughly, paying special attention to the area where your surgery will be performed.  9. Thoroughly rinse your body with warm water from the neck down.  10. DO NOT shower/wash with your normal soap after using and rinsing off the CHG Soap.  11. Pat yourself dry with a CLEAN TOWEL.  12. Wear CLEAN PAJAMAS to bed the night before surgery  13. Place CLEAN SHEETS on your bed the night before your surgery  14. DO NOT SLEEP WITH PETS.   Day of Surgery: Wear Clean/Comfortable clothing the morning of surgery Do not apply any deodorants/lotions.   Remember to brush your teeth WITH YOUR REGULAR TOOTHPASTE.   Please read over the following fact sheets that you were  given.

## 2020-09-12 NOTE — Progress Notes (Addendum)
PCP:  Carolann Littler, MD Cardiologist: Denies  EKG: 09/12/20 CXR: 09/12/20 ECHO: Denies Stress Test: Denies Cardiac Cath: Denies  Fasting Blood Sugar- na Checks Blood Sugar__na_ times a day  OSA/CPAP: No  ASA/Blood Thinners: No  Covid test 09/12/20  Anesthesia Review: yes, per patient daughter (RN at [facility]), patient had reactive airway with an EGD on 08/06/20  Patient denies shortness of breath, fever, cough, and chest pain at PAT appointment.  Patient verbalized understanding of instructions provided today at the PAT appointment.  Patient asked to review instructions at home and day of surgery.

## 2020-09-12 NOTE — Telephone Encounter (Signed)
FMLA form completed/ patient will pick up at front desk. Beginning leave 09/12/20 through approx 11/03/20

## 2020-09-13 LAB — SARS CORONAVIRUS 2 (TAT 6-24 HRS): SARS Coronavirus 2: NEGATIVE

## 2020-09-14 NOTE — Anesthesia Preprocedure Evaluation (Addendum)
Anesthesia Evaluation  Patient identified by MRN, date of birth, ID band Patient awake    Reviewed: Allergy & Precautions, NPO status , Patient's Chart, lab work & pertinent test results  History of Anesthesia Complications (+) DIFFICULT AIRWAY, PROLONGED EMERGENCE and history of anesthetic complications  Airway Mallampati: IV  TM Distance: <3 FB Neck ROM: Full    Dental  (+) Teeth Intact, Dental Advisory Given, Caps,    Pulmonary neg pulmonary ROS,    Pulmonary exam normal breath sounds clear to auscultation       Cardiovascular negative cardio ROS Normal cardiovascular exam Rhythm:Regular Rate:Normal     Neuro/Psych negative neurological ROS     GI/Hepatic Neg liver ROS, hiatal hernia, GERD  Medicated and Controlled,  Endo/Other  negative endocrine ROS  Renal/GU negative Renal ROS     Musculoskeletal negative musculoskeletal ROS (+)   Abdominal   Peds  Hematology negative hematology ROS (+)   Anesthesia Other Findings   Reproductive/Obstetrics                            Anesthesia Physical Anesthesia Plan  ASA: II  Anesthesia Plan: General   Post-op Pain Management:    Induction: Intravenous  PONV Risk Score and Plan: 3 and Midazolam, Dexamethasone and Ondansetron  Airway Management Planned: Double Lumen EBT, Oral ETT and Video Laryngoscope Planned  Additional Equipment: Arterial line  Intra-op Plan:   Post-operative Plan: Extubation in OR  Informed Consent: I have reviewed the patients History and Physical, chart, labs and discussed the procedure including the risks, benefits and alternatives for the proposed anesthesia with the patient or authorized representative who has indicated his/her understanding and acceptance.     Dental advisory given  Plan Discussed with: CRNA  Anesthesia Plan Comments: (2 large bore PIV)     Anesthesia Quick Evaluation

## 2020-09-15 ENCOUNTER — Inpatient Hospital Stay (HOSPITAL_COMMUNITY): Payer: 59

## 2020-09-15 ENCOUNTER — Inpatient Hospital Stay (HOSPITAL_COMMUNITY): Payer: 59 | Admitting: Physician Assistant

## 2020-09-15 ENCOUNTER — Inpatient Hospital Stay (HOSPITAL_COMMUNITY): Payer: 59 | Admitting: Anesthesiology

## 2020-09-15 ENCOUNTER — Inpatient Hospital Stay (HOSPITAL_COMMUNITY)
Admission: RE | Disposition: A | Discharge status: Home / Self Care | Payer: Self-pay | Source: Home / Self Care | Attending: Cardiology

## 2020-09-15 ENCOUNTER — Inpatient Hospital Stay (HOSPITAL_COMMUNITY)
Admission: RE | Admit: 2020-09-15 | Discharge: 2020-09-19 | DRG: 326 | Disposition: A | Discharge status: Home / Self Care | Payer: 59 | Attending: Cardiology | Admitting: Cardiology

## 2020-09-15 ENCOUNTER — Encounter (HOSPITAL_COMMUNITY)
Admission: RE | Disposition: A | Discharge status: Home / Self Care | Payer: Self-pay | Source: Home / Self Care | Attending: Cardiology

## 2020-09-15 ENCOUNTER — Other Ambulatory Visit: Payer: Self-pay

## 2020-09-15 ENCOUNTER — Encounter (HOSPITAL_COMMUNITY): Payer: Self-pay | Admitting: Thoracic Surgery (Cardiothoracic Vascular Surgery)

## 2020-09-15 DIAGNOSIS — I25110 Atherosclerotic heart disease of native coronary artery with unstable angina pectoris: Secondary | ICD-10-CM

## 2020-09-15 DIAGNOSIS — K449 Diaphragmatic hernia without obstruction or gangrene: Secondary | ICD-10-CM | POA: Diagnosis not present

## 2020-09-15 DIAGNOSIS — K440 Diaphragmatic hernia with obstruction, without gangrene: Secondary | ICD-10-CM | POA: Diagnosis not present

## 2020-09-15 DIAGNOSIS — D62 Acute posthemorrhagic anemia: Secondary | ICD-10-CM | POA: Diagnosis not present

## 2020-09-15 DIAGNOSIS — I2102 ST elevation (STEMI) myocardial infarction involving left anterior descending coronary artery: Secondary | ICD-10-CM | POA: Diagnosis not present

## 2020-09-15 DIAGNOSIS — Z20822 Contact with and (suspected) exposure to covid-19: Secondary | ICD-10-CM | POA: Diagnosis present

## 2020-09-15 DIAGNOSIS — N179 Acute kidney failure, unspecified: Secondary | ICD-10-CM | POA: Diagnosis present

## 2020-09-15 DIAGNOSIS — Z79899 Other long term (current) drug therapy: Secondary | ICD-10-CM | POA: Diagnosis not present

## 2020-09-15 DIAGNOSIS — I5021 Acute systolic (congestive) heart failure: Secondary | ICD-10-CM

## 2020-09-15 DIAGNOSIS — I110 Hypertensive heart disease with heart failure: Secondary | ICD-10-CM | POA: Diagnosis present

## 2020-09-15 DIAGNOSIS — Z452 Encounter for adjustment and management of vascular access device: Secondary | ICD-10-CM | POA: Diagnosis not present

## 2020-09-15 DIAGNOSIS — Z8249 Family history of ischemic heart disease and other diseases of the circulatory system: Secondary | ICD-10-CM | POA: Diagnosis not present

## 2020-09-15 DIAGNOSIS — J9601 Acute respiratory failure with hypoxia: Secondary | ICD-10-CM | POA: Diagnosis present

## 2020-09-15 DIAGNOSIS — K222 Esophageal obstruction: Secondary | ICD-10-CM | POA: Diagnosis present

## 2020-09-15 DIAGNOSIS — R739 Hyperglycemia, unspecified: Secondary | ICD-10-CM | POA: Diagnosis present

## 2020-09-15 DIAGNOSIS — K3189 Other diseases of stomach and duodenum: Secondary | ICD-10-CM | POA: Diagnosis present

## 2020-09-15 DIAGNOSIS — I493 Ventricular premature depolarization: Secondary | ICD-10-CM | POA: Diagnosis present

## 2020-09-15 DIAGNOSIS — Z955 Presence of coronary angioplasty implant and graft: Secondary | ICD-10-CM

## 2020-09-15 DIAGNOSIS — I2510 Atherosclerotic heart disease of native coronary artery without angina pectoris: Secondary | ICD-10-CM | POA: Diagnosis present

## 2020-09-15 DIAGNOSIS — I2119 ST elevation (STEMI) myocardial infarction involving other coronary artery of inferior wall: Secondary | ICD-10-CM | POA: Diagnosis not present

## 2020-09-15 DIAGNOSIS — Z978 Presence of other specified devices: Secondary | ICD-10-CM | POA: Diagnosis not present

## 2020-09-15 DIAGNOSIS — J9811 Atelectasis: Secondary | ICD-10-CM | POA: Diagnosis not present

## 2020-09-15 DIAGNOSIS — K219 Gastro-esophageal reflux disease without esophagitis: Secondary | ICD-10-CM

## 2020-09-15 DIAGNOSIS — J810 Acute pulmonary edema: Secondary | ICD-10-CM

## 2020-09-15 DIAGNOSIS — R570 Cardiogenic shock: Secondary | ICD-10-CM | POA: Diagnosis not present

## 2020-09-15 DIAGNOSIS — R339 Retention of urine, unspecified: Secondary | ICD-10-CM | POA: Diagnosis present

## 2020-09-15 DIAGNOSIS — K2990 Gastroduodenitis, unspecified, without bleeding: Secondary | ICD-10-CM | POA: Diagnosis not present

## 2020-09-15 DIAGNOSIS — I517 Cardiomegaly: Secondary | ICD-10-CM | POA: Diagnosis not present

## 2020-09-15 DIAGNOSIS — Z8719 Personal history of other diseases of the digestive system: Secondary | ICD-10-CM

## 2020-09-15 DIAGNOSIS — E785 Hyperlipidemia, unspecified: Secondary | ICD-10-CM | POA: Diagnosis present

## 2020-09-15 DIAGNOSIS — Z09 Encounter for follow-up examination after completed treatment for conditions other than malignant neoplasm: Secondary | ICD-10-CM

## 2020-09-15 DIAGNOSIS — I2109 ST elevation (STEMI) myocardial infarction involving other coronary artery of anterior wall: Secondary | ICD-10-CM | POA: Diagnosis not present

## 2020-09-15 DIAGNOSIS — J90 Pleural effusion, not elsewhere classified: Secondary | ICD-10-CM | POA: Diagnosis not present

## 2020-09-15 DIAGNOSIS — I5041 Acute combined systolic (congestive) and diastolic (congestive) heart failure: Secondary | ICD-10-CM | POA: Diagnosis present

## 2020-09-15 DIAGNOSIS — I213 ST elevation (STEMI) myocardial infarction of unspecified site: Secondary | ICD-10-CM | POA: Diagnosis not present

## 2020-09-15 DIAGNOSIS — K224 Dyskinesia of esophagus: Secondary | ICD-10-CM | POA: Diagnosis not present

## 2020-09-15 DIAGNOSIS — Z9889 Other specified postprocedural states: Secondary | ICD-10-CM

## 2020-09-15 HISTORY — PX: ESOPHAGOGASTRODUODENOSCOPY: SHX5428

## 2020-09-15 HISTORY — PX: CORONARY/GRAFT ACUTE MI REVASCULARIZATION: CATH118305

## 2020-09-15 HISTORY — PX: XI ROBOTIC ASSISTED HIATAL HERNIA REPAIR: SHX6889

## 2020-09-15 HISTORY — PX: LEFT HEART CATH AND CORONARY ANGIOGRAPHY: CATH118249

## 2020-09-15 LAB — POCT I-STAT 7, (LYTES, BLD GAS, ICA,H+H)
Acid-base deficit: 5 mmol/L — ABNORMAL HIGH (ref 0.0–2.0)
Acid-base deficit: 4 mmol/L — ABNORMAL HIGH (ref 0.0–2.0)
Acid-base deficit: 8 mmol/L — ABNORMAL HIGH (ref 0.0–2.0)
Acid-base deficit: 7 mmol/L — ABNORMAL HIGH (ref 0.0–2.0)
Bicarbonate: 21 mmol/L (ref 20.0–28.0)
Bicarbonate: 21.6 mmol/L (ref 20.0–28.0)
Bicarbonate: 16.2 mmol/L — ABNORMAL LOW (ref 20.0–28.0)
Bicarbonate: 19 mmol/L — ABNORMAL LOW (ref 20.0–28.0)
Calcium, Ion: 1.21 mmol/L (ref 1.15–1.40)
Calcium, Ion: 1.22 mmol/L (ref 1.15–1.40)
Calcium, Ion: 1.16 mmol/L (ref 1.15–1.40)
Calcium, Ion: 1.11 mmol/L — ABNORMAL LOW (ref 1.15–1.40)
HCT: 38 % — ABNORMAL LOW (ref 39.0–52.0)
HCT: 35 % — ABNORMAL LOW (ref 39.0–52.0)
HCT: 40 % (ref 39.0–52.0)
HCT: 38 % — ABNORMAL LOW (ref 39.0–52.0)
Hemoglobin: 12.9 g/dL — ABNORMAL LOW (ref 13.0–17.0)
Hemoglobin: 11.9 g/dL — ABNORMAL LOW (ref 13.0–17.0)
Hemoglobin: 13.6 g/dL (ref 13.0–17.0)
Hemoglobin: 12.9 g/dL — ABNORMAL LOW (ref 13.0–17.0)
O2 Saturation: 99 %
O2 Saturation: 99 %
O2 Saturation: 88 %
O2 Saturation: 100 %
Patient temperature: 97.8
Potassium: 4.8 mmol/L (ref 3.5–5.1)
Potassium: 4.7 mmol/L (ref 3.5–5.1)
Potassium: 3.8 mmol/L (ref 3.5–5.1)
Potassium: 4.6 mmol/L (ref 3.5–5.1)
Sodium: 138 mmol/L (ref 135–145)
Sodium: 138 mmol/L (ref 135–145)
Sodium: 139 mmol/L (ref 135–145)
Sodium: 135 mmol/L (ref 135–145)
TCO2: 22 mmol/L (ref 22–32)
TCO2: 23 mmol/L (ref 22–32)
TCO2: 17 mmol/L — ABNORMAL LOW (ref 22–32)
TCO2: 20 mmol/L — ABNORMAL LOW (ref 22–32)
pCO2 arterial: 40.8 mmHg (ref 32.0–48.0)
pCO2 arterial: 42.4 mmHg (ref 32.0–48.0)
pCO2 arterial: 28.4 mmHg — ABNORMAL LOW (ref 32.0–48.0)
pCO2 arterial: 38.9 mmHg (ref 32.0–48.0)
pH, Arterial: 7.32 — ABNORMAL LOW (ref 7.350–7.450)
pH, Arterial: 7.316 — ABNORMAL LOW (ref 7.350–7.450)
pH, Arterial: 7.363 (ref 7.350–7.450)
pH, Arterial: 7.296 — ABNORMAL LOW (ref 7.350–7.450)
pO2, Arterial: 131 mmHg — ABNORMAL HIGH (ref 83.0–108.0)
pO2, Arterial: 131 mmHg — ABNORMAL HIGH (ref 83.0–108.0)
pO2, Arterial: 56 mmHg — ABNORMAL LOW (ref 83.0–108.0)
pO2, Arterial: 266 mmHg — ABNORMAL HIGH (ref 83.0–108.0)

## 2020-09-15 LAB — HEMOGLOBIN A1C
Hgb A1c MFr Bld: 6 % — ABNORMAL HIGH (ref 4.8–5.6)
Mean Plasma Glucose: 125.5 mg/dL

## 2020-09-15 LAB — COMPREHENSIVE METABOLIC PANEL
ALT: 172 U/L — ABNORMAL HIGH (ref 0–44)
AST: 197 U/L — ABNORMAL HIGH (ref 15–41)
Albumin: 3.7 g/dL (ref 3.5–5.0)
Alkaline Phosphatase: 61 U/L (ref 38–126)
Anion gap: 10 (ref 5–15)
BUN: 16 mg/dL (ref 8–23)
CO2: 20 mmol/L — ABNORMAL LOW (ref 22–32)
Calcium: 8.1 mg/dL — ABNORMAL LOW (ref 8.9–10.3)
Chloride: 103 mmol/L (ref 98–111)
Creatinine, Ser: 1.46 mg/dL — ABNORMAL HIGH (ref 0.61–1.24)
GFR, Estimated: 54 mL/min — ABNORMAL LOW (ref >60)
Glucose, Bld: 253 mg/dL — ABNORMAL HIGH (ref 70–99)
Potassium: 4.9 mmol/L (ref 3.5–5.1)
Sodium: 133 mmol/L — ABNORMAL LOW (ref 135–145)
Total Bilirubin: 1 mg/dL (ref 0.3–1.2)
Total Protein: 6.1 g/dL — ABNORMAL LOW (ref 6.5–8.1)

## 2020-09-15 LAB — CBC
HCT: 43.8 % (ref 39.0–52.0)
HCT: 38.7 % — ABNORMAL LOW (ref 39.0–52.0)
Hemoglobin: 13.3 g/dL (ref 13.0–17.0)
Hemoglobin: 12.2 g/dL — ABNORMAL LOW (ref 13.0–17.0)
MCH: 26.2 pg (ref 26.0–34.0)
MCH: 26.7 pg (ref 26.0–34.0)
MCHC: 30.4 g/dL (ref 30.0–36.0)
MCHC: 31.5 g/dL (ref 30.0–36.0)
MCV: 86.2 fL (ref 80.0–100.0)
MCV: 84.7 fL (ref 80.0–100.0)
Platelets: 227 10*3/uL (ref 150–400)
Platelets: 207 10*3/uL (ref 150–400)
RBC: 5.08 MIL/uL (ref 4.22–5.81)
RBC: 4.57 MIL/uL (ref 4.22–5.81)
RDW: 15.6 % — ABNORMAL HIGH (ref 11.5–15.5)
RDW: 15.8 % — ABNORMAL HIGH (ref 11.5–15.5)
WBC: 15.4 10*3/uL — ABNORMAL HIGH (ref 4.0–10.5)
WBC: 13.5 10*3/uL — ABNORMAL HIGH (ref 4.0–10.5)
nRBC: 0 % (ref 0.0–0.2)
nRBC: 0 % (ref 0.0–0.2)

## 2020-09-15 LAB — ECHOCARDIOGRAM COMPLETE
Calc EF: 38.8 %
Height: 67 in
S' Lateral: 3.3 cm
Single Plane A2C EF: 39.3 %
Single Plane A4C EF: 39 %
Weight: 2960 oz

## 2020-09-15 LAB — POCT I-STAT, CHEM 8
BUN: 14 mg/dL (ref 8–23)
Calcium, Ion: 1.14 mmol/L — ABNORMAL LOW (ref 1.15–1.40)
Chloride: 105 mmol/L (ref 98–111)
Creatinine, Ser: 0.7 mg/dL (ref 0.61–1.24)
Glucose, Bld: 205 mg/dL — ABNORMAL HIGH (ref 70–99)
HCT: 40 % (ref 39.0–52.0)
Hemoglobin: 13.6 g/dL (ref 13.0–17.0)
Potassium: 3.8 mmol/L (ref 3.5–5.1)
Sodium: 138 mmol/L (ref 135–145)
TCO2: 17 mmol/L — ABNORMAL LOW (ref 22–32)

## 2020-09-15 LAB — URINALYSIS, ROUTINE W REFLEX MICROSCOPIC
Bacteria, UA: NONE SEEN
Bilirubin Urine: NEGATIVE
Glucose, UA: NEGATIVE mg/dL
Ketones, ur: NEGATIVE mg/dL
Leukocytes,Ua: NEGATIVE
Nitrite: NEGATIVE
Protein, ur: NEGATIVE mg/dL
RBC / HPF: >50 RBC/hpf — ABNORMAL HIGH (ref 0–5)
Specific Gravity, Urine: 1.025 (ref 1.005–1.030)
pH: 5 (ref 5.0–8.0)

## 2020-09-15 LAB — ABO/RH: ABO/RH(D): O POS

## 2020-09-15 LAB — TROPONIN I (HIGH SENSITIVITY)
Troponin I (High Sensitivity): 107 ng/L (ref <18)
Troponin I (High Sensitivity): >27000 ng/L (ref <18)

## 2020-09-15 LAB — COOXEMETRY PANEL
Carboxyhemoglobin: 0.8 % (ref 0.5–1.5)
Methemoglobin: 1.2 % (ref 0.0–1.5)
O2 Saturation: 67.7 %
Total hemoglobin: 13.4 g/dL (ref 12.0–16.0)

## 2020-09-15 LAB — PREPARE RBC (CROSSMATCH)

## 2020-09-15 LAB — POCT ACTIVATED CLOTTING TIME
Activated Clotting Time: 213 seconds
Activated Clotting Time: 267 seconds
Activated Clotting Time: 285 seconds

## 2020-09-15 LAB — GLUCOSE, CAPILLARY: Glucose-Capillary: 226 mg/dL — ABNORMAL HIGH (ref 70–99)

## 2020-09-15 LAB — LACTIC ACID, PLASMA: Lactic Acid, Venous: 5 mmol/L (ref 0.5–1.9)

## 2020-09-15 LAB — MAGNESIUM: Magnesium: 1.5 mg/dL — ABNORMAL LOW (ref 1.7–2.4)

## 2020-09-15 SURGERY — LEFT HEART CATH AND CORONARY ANGIOGRAPHY
Anesthesia: LOCAL

## 2020-09-15 SURGERY — REPAIR, HERNIA, HIATAL, ROBOT-ASSISTED
Anesthesia: General | Site: Chest

## 2020-09-15 MED ORDER — CEFAZOLIN SODIUM-DEXTROSE 2-4 GM/100ML-% IV SOLN
2.0000 g | Freq: Three times a day (TID) | INTRAVENOUS | Status: AC
  Administered 2020-09-15: 2 g via INTRAVENOUS
  Filled 2020-09-15 (×2): qty 100

## 2020-09-15 MED ORDER — NITROGLYCERIN 1 MG/10 ML FOR IR/CATH LAB
INTRA_ARTERIAL | Status: DC | PRN
  Administered 2020-09-15: 200 ug via INTRACORONARY

## 2020-09-15 MED ORDER — MIDAZOLAM HCL 2 MG/2ML IJ SOLN
INTRAMUSCULAR | Status: AC
  Filled 2020-09-15: qty 2

## 2020-09-15 MED ORDER — ASPIRIN 81 MG PO CHEW: 81.0000 mg | CHEWABLE_TABLET | Freq: Every day | ORAL | Status: DC

## 2020-09-15 MED ORDER — TICAGRELOR 90 MG PO TABS
90.0000 mg | ORAL_TABLET | Freq: Two times a day (BID) | ORAL | Status: DC
  Filled 2020-09-15: qty 1

## 2020-09-15 MED ORDER — CHLORHEXIDINE GLUCONATE 0.12% ORAL RINSE (MEDLINE KIT)
15.0000 mL | Freq: Two times a day (BID) | OROMUCOSAL | Status: DC
  Administered 2020-09-15 – 2020-09-16 (×2): 15 mL via OROMUCOSAL

## 2020-09-15 MED ORDER — AMIODARONE HCL IN DEXTROSE 360-4.14 MG/200ML-% IV SOLN
60.0000 mg/h | INTRAVENOUS | Status: AC
  Administered 2020-09-15 (×2): 60 mg/h via INTRAVENOUS
  Filled 2020-09-15: qty 200

## 2020-09-15 MED ORDER — SODIUM CHLORIDE 0.9 % IV SOLN: 250.0000 mL | INTRAVENOUS | Status: DC

## 2020-09-15 MED ORDER — LIDOCAINE 2% (20 MG/ML) 5 ML SYRINGE
INTRAMUSCULAR | Status: DC | PRN
  Administered 2020-09-15: 80 mg via INTRAVENOUS

## 2020-09-15 MED ORDER — METOPROLOL TARTRATE 5 MG/5ML IV SOLN: 2.5000 mg | Freq: Three times a day (TID) | INTRAVENOUS | Status: DC

## 2020-09-15 MED ORDER — FENTANYL CITRATE (PF) 100 MCG/2ML IJ SOLN
INTRAMUSCULAR | Status: AC
  Filled 2020-09-15: qty 2

## 2020-09-15 MED ORDER — ONDANSETRON HCL 4 MG/2ML IJ SOLN
INTRAMUSCULAR | Status: AC
  Filled 2020-09-15: qty 2

## 2020-09-15 MED ORDER — FENTANYL CITRATE (PF) 250 MCG/5ML IJ SOLN
INTRAMUSCULAR | Status: AC
  Filled 2020-09-15: qty 5

## 2020-09-15 MED ORDER — CHLORHEXIDINE GLUCONATE 0.12 % MT SOLN: 15.0000 mL | Freq: Once | OROMUCOSAL | Status: AC

## 2020-09-15 MED ORDER — NITROGLYCERIN 1 MG/10 ML FOR IR/CATH LAB
INTRA_ARTERIAL | Status: AC
  Filled 2020-09-15: qty 10

## 2020-09-15 MED ORDER — AMIODARONE HCL IN DEXTROSE 360-4.14 MG/200ML-% IV SOLN
INTRAVENOUS | Status: AC
  Filled 2020-09-15: qty 200

## 2020-09-15 MED ORDER — HEPARIN SODIUM (PORCINE) 1000 UNIT/ML IJ SOLN
4000.0000 [IU] | Freq: Once | INTRAMUSCULAR | Status: AC
  Administered 2020-09-15: 4000 [IU] via INTRAVENOUS

## 2020-09-15 MED ORDER — SUGAMMADEX SODIUM 200 MG/2ML IV SOLN
INTRAVENOUS | Status: DC | PRN
  Administered 2020-09-15: 200 mg via INTRAVENOUS

## 2020-09-15 MED ORDER — IOHEXOL 350 MG/ML SOLN
INTRAVENOUS | Status: AC
  Filled 2020-09-15: qty 1

## 2020-09-15 MED ORDER — SUCCINYLCHOLINE CHLORIDE 200 MG/10ML IV SOSY
PREFILLED_SYRINGE | INTRAVENOUS | Status: AC
  Filled 2020-09-15: qty 10

## 2020-09-15 MED ORDER — ENOXAPARIN SODIUM 40 MG/0.4ML ~~LOC~~ SOLN: 40.0000 mg | SUBCUTANEOUS | Status: DC

## 2020-09-15 MED ORDER — FENTANYL CITRATE (PF) 100 MCG/2ML IJ SOLN
50.0000 ug | INTRAMUSCULAR | Status: DC | PRN
  Administered 2020-09-15 (×2): 100 ug via INTRAVENOUS
  Filled 2020-09-15: qty 2

## 2020-09-15 MED ORDER — ORAL CARE MOUTH RINSE
15.0000 mL | OROMUCOSAL | Status: DC
  Administered 2020-09-16 (×5): 15 mL via OROMUCOSAL

## 2020-09-15 MED ORDER — ROCURONIUM BROMIDE 10 MG/ML (PF) SYRINGE
PREFILLED_SYRINGE | INTRAVENOUS | Status: DC | PRN
  Administered 2020-09-15 (×4): 50 mg via INTRAVENOUS

## 2020-09-15 MED ORDER — DEXAMETHASONE SODIUM PHOSPHATE 10 MG/ML IJ SOLN
INTRAMUSCULAR | Status: DC | PRN
  Administered 2020-09-15: 10 mg via INTRAVENOUS

## 2020-09-15 MED ORDER — SODIUM CHLORIDE 0.9% FLUSH: 3.0000 mL | INTRAVENOUS | Status: DC | PRN

## 2020-09-15 MED ORDER — VANCOMYCIN HCL IN DEXTROSE 1-5 GM/200ML-% IV SOLN
1000.0000 mg | INTRAVENOUS | Status: AC
  Administered 2020-09-15: 1000 mg via INTRAVENOUS

## 2020-09-15 MED ORDER — CHLORHEXIDINE GLUCONATE CLOTH 2 % EX PADS
6.0000 | MEDICATED_PAD | Freq: Every day | CUTANEOUS | Status: DC
  Administered 2020-09-16 – 2020-09-18 (×4): 6 via TOPICAL

## 2020-09-15 MED ORDER — AMISULPRIDE (ANTIEMETIC) 5 MG/2ML IV SOLN: 10.0000 mg | Freq: Once | INTRAVENOUS | Status: AC | PRN

## 2020-09-15 MED ORDER — SODIUM CHLORIDE 0.9 % IV SOLN: INTRAVENOUS | Status: AC

## 2020-09-15 MED ORDER — PROMETHAZINE HCL 25 MG/ML IJ SOLN
6.2500 mg | INTRAMUSCULAR | Status: AC | PRN
Start: 2020-09-15 — End: 2020-09-15
  Administered 2020-09-15: 6.25 mg via INTRAVENOUS

## 2020-09-15 MED ORDER — SODIUM CHLORIDE 0.9 % IV SOLN
INTRAVENOUS | Status: AC | PRN
  Administered 2020-09-15: 4 ug/kg/min via INTRAVENOUS

## 2020-09-15 MED ORDER — MORPHINE SULFATE (PF) 4 MG/ML IV SOLN
INTRAVENOUS | Status: AC
  Filled 2020-09-15: qty 1

## 2020-09-15 MED ORDER — PROPOFOL 1000 MG/100ML IV EMUL
0.0000 ug/kg/min | INTRAVENOUS | Status: DC
  Administered 2020-09-15: 10 ug/kg/min via INTRAVENOUS
  Filled 2020-09-15: qty 100

## 2020-09-15 MED ORDER — ONDANSETRON HCL 4 MG/2ML IJ SOLN: 4.0000 mg | Freq: Four times a day (QID) | INTRAMUSCULAR | Status: DC | PRN

## 2020-09-15 MED ORDER — ACETAMINOPHEN 10 MG/ML IV SOLN
INTRAVENOUS | Status: AC
  Filled 2020-09-15: qty 100

## 2020-09-15 MED ORDER — ALBUMIN HUMAN 5 % IV SOLN: INTRAVENOUS | Status: DC | PRN

## 2020-09-15 MED ORDER — SODIUM CHLORIDE 0.9 % IV SOLN
0.7500 ug/kg/min | INTRAVENOUS | Status: DC
  Administered 2020-09-15: 4 ug/kg/min via INTRAVENOUS
  Administered 2020-09-16: 0.75 ug/kg/min via INTRAVENOUS
  Filled 2020-09-15 (×2): qty 50

## 2020-09-15 MED ORDER — VERAPAMIL HCL 2.5 MG/ML IV SOLN
INTRAVENOUS | Status: AC
  Filled 2020-09-15: qty 2

## 2020-09-15 MED ORDER — MAGNESIUM SULFATE IN D5W 1-5 GM/100ML-% IV SOLN
1.0000 g | Freq: Once | INTRAVENOUS | Status: AC
  Administered 2020-09-16: 1 g via INTRAVENOUS
  Filled 2020-09-15: qty 100

## 2020-09-15 MED ORDER — SODIUM CHLORIDE 0.9 % IV SOLN
INTRAVENOUS | Status: AC | PRN
  Administered 2020-09-15 (×2): 10 mL/h via INTRAVENOUS

## 2020-09-15 MED ORDER — MIDAZOLAM HCL 2 MG/2ML IJ SOLN
INTRAMUSCULAR | Status: AC
  Administered 2020-09-15: 2 mg
  Filled 2020-09-15: qty 2

## 2020-09-15 MED ORDER — HYDROMORPHONE HCL 1 MG/ML IJ SOLN
INTRAMUSCULAR | Status: AC
  Filled 2020-09-15: qty 1

## 2020-09-15 MED ORDER — VERAPAMIL HCL 2.5 MG/ML IV SOLN
INTRAVENOUS | Status: DC | PRN
  Administered 2020-09-15: 10 mL via INTRA_ARTERIAL

## 2020-09-15 MED ORDER — FUROSEMIDE 10 MG/ML IJ SOLN: 80.0000 mg | Freq: Two times a day (BID) | INTRAMUSCULAR | Status: DC

## 2020-09-15 MED ORDER — EPHEDRINE 5 MG/ML INJ
INTRAVENOUS | Status: AC
  Filled 2020-09-15: qty 10

## 2020-09-15 MED ORDER — PHENYLEPHRINE 40 MCG/ML (10ML) SYRINGE FOR IV PUSH (FOR BLOOD PRESSURE SUPPORT)
PREFILLED_SYRINGE | INTRAVENOUS | Status: AC
  Filled 2020-09-15: qty 10

## 2020-09-15 MED ORDER — BISACODYL 10 MG RE SUPP: 10.0000 mg | Freq: Every day | RECTAL | Status: DC | PRN

## 2020-09-15 MED ORDER — BUPIVACAINE HCL (PF) 0.5 % IJ SOLN
INTRAMUSCULAR | Status: AC
  Filled 2020-09-15: qty 30

## 2020-09-15 MED ORDER — ASPIRIN 325 MG PO TABS: 325.0000 mg | ORAL_TABLET | Freq: Every day | ORAL | Status: DC

## 2020-09-15 MED ORDER — ETOMIDATE 2 MG/ML IV SOLN
INTRAVENOUS | Status: AC
  Administered 2020-09-15: 20 mg
  Filled 2020-09-15: qty 20

## 2020-09-15 MED ORDER — HEPARIN SODIUM (PORCINE) 1000 UNIT/ML IJ SOLN
INTRAMUSCULAR | Status: DC | PRN
  Administered 2020-09-15: 2000 [IU] via INTRAVENOUS
  Administered 2020-09-15: 3000 [IU] via INTRAVENOUS
  Administered 2020-09-15: 4000 [IU] via INTRAVENOUS

## 2020-09-15 MED ORDER — ACETAMINOPHEN 10 MG/ML IV SOLN
1000.0000 mg | Freq: Four times a day (QID) | INTRAVENOUS | Status: DC
  Administered 2020-09-15: 1000 mg via INTRAVENOUS

## 2020-09-15 MED ORDER — HEPARIN SODIUM (PORCINE) 1000 UNIT/ML IJ SOLN
INTRAMUSCULAR | Status: AC
  Filled 2020-09-15: qty 1

## 2020-09-15 MED ORDER — CANGRELOR TETRASODIUM 50 MG IV SOLR
INTRAVENOUS | Status: AC
  Filled 2020-09-15: qty 50

## 2020-09-15 MED ORDER — FUROSEMIDE 10 MG/ML IJ SOLN
INTRAMUSCULAR | Status: AC
  Filled 2020-09-15: qty 4

## 2020-09-15 MED ORDER — FENTANYL CITRATE (PF) 100 MCG/2ML IJ SOLN
50.0000 ug | Freq: Once | INTRAMUSCULAR | Status: AC
Start: 2020-09-15 — End: 2020-09-15
  Administered 2020-09-15: 50 ug via INTRAVENOUS

## 2020-09-15 MED ORDER — KETOROLAC TROMETHAMINE 15 MG/ML IJ SOLN: 15.0000 mg | Freq: Four times a day (QID) | INTRAMUSCULAR | Status: DC

## 2020-09-15 MED ORDER — ACETAMINOPHEN 325 MG PO TABS: 650.0000 mg | ORAL_TABLET | ORAL | Status: DC | PRN

## 2020-09-15 MED ORDER — ATORVASTATIN CALCIUM 80 MG PO TABS
80.0000 mg | ORAL_TABLET | Freq: Every day | ORAL | Status: DC
  Administered 2020-09-16 – 2020-09-19 (×4): 80 mg via ORAL
  Filled 2020-09-15 (×4): qty 1

## 2020-09-15 MED ORDER — NITROGLYCERIN IN D5W 200-5 MCG/ML-% IV SOLN
0.0000 ug/min | INTRAVENOUS | Status: DC
  Administered 2020-09-16: 5 ug/min via INTRAVENOUS
  Filled 2020-09-15: qty 250

## 2020-09-15 MED ORDER — MORPHINE SULFATE (PF) 2 MG/ML IV SOLN: 2.0000 mg | INTRAVENOUS | Status: DC | PRN

## 2020-09-15 MED ORDER — FUROSEMIDE 10 MG/ML IJ SOLN
INTRAMUSCULAR | Status: DC | PRN
  Administered 2020-09-15 (×2): 40 mg via INTRAVENOUS

## 2020-09-15 MED ORDER — MORPHINE SULFATE (PF) 2 MG/ML IV SOLN
INTRAVENOUS | Status: DC | PRN
  Administered 2020-09-15: 2 mg via INTRAVENOUS

## 2020-09-15 MED ORDER — PROPOFOL 10 MG/ML IV BOLUS
INTRAVENOUS | Status: DC | PRN
  Administered 2020-09-15: 50 mg via INTRAVENOUS
  Administered 2020-09-15: 150 mg via INTRAVENOUS

## 2020-09-15 MED ORDER — PHENYLEPHRINE 40 MCG/ML (10ML) SYRINGE FOR IV PUSH (FOR BLOOD PRESSURE SUPPORT)
PREFILLED_SYRINGE | INTRAVENOUS | Status: DC | PRN
  Administered 2020-09-15 (×2): 80 ug via INTRAVENOUS

## 2020-09-15 MED ORDER — CANGRELOR BOLUS VIA INFUSION
INTRAVENOUS | Status: DC | PRN
  Administered 2020-09-15: 2517 ug via INTRAVENOUS

## 2020-09-15 MED ORDER — HEPARIN (PORCINE) IN NACL 1000-0.9 UT/500ML-% IV SOLN
INTRAVENOUS | Status: AC
  Filled 2020-09-15: qty 1000

## 2020-09-15 MED ORDER — MIDAZOLAM HCL 5 MG/5ML IJ SOLN
INTRAMUSCULAR | Status: DC | PRN
  Administered 2020-09-15: 2 mg via INTRAVENOUS

## 2020-09-15 MED ORDER — BUPIVACAINE HCL 0.5 % IJ SOLN
INTRAMUSCULAR | Status: AC
  Filled 2020-09-15: qty 1

## 2020-09-15 MED ORDER — LACTATED RINGERS IV SOLN: INTRAVENOUS | Status: DC

## 2020-09-15 MED ORDER — AMISULPRIDE (ANTIEMETIC) 5 MG/2ML IV SOLN
INTRAVENOUS | Status: AC
  Administered 2020-09-15: 10 mg via INTRAVENOUS
  Filled 2020-09-15: qty 4

## 2020-09-15 MED ORDER — FENTANYL 2500MCG IN NS 250ML (10MCG/ML) PREMIX INFUSION
50.0000 ug/h | INTRAVENOUS | Status: DC
  Administered 2020-09-15: 50 ug/h via INTRAVENOUS
  Filled 2020-09-15: qty 250

## 2020-09-15 MED ORDER — SODIUM CHLORIDE 0.9% FLUSH
3.0000 mL | Freq: Two times a day (BID) | INTRAVENOUS | Status: DC
  Administered 2020-09-16 – 2020-09-19 (×6): 3 mL via INTRAVENOUS

## 2020-09-15 MED ORDER — FENTANYL CITRATE (PF) 100 MCG/2ML IJ SOLN
INTRAMUSCULAR | Status: AC
  Administered 2020-09-15: 25 ug via INTRAVENOUS
  Filled 2020-09-15: qty 2

## 2020-09-15 MED ORDER — SODIUM CHLORIDE 0.9 % IV SOLN: 10.0000 mL/h | Freq: Once | INTRAVENOUS | Status: DC

## 2020-09-15 MED ORDER — ROCURONIUM BROMIDE 10 MG/ML (PF) SYRINGE
PREFILLED_SYRINGE | INTRAVENOUS | Status: AC
  Administered 2020-09-15: 50 mg
  Filled 2020-09-15: qty 10

## 2020-09-15 MED ORDER — PROMETHAZINE HCL 25 MG/ML IJ SOLN
INTRAMUSCULAR | Status: AC
  Administered 2020-09-15: 6.25 mg via INTRAVENOUS
  Filled 2020-09-15: qty 1

## 2020-09-15 MED ORDER — FUROSEMIDE 10 MG/ML IJ SOLN
80.0000 mg | Freq: Once | INTRAMUSCULAR | Status: AC
  Administered 2020-09-15: 80 mg via INTRAVENOUS
  Filled 2020-09-15: qty 8

## 2020-09-15 MED ORDER — AMIODARONE HCL IN DEXTROSE 360-4.14 MG/200ML-% IV SOLN: 60.0000 mg/h | INTRAVENOUS | Status: AC

## 2020-09-15 MED ORDER — HEPARIN (PORCINE) IN NACL 1000-0.9 UT/500ML-% IV SOLN
INTRAVENOUS | Status: DC | PRN
  Administered 2020-09-15 (×3): 500 mL

## 2020-09-15 MED ORDER — ARTIFICIAL TEARS OPHTHALMIC OINT
TOPICAL_OINTMENT | OPHTHALMIC | Status: AC
  Filled 2020-09-15: qty 3.5

## 2020-09-15 MED ORDER — NOREPINEPHRINE 4 MG/250ML-% IV SOLN
2.0000 ug/min | INTRAVENOUS | Status: DC
  Administered 2020-09-16: 2 ug/min via INTRAVENOUS
  Filled 2020-09-15: qty 250

## 2020-09-15 MED ORDER — NITROGLYCERIN IN D5W 200-5 MCG/ML-% IV SOLN
INTRAVENOUS | Status: AC | PRN
  Administered 2020-09-15: 10 ug/min via INTRAVENOUS

## 2020-09-15 MED ORDER — AMIODARONE LOAD VIA INFUSION
150.0000 mg | Freq: Once | INTRAVENOUS | Status: DC
  Filled 2020-09-15: qty 83.34

## 2020-09-15 MED ORDER — BUPIVACAINE LIPOSOME 1.3 % IJ SUSP
INTRAMUSCULAR | Status: DC | PRN
  Administered 2020-09-15: 20 mL

## 2020-09-15 MED ORDER — INSULIN ASPART 100 UNIT/ML ~~LOC~~ SOLN
0.0000 [IU] | SUBCUTANEOUS | Status: DC
  Administered 2020-09-15: 3 [IU] via SUBCUTANEOUS
  Administered 2020-09-16 (×4): 1 [IU] via SUBCUTANEOUS
  Administered 2020-09-16: 2 [IU] via SUBCUTANEOUS
  Administered 2020-09-16 – 2020-09-18 (×4): 1 [IU] via SUBCUTANEOUS

## 2020-09-15 MED ORDER — PROPOFOL 10 MG/ML IV BOLUS
INTRAVENOUS | Status: AC
  Filled 2020-09-15: qty 40

## 2020-09-15 MED ORDER — HEPARIN (PORCINE) IN NACL 1000-0.9 UT/500ML-% IV SOLN
INTRAVENOUS | Status: AC
  Filled 2020-09-15: qty 500

## 2020-09-15 MED ORDER — ACETAMINOPHEN 500 MG PO TABS
ORAL_TABLET | ORAL | Status: AC
  Administered 2020-09-15: 1000 mg via ORAL
  Filled 2020-09-15: qty 2

## 2020-09-15 MED ORDER — ROCURONIUM BROMIDE 10 MG/ML (PF) SYRINGE
PREFILLED_SYRINGE | INTRAVENOUS | Status: AC
  Filled 2020-09-15: qty 10

## 2020-09-15 MED ORDER — ACETAMINOPHEN 500 MG PO TABS: 1000.0000 mg | ORAL_TABLET | Freq: Once | ORAL | Status: AC

## 2020-09-15 MED ORDER — HYDROMORPHONE HCL 1 MG/ML IJ SOLN
0.2500 mg | INTRAMUSCULAR | Status: DC | PRN
  Administered 2020-09-15: 0.25 mg via INTRAVENOUS

## 2020-09-15 MED ORDER — FENTANYL BOLUS VIA INFUSION
50.0000 ug | INTRAVENOUS | Status: DC | PRN
  Administered 2020-09-16 (×2): 50 ug via INTRAVENOUS
  Filled 2020-09-15: qty 50

## 2020-09-15 MED ORDER — VANCOMYCIN HCL IN DEXTROSE 1-5 GM/200ML-% IV SOLN
INTRAVENOUS | Status: AC
  Filled 2020-09-15: qty 200

## 2020-09-15 MED ORDER — FENTANYL CITRATE (PF) 250 MCG/5ML IJ SOLN
INTRAMUSCULAR | Status: DC | PRN
  Administered 2020-09-15 (×3): 50 ug via INTRAVENOUS
  Administered 2020-09-15: 100 ug via INTRAVENOUS

## 2020-09-15 MED ORDER — CHLORHEXIDINE GLUCONATE 0.12 % MT SOLN
OROMUCOSAL | Status: AC
  Administered 2020-09-15: 15 mL via OROMUCOSAL
  Filled 2020-09-15: qty 15

## 2020-09-15 MED ORDER — LABETALOL HCL 5 MG/ML IV SOLN: 10.0000 mg | INTRAVENOUS | Status: DC | PRN

## 2020-09-15 MED ORDER — IOHEXOL 350 MG/ML SOLN
INTRAVENOUS | Status: DC | PRN
  Administered 2020-09-15: 200 mL

## 2020-09-15 MED ORDER — ORAL CARE MOUTH RINSE: 15.0000 mL | Freq: Once | OROMUCOSAL | Status: AC

## 2020-09-15 MED ORDER — BUPIVACAINE HCL 0.5 % IJ SOLN
INTRAMUSCULAR | Status: DC | PRN
  Administered 2020-09-15: 30 mL

## 2020-09-15 MED ORDER — BUPIVACAINE LIPOSOME 1.3 % IJ SUSP
INTRAMUSCULAR | Status: AC
  Filled 2020-09-15: qty 20

## 2020-09-15 MED ORDER — DOCUSATE SODIUM 50 MG/5ML PO LIQD: 100.0000 mg | Freq: Two times a day (BID) | ORAL | Status: DC

## 2020-09-15 MED ORDER — SODIUM CHLORIDE 0.9 % IV SOLN
250.0000 mL | INTRAVENOUS | Status: DC | PRN
Start: 2020-09-15 — End: 2020-09-19

## 2020-09-15 MED ORDER — LIDOCAINE 2% (20 MG/ML) 5 ML SYRINGE
INTRAMUSCULAR | Status: AC
  Filled 2020-09-15: qty 5

## 2020-09-15 MED ORDER — POLYETHYLENE GLYCOL 3350 17 G PO PACK: 17.0000 g | PACK | Freq: Every day | ORAL | Status: DC

## 2020-09-15 MED ORDER — MIDAZOLAM HCL 2 MG/2ML IJ SOLN
2.0000 mg | INTRAMUSCULAR | Status: DC | PRN
  Administered 2020-09-15 – 2020-09-16 (×5): 2 mg via INTRAVENOUS
  Filled 2020-09-15 (×4): qty 2

## 2020-09-15 MED ORDER — AMIODARONE HCL IN DEXTROSE 360-4.14 MG/200ML-% IV SOLN
30.0000 mg/h | INTRAVENOUS | Status: DC
  Administered 2020-09-16 – 2020-09-18 (×6): 30 mg/h via INTRAVENOUS
  Filled 2020-09-15 (×6): qty 200

## 2020-09-15 MED ORDER — FENTANYL CITRATE (PF) 100 MCG/2ML IJ SOLN
25.0000 ug | INTRAMUSCULAR | Status: DC | PRN
Start: 2020-09-15 — End: 2020-09-15

## 2020-09-15 MED ORDER — ONDANSETRON HCL 4 MG/2ML IJ SOLN
INTRAMUSCULAR | Status: DC | PRN
  Administered 2020-09-15 (×2): 4 mg via INTRAVENOUS

## 2020-09-15 MED ORDER — FENTANYL CITRATE (PF) 100 MCG/2ML IJ SOLN: 50.0000 ug | INTRAMUSCULAR | Status: DC | PRN

## 2020-09-15 MED ORDER — PHENYLEPHRINE HCL-NACL 10-0.9 MG/250ML-% IV SOLN
INTRAVENOUS | Status: DC | PRN
  Administered 2020-09-15: 20 ug/min via INTRAVENOUS

## 2020-09-15 MED ORDER — LIDOCAINE HCL (PF) 1 % IJ SOLN
INTRAMUSCULAR | Status: AC
  Filled 2020-09-15: qty 30

## 2020-09-15 MED ORDER — LACTATED RINGERS IV SOLN: INTRAVENOUS | Status: DC | PRN

## 2020-09-15 MED ORDER — DEXAMETHASONE SODIUM PHOSPHATE 10 MG/ML IJ SOLN
INTRAMUSCULAR | Status: AC
  Filled 2020-09-15: qty 1

## 2020-09-15 MED ORDER — AMIODARONE LOAD VIA INFUSION
150.0000 mg | Freq: Once | INTRAVENOUS | Status: AC
  Administered 2020-09-15: 150 mg via INTRAVENOUS
  Filled 2020-09-15: qty 83.34

## 2020-09-15 MED ORDER — HYDRALAZINE HCL 20 MG/ML IJ SOLN: 10.0000 mg | INTRAMUSCULAR | Status: AC | PRN

## 2020-09-15 MED ORDER — POTASSIUM CHLORIDE 10 MEQ/50ML IV SOLN
10.0000 meq | INTRAVENOUS | Status: AC
  Administered 2020-09-15 (×3): 10 meq via INTRAVENOUS
  Filled 2020-09-15 (×3): qty 50

## 2020-09-15 MED ORDER — ONDANSETRON HCL 4 MG/2ML IJ SOLN
4.0000 mg | Freq: Four times a day (QID) | INTRAMUSCULAR | Status: DC
  Administered 2020-09-15 – 2020-09-19 (×14): 4 mg via INTRAVENOUS
  Filled 2020-09-15 (×14): qty 2

## 2020-09-15 MED ORDER — LIDOCAINE HCL (PF) 1 % IJ SOLN
INTRAMUSCULAR | Status: DC | PRN
  Administered 2020-09-15: 2 mL

## 2020-09-15 MED ORDER — PANTOPRAZOLE SODIUM 40 MG IV SOLR
40.0000 mg | Freq: Every day | INTRAVENOUS | Status: DC
  Administered 2020-09-15 – 2020-09-19 (×5): 40 mg via INTRAVENOUS
  Filled 2020-09-15 (×5): qty 40

## 2020-09-15 MED ORDER — ASPIRIN 325 MG PO TABS
325.0000 mg | ORAL_TABLET | Freq: Once | ORAL | Status: AC
  Administered 2020-09-15: 325 mg via ORAL
  Filled 2020-09-15: qty 1

## 2020-09-15 SURGICAL SUPPLY — 101 items
ADH SKN CLS APL DERMABOND .7 (GAUZE/BANDAGES/DRESSINGS) ×3
BLADE CLIPPER SURG (BLADE) ×5 IMPLANT
BLADE SURG 11 STRL SS (BLADE) ×5 IMPLANT
BUTTON OLYMPUS DEFENDO 5 PIECE (MISCELLANEOUS) ×5 IMPLANT
CANISTER SUCT 3000ML PPV (MISCELLANEOUS) ×10 IMPLANT
CANNULA REDUC XI 12-8 STAPL (CANNULA)
CANNULA REDUC XI 12-8MM STAPL (CANNULA)
CANNULA REDUCER 12-8 DVNC XI (CANNULA) IMPLANT
CATH THORACIC 28FR (CATHETERS) ×5 IMPLANT
CNTNR URN SCR LID CUP LEK RST (MISCELLANEOUS) ×4 IMPLANT
CONT SPEC 4OZ STRL OR WHT (MISCELLANEOUS) ×10
COVER BACK TABLE 60X90IN (DRAPES) ×5 IMPLANT
DECANTER SPIKE VIAL GLASS SM (MISCELLANEOUS) ×5 IMPLANT
DEFOGGER SCOPE WARMER CLEARIFY (MISCELLANEOUS) ×5 IMPLANT
DERMABOND ADVANCED (GAUZE/BANDAGES/DRESSINGS) ×2
DERMABOND ADVANCED .7 DNX12 (GAUZE/BANDAGES/DRESSINGS) ×5 IMPLANT
DEVICE SUTURE ENDOST 10MM (ENDOMECHANICALS) IMPLANT
DRAIN PENROSE 1/2X12 LTX STRL (WOUND CARE) ×2 IMPLANT
DRAIN PENROSE 1/4X12 LTX STRL (WOUND CARE) ×5 IMPLANT
DRAPE COLUMN DVNC XI (DISPOSABLE) ×12 IMPLANT
DRAPE CV SPLIT W-CLR ANES SCRN (DRAPES) ×5 IMPLANT
DRAPE DA VINCI XI COLUMN (DISPOSABLE) ×20
DRAPE INCISE IOBAN 66X45 STRL (DRAPES) ×4 IMPLANT
DRAPE ORTHO SPLIT 77X108 STRL (DRAPES) ×5
DRAPE SLUSH/WARMER DISC (DRAPES) ×5 IMPLANT
DRAPE SURG ORHT 6 SPLT 77X108 (DRAPES) ×3 IMPLANT
ELECT REM PT RETURN 9FT ADLT (ELECTROSURGICAL) ×5
ELECTRODE REM PT RTRN 9FT ADLT (ELECTROSURGICAL) ×3 IMPLANT
FELT TEFLON 1X6 (MISCELLANEOUS) ×3 IMPLANT
FORCEPS GRASP COMBO 8X230 (FORCEP) ×5 IMPLANT
GAUZE KITTNER 4X5 RF (MISCELLANEOUS) ×2 IMPLANT
GAUZE SPONGE 4X4 12PLY STRL (GAUZE/BANDAGES/DRESSINGS) ×5 IMPLANT
GLOVE BIO SURGEON STRL SZ7 (GLOVE) ×5 IMPLANT
GLOVE BIO SURGEON STRL SZ7.5 (GLOVE) ×5 IMPLANT
GOWN STRL REUS W/ TWL LRG LVL3 (GOWN DISPOSABLE) ×3 IMPLANT
GOWN STRL REUS W/ TWL XL LVL3 (GOWN DISPOSABLE) ×6 IMPLANT
GOWN STRL REUS W/TWL 2XL LVL3 (GOWN DISPOSABLE) ×5 IMPLANT
GOWN STRL REUS W/TWL LRG LVL3 (GOWN DISPOSABLE) ×5
GOWN STRL REUS W/TWL XL LVL3 (GOWN DISPOSABLE) ×10
GRASPER SUT TROCAR 14GX15 (MISCELLANEOUS) IMPLANT
IRRIGATION STRYKERFLOW (MISCELLANEOUS) ×3 IMPLANT
IRRIGATOR STRYKERFLOW (MISCELLANEOUS) ×5
IRRIGATOR SUCT 8 DISP DVNC XI (IRRIGATION / IRRIGATOR) IMPLANT
IRRIGATOR SUCTION 8MM XI DISP (IRRIGATION / IRRIGATOR) ×5
IV NS 1000ML (IV SOLUTION)
IV NS 1000ML BAXH (IV SOLUTION) IMPLANT
KIT BASIN OR (CUSTOM PROCEDURE TRAY) ×5 IMPLANT
KIT TURNOVER KIT B (KITS) ×5 IMPLANT
MARKER SKIN DUAL TIP RULER LAB (MISCELLANEOUS) ×5 IMPLANT
MATRIX GENTRIX 7.5X6 HIATAL (Tissue) ×3 IMPLANT
NDL 18GX1X1/2 (RX/OR ONLY) (NEEDLE) IMPLANT
NDL SCLEROTHERAPY 23X2X240 (NEEDLE) IMPLANT
NEEDLE 18GX1X1/2 (RX/OR ONLY) (NEEDLE) IMPLANT
NEEDLE SCLEROTHERAPY 23X2X240 (NEEDLE) IMPLANT
NS IRRIG 1000ML POUR BTL (IV SOLUTION) ×10 IMPLANT
OBTURATOR OPTICAL STANDARD 8MM (TROCAR) ×5
OBTURATOR OPTICAL STND 8 DVNC (TROCAR) ×3
OBTURATOR OPTICALSTD 8 DVNC (TROCAR) ×3 IMPLANT
OIL SILICONE PENTAX (PARTS (SERVICE/REPAIRS)) IMPLANT
PACK CHEST (CUSTOM PROCEDURE TRAY) ×5 IMPLANT
PACK UNIVERSAL I (CUSTOM PROCEDURE TRAY) ×5 IMPLANT
PAD ARMBOARD 7.5X6 YLW CONV (MISCELLANEOUS) ×10 IMPLANT
SEAL CANN UNIV 5-8 DVNC XI (MISCELLANEOUS) ×12 IMPLANT
SEAL XI 5MM-8MM UNIVERSAL (MISCELLANEOUS) ×20
SEALER SYNCHRO 8 IS4000 DV (MISCELLANEOUS) ×5
SEALER SYNCHRO 8 IS4000 DVNC (MISCELLANEOUS) ×2 IMPLANT
SET IRRIG TUBING LAPAROSCOPIC (IRRIGATION / IRRIGATOR) IMPLANT
SET TUBE SMOKE EVAC HIGH FLOW (TUBING) ×5 IMPLANT
SHEET MEDIUM DRAPE 40X70 STRL (DRAPES) ×5 IMPLANT
SOL ANTI FOG 6CC (MISCELLANEOUS) ×1 IMPLANT
SOLUTION ANTI FOG 6CC (MISCELLANEOUS) ×2
STAPLER CANNULA SEAL DVNC XI (STAPLE) IMPLANT
STAPLER CANNULA SEAL XI (STAPLE)
STOPCOCK 4 WAY LG BORE MALE ST (IV SETS) ×5 IMPLANT
SUT ETHIBOND 0 36 GRN (SUTURE) ×13 IMPLANT
SUT SILK  1 MH (SUTURE) ×10
SUT SILK 1 MH (SUTURE) ×4 IMPLANT
SUT SURGIDAC NAB ES-9 0 48 120 (SUTURE) IMPLANT
SUT VIC AB 2-0 CT1 27 (SUTURE) ×10
SUT VIC AB 2-0 CT1 TAPERPNT 27 (SUTURE) ×2 IMPLANT
SUT VIC AB 3-0 SH 27 (SUTURE) ×15
SUT VIC AB 3-0 SH 27X BRD (SUTURE) IMPLANT
SUT VIC AB 3-0 X1 27 (SUTURE) ×6 IMPLANT
SUT VICRYL 0 UR6 27IN ABS (SUTURE) ×10 IMPLANT
SYR 10ML LL (SYRINGE) ×5 IMPLANT
SYR 20ML ECCENTRIC (SYRINGE) ×5 IMPLANT
SYR 30ML SLIP (SYRINGE) ×5 IMPLANT
SYR 50ML LL SCALE MARK (SYRINGE) ×5 IMPLANT
SYSTEM SAHARA CHEST DRAIN ATS (WOUND CARE) ×3 IMPLANT
TOWEL GREEN STERILE (TOWEL DISPOSABLE) ×5 IMPLANT
TOWEL GREEN STERILE FF (TOWEL DISPOSABLE) ×5 IMPLANT
TRAY FOLEY MTR SLVR 16FR STAT (SET/KITS/TRAYS/PACK) ×5 IMPLANT
TROCAR XCEL 12X100 BLDLESS (ENDOMECHANICALS) ×5 IMPLANT
TROCAR XCEL BLADELESS 5X75MML (TROCAR) ×5 IMPLANT
TROCAR XCEL NON-BLD 5MMX100MML (ENDOMECHANICALS) IMPLANT
TUBE CONNECTING 20'X1/4 (TUBING) ×1
TUBE CONNECTING 20X1/4 (TUBING) ×4 IMPLANT
TUBING ENDO SMARTCAP (MISCELLANEOUS) ×5 IMPLANT
TUBING EXTENTION W/L.L. (IV SETS) ×5 IMPLANT
UNDERPAD 30X36 HEAVY ABSORB (UNDERPADS AND DIAPERS) ×5 IMPLANT
WATER STERILE IRR 1000ML POUR (IV SOLUTION) ×10 IMPLANT

## 2020-09-15 SURGICAL SUPPLY — 25 items
BALLN SAPPHIRE 2.5X15 (BALLOONS) ×2
BALLN SAPPHIRE 2.75X15 (BALLOONS) ×2
BALLN SAPPHIRE ~~LOC~~ 3.5X10 (BALLOONS) ×1 IMPLANT
BALLN SAPPHIRE ~~LOC~~ 4.0X10 (BALLOONS) ×2 IMPLANT
BALLOON SAPPHIRE 2.5X15 (BALLOONS) IMPLANT
BALLOON SAPPHIRE 2.75X15 (BALLOONS) IMPLANT
CATH 5FR JL3.5 JR4 ANG PIG MP (CATHETERS) ×1 IMPLANT
CATH EXTRAC PRONTO 5.5F 138CM (CATHETERS) ×1 IMPLANT
CATH VISTA GUIDE 6FR XB3.5 (CATHETERS) ×1 IMPLANT
DEVICE RAD COMP TR BAND LRG (VASCULAR PRODUCTS) ×1 IMPLANT
GLIDESHEATH SLEND SS 6F .021 (SHEATH) ×1 IMPLANT
GUIDEWIRE INQWIRE 1.5J.035X260 (WIRE) ×1 IMPLANT
INQWIRE 1.5J .035X260CM (WIRE) ×2
KIT ENCORE 26 ADVANTAGE (KITS) ×1 IMPLANT
KIT HEART LEFT (KITS) ×2 IMPLANT
PACK CARDIAC CATHETERIZATION (CUSTOM PROCEDURE TRAY) ×2 IMPLANT
SHEATH PROBE COVER 6X72 (BAG) ×1 IMPLANT
STENT RESOLUTE ONYX 3.0X8 (Permanent Stent) ×1 IMPLANT
STENT RESOLUTE ONYX 3.5X30 (Permanent Stent) ×1 IMPLANT
TRANSDUCER W/STOPCOCK (MISCELLANEOUS) ×2 IMPLANT
TUBING CIL FLEX 10 FLL-RA (TUBING) ×2 IMPLANT
WIRE ASAHI PROWATER 180CM (WIRE) ×1 IMPLANT
WIRE HI TORQ BMW 190CM (WIRE) ×1 IMPLANT
WIRE HI TORQ VERSACORE-J 145CM (WIRE) ×1 IMPLANT
WIRE MICROINTRODUCER 60CM (WIRE) ×1 IMPLANT

## 2020-09-15 NOTE — Procedures (Signed)
Intubation Procedure Note  Jerry Kramer  [id number]  [birth year]  Date:09/15/20  Time:6:30 PM   Provider Performing:Evetta Renner L Elridge Stemm   Procedure: Intubation (31500)  Indication(s) Respiratory Failure  Consent Risks of the procedure as well as the alternatives and risks of each were explained to the patient and/or caregiver.  Consent for the procedure was obtained and is signed in the bedside chart  Anesthesia Etomidate, Versed, Fentanyl and Rocuronium  Time Out Verified patient identification, verified procedure, site/side was marked, verified correct patient position, special equipment/implants available, medications/allergies/relevant history reviewed, required imaging and test results available.  Sterile Technique Usual hand hygeine, masks, and gloves were used  Procedure Description Patient positioned in bed supine.  Sedation given as noted above.  Patient was intubated with endotracheal tube using Glidescope.  View was Grade 1 full glottis .  Number of attempts was 1.  Colorimetric CO2 detector was consistent with tracheal placement.  Complications/Tolerance None; patient tolerated the procedure well. Chest X-ray is ordered to verify placement.  EBL n/a  Specimen(s) None  Garner Nash, DO [facility] 09/15/2020 6:31 PM

## 2020-09-15 NOTE — Progress Notes (Signed)
eLink Physician-Brief Progress Note Patient Name: Jerry Kramer DOB: December 13, 1955 MRN: [id number]   Date of Service  09/15/2020  HPI/Events of Note  Notified of vent desynchrony on PRVC 18/530/50%/5 PEEP. On propofol drip, given Fentanyl 200 mcg and Versed 2 mg  ABG 7.29/39/266  Switched to pressure AC with improvement but still with occasional desynchrony. Appears to not get flow/volume with the mandatory breaths.   eICU Interventions  Bedside CCM team made aware Will start Fentanyl drip for now and reassess     Intervention Category Major Interventions: Respiratory failure - evaluation and management  Judd Lien 09/15/2020, 9:24 PM

## 2020-09-15 NOTE — Anesthesia Procedure Notes (Signed)
Procedure Name: Intubation Date/Time: 09/15/2020 7:46 AM Performed by: Renato Shin, CRNA Pre-anesthesia Checklist: Patient identified, Emergency Drugs available, Suction available and Patient being monitored Patient Re-evaluated:Patient Re-evaluated prior to induction Oxygen Delivery Method: Circle system utilized Preoxygenation: Pre-oxygenation with 100% oxygen Induction Type: IV induction Ventilation: Mask ventilation without difficulty Laryngoscope Size: Glidescope and 4 Grade View: Grade I Tube type: Oral Tube size: 7.5 mm Number of attempts: 1 Airway Equipment and Method: Oral airway,  Video-laryngoscopy and Rigid stylet Placement Confirmation: ETT inserted through vocal cords under direct vision,  positive ETCO2 and breath sounds checked- equal and bilateral Secured at: 22 cm Tube secured with: Tape Dental Injury: Teeth and Oropharynx as per pre-operative assessment

## 2020-09-15 NOTE — Interval H&P Note (Signed)
History and Physical Interval Note:  09/15/2020 7:27 AM  Jerry Kramer  has presented today for surgery, with the diagnosis of HIATAL HERNIA.  The various methods of treatment have been discussed with the patient and family. After consideration of risks, benefits and other options for treatment, the patient has consented to  Procedure(s): XI ROBOTIC [place] (N/A) ESOPHAGOGASTRODUODENOSCOPY (EGD) (N/A) as a surgical intervention.  The patient's history has been reviewed, patient examined, no change in status, stable for surgery.  I have reviewed the patient's chart and labs.  Questions were answered to the patient's satisfaction.     [name]

## 2020-09-15 NOTE — Consult Note (Signed)
Advanced Heart Failure Team Consult Note   Primary Physician: Eulas Post, MD PCP-Cardiologist:  No primary care provider on file.  Reason for Consultation: Acute pulmonary edema.   HPI:    Jerry Kramer is seen today for evaluation of acute pulmonary edema at the request of Dr. Ellyn Hack.   65 y.o. with history of HTN, hyperlipidemia, GERD/hiatal hernia.  He is a nonsmoker, mother and 2 brothers had MIs in 35s but he had no prior cardiac history.  He has been dealing with peptic stricture and hiatal hernia for a while now; he saw Dr. Kipp Brood and underwent robotic assisted laparoscopic paraesophageal hernia repair with fundoplication.  Post-op, he was noted to have PVCs then developed chest pain with anterolateral ST elevation.  He was taken emergently for cath.  He was found to have acute occlusion of the proximal LAD.  This was treated with DES, and occluded D1 was treated with PTCA.  LVEDP was around 26 mmHg.  After procedure, patient developed respiratory distress.  He received a total of Lasix 80 mg IV and was intubated.   Review of Systems: All systems reviewed and negative except as per HPI.  Home Medications Prior to Admission medications   Medication Sig Start Date End Date Taking? Authorizing Provider  pantoprazole (PROTONIX) 40 MG tablet Take 1 tablet (40 mg total) by mouth daily. 03/20/20  Yes Pyrtle, Lajuan Lines, MD  sildenafil (VIAGRA) 100 MG tablet Take 1 tablet (100 mg total) by mouth daily as needed for erectile dysfunction. 11/07/19  Yes Burchette, Alinda Sierras, MD    Past Medical History: Past Medical History:  Diagnosis Date  . Allergy   . Colon polyps   . Complication of anesthesia    hard to wake up  . CONTACT DERMATITIS 09/03/2009  . ELEVATED BLOOD PRESSURE 06/19/2010  . HYPERLIPIDEMIA 08/27/2009  . UNSPECIFIED OTALGIA 06/19/2010    Past Surgical History: Past Surgical History:  Procedure Laterality Date  . APPENDECTOMY  1983  . BIOPSY  08/06/2020   Procedure:  BIOPSY;  Surgeon: Lavena Bullion, DO;  Location: [facility];  Service: Gastroenterology;;  esophageal manometry probe  placement  . ESOPHAGEAL MANOMETRY N/A 08/06/2020   Procedure: ESOPHAGEAL MANOMETRY (EM);  Surgeon: Lavena Bullion, DO;  Location: [facility];  Service: Gastroenterology;  Laterality: N/A;  . ESOPHAGOGASTRODUODENOSCOPY (EGD) WITH PROPOFOL N/A 08/06/2020   Procedure: ESOPHAGOGASTRODUODENOSCOPY (EGD) WITH PROPOFOL;  Surgeon: Lavena Bullion, DO;  Location: [facility];  Service: Gastroenterology;  Laterality: N/A;  . EYE MUSCLE SURGERY Right   . MIDDLE EAR SURGERY    . ROTATOR CUFF REPAIR Left   . TONSILLECTOMY  1965    Family History: Family History  Problem Relation Age of Onset  . Heart disease Mother 27       CAD  . Cancer Mother        lung  . Hyperlipidemia Father   . Stroke Father   . Colon polyps Father   . Diabetes Sister        type II  . Heart attack Brother 35  . Heart disease Brother 44       MI  . Colon cancer Neg Hx   . Esophageal cancer Neg Hx   . Pancreatic cancer Neg Hx   . Stomach cancer Neg Hx   . Rectal cancer Neg Hx     Social History: Social History   Socioeconomic History  . Marital status: Married    Spouse name: Not on file  .  Number of children: Not on file  . Years of education: Not on file  . Highest education level: Not on file  Occupational History  . Not on file  Tobacco Use  . Smoking status: Never Smoker  . Smokeless tobacco: Never Used  Vaping Use  . Vaping Use: Never used  Substance and Sexual Activity  . Alcohol use: No  . Drug use: No  . Sexual activity: Not on file  Other Topics Concern  . Not on file  Social History Narrative   Occupation: Secondary school teacher   Married   Never Smoked   Alcohol use- no   Social Determinants of Radio broadcast assistant Strain: Not on file  Food Insecurity: Not on file  Transportation Needs: Not on file  Physical Activity: Not on file  Stress: Not on  file  Social Connections: Not on file    Allergies:  Allergies  Allergen Reactions  . Meperidine Hcl Nausea And Vomiting  . Penicillins Other (See Comments)    REACTION: Childhood  . Advil [Ibuprofen] Palpitations  . Pseudoephedrine Hcl Palpitations    Objective:    Vital Signs:   Temp:  [97 F (36.1 C)-98 F (36.7 C)] 97 F (36.1 C) (03/21 1140) Pulse Rate:  [0-110] 36 (03/21 1829) Resp:  [0-40] 18 (03/21 1829) BP: (108-168)/(70-106) 108/75 (03/21 1829) SpO2:  [0 %-100 %] 99 % (03/21 1829) Arterial Line BP: (117-171)/(75-88) 171/87 (03/21 1325) FiO2 (%):  [100 %] 100 % (03/21 1815) Weight:  [83.9 kg] 83.9 kg (03/21 0607)    Weight change: Filed Weights   09/15/20 0607  Weight: 83.9 kg    Intake/Output:   Intake/Output Summary (Last 24 hours) at 09/15/2020 1851 Last data filed at 09/15/2020 1800 Gross per 24 hour  Intake 2700 ml  Output 1520 ml  Net 1180 ml      Physical Exam    General:  Well appearing. No resp difficulty HEENT: normal Neck: supple. JVP 12+. Carotids 2+ bilat; no bruits. No lymphadenopathy or thyromegaly appreciated. Cor: PMI nondisplaced. Regular rate & rhythm. No rubs, gallops or murmurs. Lungs: Crackles bilaterally.  Abdomen: soft, nontender, nondistended. No hepatosplenomegaly. No bruits or masses. Good bowel sounds. Extremities: no cyanosis, clubbing, rash, edema.  Mildly cool.  Neuro: alert & orientedx3, cranial nerves grossly intact. moves all 4 extremities w/o difficulty. Affect pleasant   Telemetry   NSR with PVCs (personally reviewed)  EKG    NSR with PVCs and anterolateral STEMI (personally reviewed)  Labs   Basic Metabolic Panel: Recent Labs  Lab 09/12/20 1523 09/15/20 1615 09/15/20 1617  NA 137 139 138  K 3.8 3.8 3.8  CL 110  --  105  CO2 20*  --   --   GLUCOSE 147*  --  205*  BUN 14  --  14  CREATININE 1.02  --  0.70  CALCIUM 9.1  --   --     Liver Function Tests: Recent Labs  Lab 09/12/20 1523  AST  26  ALT 22  ALKPHOS 72  BILITOT 0.7  PROT 6.5  ALBUMIN 4.1   No results for input(s): LIPASE, AMYLASE in the last 168 hours. No results for input(s): AMMONIA in the last 168 hours.  CBC: Recent Labs  Lab 09/12/20 1523 09/15/20 1542 09/15/20 1615 09/15/20 1617  WBC 5.0 15.4*  --   --   HGB 13.3 13.3 13.6 13.6  HCT 40.6 43.8 40.0 40.0  MCV 81.7 86.2  --   --  PLT 198 227  --   --     Cardiac Enzymes: No results for input(s): CKTOTAL, CKMB, CKMBINDEX, TROPONINI in the last 168 hours.  BNP: BNP (last 3 results) No results for input(s): BNP in the last 8760 hours.  ProBNP (last 3 results) No results for input(s): PROBNP in the last 8760 hours.   CBG: No results for input(s): GLUCAP in the last 168 hours.  Coagulation Studies: No results for input(s): LABPROT, INR in the last 72 hours.   Imaging    No results found.   Medications:     Current Medications: . [START ON 09/16/2020] aspirin  81 mg Oral Daily  . [START ON 09/16/2020] atorvastatin  80 mg Oral Daily  . [START ON 09/16/2020] enoxaparin (LOVENOX) injection  40 mg Subcutaneous Q24H  . fentaNYL      . furosemide  80 mg Intravenous Once  . HYDROmorphone      . insulin aspart  0-9 Units Subcutaneous Q4H  . ondansetron (ZOFRAN) IV  4 mg Intravenous Q6H  . pantoprazole (PROTONIX) IV  40 mg Intravenous Daily  . sodium chloride flush  3 mL Intravenous Q12H  . [START ON 09/16/2020] ticagrelor  90 mg Oral BID     Infusions: . sodium chloride    . sodium chloride    . acetaminophen    .  ceFAZolin (ANCEF) IV    . nitroGLYCERIN    . propofol (DIPRIVAN) infusion          Assessment/Plan   1.  CAD: Post-op anterolateral STEMI.  No prior CAD but history of HTN and hyperlipidemia and strong FH of CAD.  He had occluded proximal LAD and D1, now s/p DES to LAD and PTCA to D1.  - Continue ASA 81, Cangrelor until able to take Brilinta.  - Continue atorvastatin when able to take po meds.  2. Acute systolic  CHF: Due to anterolateral MI. LVEDP elevated with flash pulmonary edema. BP stable so far.  - Placing CVL, will need to follow CVP and co-ox.  Add milrinone vs IABP/Impella if co-ox low.  - Lasix 80 mg IV bid, next dose later tonight. Replace K.  - CXR.  3. PVCs: With increasing frequency, starting amiodarone.   Length of Stay: 0  Loralie Champagne, MD  09/15/2020, 6:51 PM  Advanced Heart Failure Team Pager [phone] (M-F; 7a - 5p)  Please contact [facility] for night-coverage after hours (4p -7a ) and weekends on [email or web address]

## 2020-09-15 NOTE — Brief Op Note (Signed)
09/15/2020  8:27 AM  PATIENT:  Simonne Come Ziller  65 y.o. male  PRE-OPERATIVE DIAGNOSIS:  HIATAL HERNIA  POST-OPERATIVE DIAGNOSIS:  HIATAL HERNIA  PROCEDURE:  Procedure(s): XI ROBOTIC ASSISTED HIATAL HERNIA REPAIR WITH MESH (N/A) ESOPHAGOGASTRODUODENOSCOPY (EGD) (N/A) XI ROBOTIC ASSISTED LAPAROSCOPIC FUNDOPLICATION  SURGEON:  Surgeon(s) and Role:    * Lightfoot, Lucile Crater, MD - Primary  PHYSICIAN ASSISTANT: Quanta Robertshaw PA-C   ANESTHESIA:   general  EBL:  400 mL   BLOOD ADMINISTERED: ONE UNIT PRBC'S  DRAINS: none   LOCAL MEDICATIONS USED:  EXPAREL  SPECIMEN:  HERNIA SAC  DISPOSITION OF SPECIMEN:  PATHOLOGY  COUNTS:  YES  TOURNIQUET:  * No tourniquets in log *  DICTATION: .Dragon Dictation  PLAN OF CARE: Admit to inpatient   PATIENT DISPOSITION:  PACU - hemodynamically stable.   Delay start of Pharmacological VTE agent (>24hrs) due to surgical blood loss or risk of bleeding: no

## 2020-09-15 NOTE — Progress Notes (Signed)
   09/15/20 1550  Clinical Encounter Type  Visited With Patient not available  Visit Type Other (Comment) (Code Stemi)  Referral From Nurse  Consult/Referral To Chaplain  Chaplain responded to code stemi page. Chaplain is not needed. Chaplain remains available if needed. This note was prepared by Jeanine Luz, M.Div..  For questions please contact by phone [phone].

## 2020-09-15 NOTE — Anesthesia Procedure Notes (Signed)
Arterial Line Insertion Start/End3/21/2022 7:10 AM, 09/15/2020 7:10 AM Performed by: CRNA  Patient location: Pre-op. Preanesthetic checklist: patient identified, IV checked, site marked, risks and benefits discussed, surgical consent, monitors and equipment checked, pre-op evaluation, timeout performed and anesthesia consent Lidocaine 1% used for infiltration Right, radial was placed Catheter size: 20 G Hand hygiene performed , maximum sterile barriers used  and Seldinger technique used Allen's test indicative of satisfactory collateral circulation Attempts: 1 Procedure performed without using ultrasound guided technique. Following insertion, dressing applied and Biopatch. Post procedure assessment: normal  Patient tolerated the procedure well with no immediate complications.

## 2020-09-15 NOTE — Progress Notes (Signed)
  Echocardiogram 2D Echocardiogram has been performed.  Jerry Kramer 09/15/2020, 7:38 PM

## 2020-09-15 NOTE — Consult Note (Signed)
Cardiology Consultation:   Patient ID: Jerry Kramer MRN: [id number]; DOB: [birth year]  Admit date: 09/15/2020 Date of Consult: 09/15/2020  PCP:  Eulas Post, MD   [place]  Cardiologist:  No primary care provider on file.  New Advanced Practice Provider:  No care team member to display Electrophysiologist:  None    Patient Profile:   Jerry Kramer is a 65 y.o. male with a hx of HLD, HTN and GERD who is being seen today for the evaluation of STEMI at the request of Dr. Kipp Brood.  History of Present Illness:   Jerry Kramer is a 65 yo male with PMH noted above. He has never been seen by cardiology in the past. Does have a strong family hx of coronary disease with mother having MI in her 23s and two brothers with MIs in their late 23s. Non-smoker. He was self referred to Dr. Kipp Brood for evaluation of hiatal hernia with esophageal stricture. Long hx of GERD which had been managed with antacids but over the past couple of months developed worsening symptoms with gagging and dysphagia. Was seen by GI and underwent endoscopy with dilation in 12/2019. In February underwent manometry along with repeat upper endoscopy showing mild stenosis at 34 cm from incisors. Last seen in the office with Dr. Kipp Brood on 3/17 with decision made to proceed with robotic assisted hiatal hernia repair with fundoplication.   Presented on 3/21 for procedure with Dr. Kipp Brood -> hiatal hernia repair with Nissen fundoplication.  While in the PACU following his surgery, the patient started having ventricular bigeminy on monitor and noted chest pain.  EKG showed ST elevations I, aVL as well as V4 and V5.  Once we were certain that Dr. Kipp Brood was okay with full anticoagulation, code STEMI was called the patient was brought urgently to the cardiac catheterization lab for catheterization.   Past Medical History:  Diagnosis Date  . Allergy   . Colon polyps   . Complication of anesthesia     hard to wake up  . CONTACT DERMATITIS 09/03/2009  . ELEVATED BLOOD PRESSURE 06/19/2010  . HYPERLIPIDEMIA 08/27/2009  . UNSPECIFIED OTALGIA 06/19/2010    Past Surgical History:  Procedure Laterality Date  . APPENDECTOMY  1983  . BIOPSY  08/06/2020   Procedure: BIOPSY;  Surgeon: Lavena Bullion, DO;  Location: [facility];  Service: Gastroenterology;;  esophageal manometry probe  placement  . ESOPHAGEAL MANOMETRY N/A 08/06/2020   Procedure: ESOPHAGEAL MANOMETRY (EM);  Surgeon: Lavena Bullion, DO;  Location: [facility];  Service: Gastroenterology;  Laterality: N/A;  . ESOPHAGOGASTRODUODENOSCOPY (EGD) WITH PROPOFOL N/A 08/06/2020   Procedure: ESOPHAGOGASTRODUODENOSCOPY (EGD) WITH PROPOFOL;  Surgeon: Lavena Bullion, DO;  Location: [facility];  Service: Gastroenterology;  Laterality: N/A;  . EYE MUSCLE SURGERY Right   . MIDDLE EAR SURGERY    . ROTATOR CUFF REPAIR Left   . TONSILLECTOMY  1965     Home Medications:  Prior to Admission medications   Medication Sig Start Date End Date Taking? Authorizing Provider  pantoprazole (PROTONIX) 40 MG tablet Take 1 tablet (40 mg total) by mouth daily. 03/20/20  Yes Pyrtle, Lajuan Lines, MD  sildenafil (VIAGRA) 100 MG tablet Take 1 tablet (100 mg total) by mouth daily as needed for erectile dysfunction. 11/07/19  Yes Burchette, Alinda Sierras, MD    Inpatient Medications: Scheduled Meds: . [START ON 09/16/2020] aspirin  81 mg Oral Daily  . [START ON 09/16/2020] atorvastatin  80 mg Oral Daily  . [  START ON 09/16/2020] enoxaparin (LOVENOX) injection  40 mg Subcutaneous Q24H  . fentaNYL      . furosemide  80 mg Intravenous Once  . HYDROmorphone      . insulin aspart  0-9 Units Subcutaneous Q4H  . ketorolac  15 mg Intravenous Q6H  . metoprolol tartrate  2.5 mg Intravenous Q8H  . ondansetron (ZOFRAN) IV  4 mg Intravenous Q6H  . pantoprazole (PROTONIX) IV  40 mg Intravenous Daily  . sodium chloride flush  3 mL Intravenous Q12H  . [START ON 09/16/2020] ticagrelor   90 mg Oral BID   Continuous Infusions: . sodium chloride    . sodium chloride    . acetaminophen    .  ceFAZolin (ANCEF) IV    . nitroGLYCERIN    . propofol (DIPRIVAN) infusion     PRN Meds: sodium chloride, acetaminophen, bisacodyl, fentaNYL (SUBLIMAZE) injection, fentaNYL (SUBLIMAZE) injection, hydrALAZINE, labetalol, morphine injection, morphine injection, ondansetron (ZOFRAN) IV, sodium chloride flush  Allergies:    Allergies  Allergen Reactions  . Meperidine Hcl Nausea And Vomiting  . Penicillins Other (See Comments)    REACTION: Childhood  . Advil [Ibuprofen] Palpitations  . Pseudoephedrine Hcl Palpitations    Social History:   Social History   Socioeconomic History  . Marital status: Married    Spouse name: Not on file  . Number of children: Not on file  . Years of education: Not on file  . Highest education level: Not on file  Occupational History  . Not on file  Tobacco Use  . Smoking status: Never Smoker  . Smokeless tobacco: Never Used  Vaping Use  . Vaping Use: Never used  Substance and Sexual Activity  . Alcohol use: No  . Drug use: No  . Sexual activity: Not on file  Other Topics Concern  . Not on file  Social History Narrative   Occupation: Secondary school teacher   Married   Never Smoked   Alcohol use- no   Social Determinants of Radio broadcast assistant Strain: Not on file  Food Insecurity: Not on file  Transportation Needs: Not on file  Physical Activity: Not on file  Stress: Not on file  Social Connections: Not on file  Intimate Partner Violence: Not on file    Family History:    Family History  Problem Relation Age of Onset  . Heart disease Mother 58       CAD  . Cancer Mother        lung  . Hyperlipidemia Father   . Stroke Father   . Colon polyps Father   . Diabetes Sister        type II  . Heart attack Brother 29  . Heart disease Brother 50       MI  . Colon cancer Neg Hx   . Esophageal cancer Neg Hx   . Pancreatic  cancer Neg Hx   . Stomach cancer Neg Hx   . Rectal cancer Neg Hx      ROS:  Please see the history of present illness.   All other ROS reviewed and negative.     Physical Exam/Data:   Vitals:   09/15/20 1825 09/15/20 1826 09/15/20 1827 09/15/20 1829  BP: (!) 118/93 117/78 115/70 108/75  Pulse: (!) 110 89 (!) 35 (!) 36  Resp: (!) 40 11 18 18   Temp:      TempSrc:      SpO2: 96% 98% 97% 99%  Weight:  Height:        Intake/Output Summary (Last 24 hours) at 09/15/2020 1833 Last data filed at 09/15/2020 1800 Gross per 24 hour  Intake 2700 ml  Output 1520 ml  Net 1180 ml   Last 3 Weights 09/15/2020 09/12/2020 09/11/2020  Weight (lbs) 185 lb 188 lb 188 lb 3.2 oz  Weight (kg) 83.915 kg 85.276 kg 85.367 kg     Body mass index is 28.98 kg/m.  General:  Well nourished, well developed, in no moderate distress dyspneic with severe chest pain -> mildly diaphoretic. HEENT: normal Lymph: no adenopathy Neck: no JVD Endocrine:  No thryomegaly Vascular: No carotid bruits; FA pulses 2+ bilaterally without bruits  Cardiac:  normal S1, S2; irregularly regular rhythm with frequent ectopy.; audible M/R/G. Lungs: notably increased work of breathing with basal rales. Abd: soft, nontender, no hepatomegaly  Ext: no edema Musculoskeletal:  No deformities, BUE and BLE strength normal and equal Skin: warm and dry  Neuro:  CNs 2-12 intact, no focal abnormalities noted Psych:  Normal affect ; extremely anxious.  EKG:  The EKG was personally reviewed and demonstrates:  SR with acute 2-3 mm ST elevation in lateral leads (I, aVL, V4 and V5), reciprocal changes in inferior leads, bigeminy  Relevant CV Studies:  N/a   Laboratory Data:  High Sensitivity Troponin:   Recent Labs  Lab 09/15/20 1542  TROPONINIHS 107*     Chemistry Recent Labs  Lab 09/12/20 1523 09/15/20 1615 09/15/20 1617  NA 137 139 138  K 3.8 3.8 3.8  CL 110  --  105  CO2 20*  --   --   GLUCOSE 147*  --  205*  BUN  14  --  14  CREATININE 1.02  --  0.70  CALCIUM 9.1  --   --   GFRNONAA >60  --   --   ANIONGAP 7  --   --     Recent Labs  Lab 09/12/20 1523  PROT 6.5  ALBUMIN 4.1  AST 26  ALT 22  ALKPHOS 72  BILITOT 0.7   Hematology Recent Labs  Lab 09/12/20 1523 09/15/20 1542 09/15/20 1615 09/15/20 1617  WBC 5.0 15.4*  --   --   RBC 4.97 5.08  --   --   HGB 13.3 13.3 13.6 13.6  HCT 40.6 43.8 40.0 40.0  MCV 81.7 86.2  --   --   MCH 26.8 26.2  --   --   MCHC 32.8 30.4  --   --   RDW 15.5 15.6*  --   --   PLT 198 227  --   --    BNPNo results for input(s): BNP, PROBNP in the last 168 hours.  DDimer No results for input(s): DDIMER in the last 168 hours.   Radiology/Studies:  DG Chest 2 View  Result Date: 09/12/2020 CLINICAL DATA:  Preoperative surgery. EXAM: CHEST - 2 VIEW COMPARISON:  CT scan of the chest September 10, 2020 FINDINGS: The patient has known moderate-sized hiatal hernia is identified. The heart, hila, mediastinum, lungs, and pleura are unremarkable. No acute abnormalities. IMPRESSION: No active cardiopulmonary disease. Electronically Signed   By: Dorise Bullion III M.D   On: 09/12/2020 16:17     Assessment and Plan:   1.  Acute anterolateral STEMI: EKG concerning for possible diagonal occlusion, however initial angiography revealed 100 and proximal occluded LAD. => He underwent angioplasty of both the LAD and the diagonal branch with to be 2.5 flow distally at the  end of the case the diagonal branch with TIMI-3 flow in the major branch the diagonal and LAD that was treated with DES stent across the diagonal branch.  . --> Proximal to mid LAD PCI: Resolute Onyx DES 3.5 mm x 30 mm postdilated proximally to 4.1 mm distally to 3.6 mm; distal overlapping 3.0 mm 8 mm stent postdilated at the overlap to 3.6 mm. --> Post cath, the patient became extremely dyspneic, diaphoretic with flash pulmonary edema.  LVEDP was at least 25 to 26 mmHg.  Hemodynamically was stable with blood  pressure in the 120s. ->  Upon completion of the procedure, the patient was given  2 mg morphine x2 as well as 40 mg Lasix x2.  He was still having increased work of breathing, and appeared to be tiring out.  We have consulted Dr. Valeta Harms from [facility] is to intubate the patient for respiratory protection to allow for sedation and diuresis.  They will also place a right subclavian central line -> Per request of Dr. Dr. Algernon Huxley.  We will get a stat 2D echo in the CCU. Run IV nitroglycerin as blood pressure tolerates, Will give an additional 80 mg IV Lasix in a couple hours and if blood pressure/heart rate tolerates, consider IV Lopressor 2.5 mg every 8 hours based on sinus tachycardia with significant ectopy.   Risk Assessment/Risk Scores:     TIMI Risk Score for ST  Elevation MI:   The patient's TIMI risk score is 4, which indicates a 7.3% risk of all cause mortality at 30 days.   New York Heart Association (NYHA) Functional Class NYHA Class III        For questions or updates, please contact [facility] Please consult [email or web address] for contact info under    Signed, Glenetta Hew, MD  09/15/2020 6:33 PM

## 2020-09-15 NOTE — Procedures (Signed)
Central Venous Catheter Insertion Procedure Note  LAYSON BERTSCH  [id number]  11/20/1955  Date:09/15/20  Time:6:44 PM   Provider Performing:Prentice Sackrider Shearon Stalls   Procedure: Insertion of Non-tunneled Central Venous Catheter(36556) without US guidance  Indication(s) Medication administration  Consent Unable to obtain consent due to emergent nature of procedure.  Anesthesia Topical only with 1% lidocaine   Timeout Verified patient identification, verified procedure, site/side was marked, verified correct patient position, special equipment/implants available, medications/allergies/relevant history reviewed, required imaging and test results available.  Sterile Technique Maximal sterile technique including full sterile barrier drape, hand hygiene, sterile gown, sterile gloves, mask, hair covering, sterile ultrasound probe cover (if used).  Procedure Description Area of catheter insertion was cleaned with chlorhexidine and draped in sterile fashion.  Without real-time ultrasound guidance a central venous catheter was placed into the left internal jugular vein. Nonpulsatile blood flow and easy flushing noted in all ports.  The catheter was sutured in place and sterile dressing applied.  Complications/Tolerance None; patient tolerated the procedure well. Chest X-ray is ordered to verify placement for internal jugular or subclavian cannulation.   Chest x-ray is not ordered for femoral cannulation.  EBL Minimal  Specimen(s) None   Montey Hora, PA - [facility] For pager details, please see AMION If no response to pager, please call [phone] until 7:00 PM After 7:00 PM, please call Elink at [phone] 09/15/2020, 6:44 PM

## 2020-09-15 NOTE — Plan of Care (Signed)
  Problem: Education: Goal: Ability to demonstrate management of disease process will improve Outcome: Progressing Goal: Ability to verbalize understanding of medication therapies will improve Outcome: Progressing   Problem: Cardiac: Goal: Ability to achieve and maintain adequate cardiopulmonary perfusion will improve Outcome: Progressing   Problem: Cardiac: Goal: Ability to achieve and maintain adequate cardiopulmonary perfusion will improve Outcome: Progressing Goal: Vascular access site(s) Level 0-1 will be maintained Outcome: Progressing   Problem: Skin Integrity: Goal: Demonstration of wound healing without infection will improve Outcome: Progressing

## 2020-09-15 NOTE — Interval H&P Note (Signed)
History and Physical Interval Note:  09/15/2020 4:07 PM  Jerry Kramer  has presented today for surgery, with the diagnosis of lateral STEMI post up -.  The various methods of treatment have been discussed with the patient and family. After consideration of risks, benefits and other options for treatment, the patient has consented to  Procedure(s): LEFT HEART CATH AND CORONARY ANGIOGRAPHY (N/A) Percutaneous coronary intervention   as a surgical intervention.  The patient's history has been reviewed, patient examined, no change in status, stable for surgery.  I have reviewed the patient's chart and labs.  Questions were answered to the patient's satisfaction.     Glenetta Hew

## 2020-09-15 NOTE — Transfer of Care (Signed)
Immediate Anesthesia Transfer of Care Note  Patient: Jerry Kramer  Procedure(s) Performed: XI ROBOTIC ASSISTED HIATAL HERNIA REPAIR WITH MESH (N/A Chest) ESOPHAGOGASTRODUODENOSCOPY (EGD) (N/A ) XI ROBOTIC ASSISTED LAPAROSCOPIC FUNDOPLICATION (Abdomen)  Patient Location: PACU  Anesthesia Type:General  Level of Consciousness: drowsy and patient cooperative  Airway & Oxygen Therapy: Patient Spontanous Breathing and Patient connected to face mask oxygen  Post-op Assessment: Report given to RN and Post -op Vital signs reviewed and stable  Post vital signs: Reviewed and stable  Last Vitals:  Vitals Value Taken Time  BP 139/89 09/15/20 1141  Temp    Pulse 77 09/15/20 1146  Resp 20 09/15/20 1146  SpO2 99 % 09/15/20 1146  Vitals shown include unvalidated device data.  Last Pain:  Vitals:   09/15/20 0621  TempSrc:   PainSc: 0-No pain      Patients Stated Pain Goal: 3 (56/15/37 9432)  Complications: No complications documented.

## 2020-09-15 NOTE — Consult Note (Addendum)
NAME:  Jerry Kramer, MRN:  [id number], DOB:  09-28-1955, LOS: 0 ADMISSION DATE:  09/15/2020, CONSULTATION DATE:  3/21 REFERRING MD:  Dr Kipp Brood, CHIEF COMPLAINT:  Dyspnea   History of Present Illness:  65 year old-year-old male with past medical history as below, which is significant for hypertension and hyperlipidemia.  Medical history is also significant for longstanding GERD which has been managed with proton pump inhibitors.  In the months leading up to admission his symptoms had worsened to include dysphagia.  He began following with gastroenterology in 2021 and underwent upper endoscopy with dilation in July of that year.  Work-up also significant for lower esophageal stricture and hiatal hernia. He presented 3/21 for hiatal hernia repair. The procedure itself was uncomplicated, however, post-operatively he developed chest pain. EKG confirmed STEMI and he was taken to the cath lab where DES was placed to LAD by Dr Ellyn Hack. After cardiac cath he continued to be short of breath despite 11m lasix. PCCM consulted for ongoing care.   Pertinent  Medical History   has a past medical history of Allergy, Colon polyps, Complication of anesthesia, CONTACT DERMATITIS (09/03/2009), ELEVATED BLOOD PRESSURE (06/19/2010), HYPERLIPIDEMIA (08/27/2009), and UNSPECIFIED OTALGIA (06/19/2010).   Significant Hospital Events: Including procedures, antibiotic start and stop dates in addition to other pertinent events   . 3/21 presented to elective hiatal hernia repair. STEMI post op. LAD stented in cath lab. Transfer to ICU on heated high flow.   Interim History / Subjective:    Objective   Blood pressure (!) 144/90, pulse (!) 45, temperature (!) 97 F (36.1 C), resp. rate 19, height _0  (1.702 m), weight 83.9 kg, SpO2 96 %.        Intake/Output Summary (Last 24 hours) at 09/15/2020 1743 Last data filed at 09/15/2020 1400 Gross per 24 hour  Intake 2700 ml  Output 995 ml  Net 1705 ml   Filed Weights    09/15/20 0607  Weight: 83.9 kg    Examination: General: adult male in mild respiratory distress HENT: /At, PERRL, no JVD Lungs: Coarse crackles throughout. Tachypnea.  Cardiovascular: RRR, no MRG. No edema.  Abdomen: Soft, non-tender, non-distended Extremities: No acute deformity or ROM limitation.  Neuro: Alert, oriented, non-focal   Labs/imaging that I have personally reviewed   CT chest 3/22 > moderate size hiatal hernia and mild circumferential esophageal thickening.   Resolved Hospital Problem list     Assessment & Plan:   Acute hypoxemic respiratory failure secondary to pulmonary edema in the setting of acute heart failure/ACS - Move to intubation now given increased WOB and distress. High risk for BiPAP considering hiatal hernia repair today.  - Continue Nitro gtt. - Morphine PRN. - CXR in AM. - Propofol/PRN fentanyl for RASS goal -1 to -2.   LAD occlusion - s/p PCI with DES. - Continue Cangrelor until taking PO then switch to Brillinta per cards - Cards following  Hiatal hernia with Schatzki Ring c/b dysphagia - s/p esophagoscopy, robotic assisted laparoscopy, paraesophageal hernia repair with A-cell mash, Dor fundoplication. - Per TCTS - NPO   Hyperglycemia - SSI. - Assess Hgb A1c.  Hx HTN, HLD No home medications on chart - Supportive care.   Best practice (evaluated daily)  Diet:  NPO Pain/Anxiety/Delirium protocol (if indicated): Yes (RASS goal -1,-2) VAP protocol (if indicated): Yes DVT prophylaxis: per primary GI prophylaxis: PPI Glucose control:  SSI Yes Central venous access:  N/A Arterial line:  N/A Foley:  Yes, and it is still needed Mobility:  bed rest  PT consulted: N/A Last date of multidisciplinary goals of care discussion [3/21] Code Status:  full code Disposition: ICU  Labs   CBC: Recent Labs  Lab 09/12/20 1523 09/15/20 1542 09/15/20 1617  WBC 5.0 15.4*  --   HGB 13.3 13.3 13.6  HCT 40.6 43.8 40.0  MCV 81.7 86.2  --    PLT 198 227  --     Basic Metabolic Panel: Recent Labs  Lab 09/12/20 1523 09/15/20 1617  NA 137 138  K 3.8 3.8  CL 110 105  CO2 20*  --   GLUCOSE 147* 205*  BUN 14 14  CREATININE 1.02 0.70  CALCIUM 9.1  --    GFR: Estimated Creatinine Clearance: 97.9 mL/min (by C-G formula based on SCr of 0.7 mg/dL). Recent Labs  Lab 09/12/20 1523 09/15/20 1542  WBC 5.0 15.4*    Liver Function Tests: Recent Labs  Lab 09/12/20 1523  AST 26  ALT 22  ALKPHOS 72  BILITOT 0.7  PROT 6.5  ALBUMIN 4.1   No results for input(s): LIPASE, AMYLASE in the last 168 hours. No results for input(s): AMMONIA in the last 168 hours.  ABG    Component Value Date/Time   PHART 7.434 09/12/2020 1536   PCO2ART 31.5 (L) 09/12/2020 1536   PO2ART 88.7 09/12/2020 1536   HCO3 20.8 09/12/2020 1536   TCO2 17 (L) 09/15/2020 1617   ACIDBASEDEF 2.8 (H) 09/12/2020 1536   O2SAT 96.9 09/12/2020 1536     Coagulation Profile: Recent Labs  Lab 09/12/20 1523  INR 1.1    Cardiac Enzymes: No results for input(s): CKTOTAL, CKMB, CKMBINDEX, TROPONINI in the last 168 hours.  HbA1C: No results found for: HGBA1C  CBG: No results for input(s): GLUCAP in the last 168 hours.  Review of Systems:   Bolds are positive  Constitutional: weight loss, gain, night sweats, Fevers, chills, fatigue .  HEENT: headaches, Sore throat, sneezing, nasal congestion, post nasal drip, Difficulty swallowing, Tooth/dental problems, visual complaints visual changes, ear ache CV:  chest pain, radiates:,Orthopnea, PND, swelling in lower extremities, dizziness, palpitations, syncope.  GI  heartburn, indigestion, abdominal pain, nausea, vomiting, diarrhea, change in bowel habits, loss of appetite, bloody stools.  Resp: cough, productive:, hemoptysis, dyspnea, chest pain, pleuritic.  Skin: rash or itching or icterus GU: dysuria, change in color of urine, urgency or frequency. flank pain, hematuria  MS: joint pain or swelling.  decreased range of motion  Psych: change in mood or affect. depression or anxiety.  Neuro: difficulty with speech, weakness, numbness, ataxia    Past Medical History:  He,  has a past medical history of Allergy, Colon polyps, Complication of anesthesia, CONTACT DERMATITIS (09/03/2009), ELEVATED BLOOD PRESSURE (06/19/2010), HYPERLIPIDEMIA (08/27/2009), and UNSPECIFIED OTALGIA (06/19/2010).   Surgical History:   Past Surgical History:  Procedure Laterality Date  . APPENDECTOMY  1983  . BIOPSY  08/06/2020   Procedure: BIOPSY;  Surgeon: Lavena Bullion, DO;  Location: [facility];  Service: Gastroenterology;;  esophageal manometry probe  placement  . ESOPHAGEAL MANOMETRY N/A 08/06/2020   Procedure: ESOPHAGEAL MANOMETRY (EM);  Surgeon: Lavena Bullion, DO;  Location: [facility];  Service: Gastroenterology;  Laterality: N/A;  . ESOPHAGOGASTRODUODENOSCOPY (EGD) WITH PROPOFOL N/A 08/06/2020   Procedure: ESOPHAGOGASTRODUODENOSCOPY (EGD) WITH PROPOFOL;  Surgeon: Lavena Bullion, DO;  Location: [facility];  Service: Gastroenterology;  Laterality: N/A;  . EYE MUSCLE SURGERY Right   . MIDDLE EAR SURGERY    . ROTATOR CUFF REPAIR Left   . TONSILLECTOMY  1965     Social History:   reports that he has never smoked. He has never used smokeless tobacco. He reports that he does not drink alcohol and does not use drugs.   Family History:  His family history includes Cancer in his mother; Colon polyps in his father; Diabetes in his sister; Heart attack (age of onset: 71) in his brother; Heart disease (age of onset: 30) in his brother and mother; Hyperlipidemia in his father; Stroke in his father. There is no history of Colon cancer, Esophageal cancer, Pancreatic cancer, Stomach cancer, or Rectal cancer.   Allergies Allergies  Allergen Reactions  . Meperidine Hcl Nausea And Vomiting  . Penicillins Other (See Comments)    REACTION: Childhood  . Advil [Ibuprofen] Palpitations  . Pseudoephedrine Hcl  Palpitations     Home Medications  Prior to Admission medications   Medication Sig Start Date End Date Taking? Authorizing Provider  pantoprazole (PROTONIX) 40 MG tablet Take 1 tablet (40 mg total) by mouth daily. 03/20/20  Yes Pyrtle, Lajuan Lines, MD  sildenafil (VIAGRA) 100 MG tablet Take 1 tablet (100 mg total) by mouth daily as needed for erectile dysfunction. 11/07/19  Yes Burchette, Alinda Sierras, MD     Critical care time: 56 minutes     Georgann Housekeeper, AGACNP-BC [place] for personal pager PCCM on call pager [phone] until 7pm. Please call Elink 7p-7a. [phone]  09/15/2020 6:20 PM   Pulmonary critical care attending:  65 year old gentleman past medical history of hypertension hyperlipidemia also with reflux.  Patient came as an outpatient today for evaluation of a lower esophageal stricture and hiatal hernia.  Patient underwent repair by Dr. Kipp Brood.  Postoperatively in the PACU patient developed chest pain with EKG findings concerning for STEMI.  Patient was taken to the Cath Lab by Dr. Ellyn Hack and underwent DES placement into the LAD.  BP 108/75   Pulse (!) 36   Temp (!) 97 F (36.1 C)   Resp 18   Ht _0  (1.702 m)   Wt 83.9 kg   SpO2 99%   BMI 28.98 kg/m   General: Male, diaphoretic, labored breathing, tachypneic HEENT: Tracking appropriately Heart: Regular rhythm S1-S2 Lungs: Crackles bilaterally Abdomen: Mildly distended, abdominal muscles for use of respiration.  Labs: Reviewed  Assessment: STEMI, acute occlusion of LAD Acute pulmonary edema Acute hypoxemic respiratory failure Postop from EGD, robotic assisted laparoscopic paraesophageal hernia repair with acellular mesh and/or fundoplication. Worsening distal extremity cyanosis concern for cardiogenic shock development  Plan: Transfer to the intensive care unit Intubation Likely CVL placement We will obtain postop labs [place] pending Chest x-ray pending Case  discussed with interventional cardiology as well as thoracic surgery, Drs. Ellyn Hack and [place].  I met with family in the waiting room to discuss above procedures.  Consent obtained.  This patient is critically ill with multiple organ system failure; which, requires frequent high complexity decision making, assessment, support, evaluation, and titration of therapies. This was completed through the application of advanced monitoring technologies and extensive interpretation of multiple databases. During this encounter critical care time was devoted to patient care services described in this note for 60 minutes.  [facility] 09/15/2020 6:38 PM

## 2020-09-15 NOTE — Op Note (Signed)
[address]       [place] [address]             [phone]        09/15/2020  Patient:  Jerry Kramer Pre-Op Dx:  Hiatal Hernia   Schatzki Ring   Dysphagia    Post-op Dx:  same Procedure: - Esophagoscopy - Robotic assisted laparoscopy - Paraesophageal hernia repair with A-cell mesh - Dor Fundoplication   Surgeon and Role:      * Leaner Morici, Lucile Crater, MD - Primary    Evonnie Pat, PA-C - assisting  Anesthesia  general EBL:  385ml Blood Administration: none Specimen:  Hernia sac   Counts: correct   Indications: 65 year old male with hernia along with esophageal stricture.  We had a long discussion in regards to the risks and benefits of proceeding with a robotic assisted hiatal hernia repair with fundoplication.  I think that his main indication is the fact that he has a esophageal stricture.  Furthermore explained to him that given his stricture he may continue to have some dysphagia but at least with a fundoplication that this potentially would prevent the progression of this.  Based on the integrity of his muscle I will make a decision about using mesh intraoperatively.  He is agreeable to proceed and is tentatively scheduled for the 21st of this month.  Findings: Patulous esophagus.  Moderate sized hiatal defect.  4 interrupted sutures used for repair.  Omentum, and stomach in the defect  Operative Technique: After the risks, benefits and alternatives were thoroughly discussed, the patient was brought to the operative theatre.  Anesthesia was induced, and the esophagoscope was passed through the oropharynx down to the stomach.  The scope was retroflexed and the hiatal hernia was clearly evident.  The scope was pulled back and the mucosal surface of the esophagus was visualized.    The scope was then parked at 25 cm from the incisors.  The patient was then prepped and draped in normal sterile fashion.  An appropriate surgical pause was performed, and  pre-operative antibiotics were dosed accordingly.  We began with a 1 cm incision 15 cm caudad from the xiphoid and slightly lateral to the umbilicus.  Using an Optiview we entered the peritoneal space.  The abdomen was then insufflated with CO2.  3 other robotic ports were placed to triangulate the hiatus.  Another 12 mm port was placed in place at the level of the umbilicus laterally for an assistant port and another 5 mm trocar was placed in the right lower quadrant for liver retractor.  The patient was then placed in steep reverse Trendelenburg and the liver was elevated to expose the esophageal hiatus.  And then the robot was docked.  We began by dividing the gastrohepatic ligament to expose the right diaphragmatic crus and then dissected the hernia sac in a clockwise fashion to mobilize there the stomach and esophagus.  We then divided the short gastrics and moved towards the right crus and completed our dissection along the esophageal hiatus.  A Penrose drain was then used to encircle the the esophagus and we continued our dissection up into the mediastinum.  Once we had achieved 3 to 4 cm of intra-abdominal esophagus we then proceeded to reapproximate the crura with 0 Ethibond sutures in an interrupted fashion.  A-cell pledgets were used to buttress the crux.  The mesh was then tacked to the repair.  The gastroscope was passed down through  the lower esophageal sphincter into the stomach and would act as our bougie during this repair.  Next the stomach was passed anterior to the esophagus and  Dor fundoplication was performed.  An air leak test was performed using the gastroscope.  No leak was evident.  The liver retractor was removed and all ports were removed under direct visualization.  The skin and soft tissue were closed with absorbable suture    The patient tolerated the procedure without any immediate complications, and was transferred to the PACU in stable condition.  Braidan Ricciardi Bary Leriche

## 2020-09-15 NOTE — Hospital Course (Addendum)
  History of Present Illness:    Jerry Kramer 65 y.o. male who is self-referred for surgical evaluation of a hiatal hernia along with an esophageal stricture.  The patient has had a long history of gastroesophageal reflux which has been managed with antacids, but over the last several months he has noted episodes of gagging and dysphagia if he attempts to eat too fast.  He has been seen by gastroenterologist and underwent an upper endoscopy with dilation in July 2021.  He was noted to have a moderate lower esophageal stricture along with a hiatal hernia.  In February 2022 he underwent manometry along with repeat upper endoscopy which showed a mild stenosis at 34 cm from the incisors.   Regards to his symptoms he only complains of dysphagia when he does not chew his food well.  His reflux symptoms have been well controlled with his medications.  He does not have a chronic cough or respiratory symptoms.  He denies any dysphagia. Patient and all relevant studies were reviewed by Dr. Kipp Brood who recommended proceeding with robotic repair.  He was admitted this hospitalization electively for the procedure.  Hospital course the patient was admitted and on 09/15/2020 taken to the operating room at which time he underwent robotic assisted hiatal hernia repair with mesh as well as esophagogastroduodenoscopy.  Additionally, he had a robotic assisted laparoscopic fundoplication.  He tolerated the procedure well and was taken to the postanesthesia care unit in stable condition.  Initially in the PACU he was quite stable but he did develop an episode of significant chest pain associated with PVCs.  An EKG showed ST elevation in the anterolateral leads and urgent cardiology consultation was obtained.  He was subsequently taken to the cardiac catheterization lab emergently where he was found to have acute occluded proximal LAD.  He was treated with DES and occluded diagonal #1 was treated with a PTCA.  Following the  procedure the patient developed respiratory distress.  He received intravenous Lasix and was also intubated.  Critical care medicine has been consulted to assist with management.  He initially required milrinone drip but this has been weaned over time.  Additionally has undergone an excellent diuresis.  He has been started on spironolactone as well as digoxin and Entresto for heart failure management as echocardiogram shows ejection fraction in the 25 to 30% range.  He will also require LifeVest at the time of discharge.  He was extubated without too much difficulty as his acute pulmonary edema has resolved with improved chest x-ray appearance as well.  He did pass his esophageal swallow study without evidence of leak.  There is some evidence of dysmotility in the esophagus.

## 2020-09-15 NOTE — Brief Op Note (Signed)
09/15/2020  7:06 PM  PATIENT:  Jerry Kramer  65 y.o. 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  Acute hypoxic respite failure secondary to acute pulmonary edema -> patient will be intubated upon arrival to CCU  PROCEDURE:  Procedure(s): LEFT HEART CATH AND CORONARY ANGIOGRAPHY (N/A) Coronary/Graft Acute MI Revascularization (N/A)  SURGEON:  Surgeon(s) and Role:    * Leonie Man, MD - Primary  PHYSICIAN ASSISTANT: Images reviewed with Dr. Harrell Gave End  ANESTHESIA:   local and He still had sedation on board from his initial surgery.  Total of 4 mg IV morphine given post procedure  EBL:  <20 mL  Time Out: Verified patient identification, verified procedure, site/side was marked, verified correct patient position, special equipment/implants available, medications/allergies/relevent history reviewed, required imaging and test results available. Performed.  Access:  . RIGHT Radial Artery: 6 Fr sheath -- Seldinger technique using Micropuncture Kit -- Direct ultrasound guidance used.  Permanent image obtained and placed on chart. -- 10 mL radial cocktail IA; 4000 units IV Heparin (he was given 4000 units prior to arrival to the Cath Lab)  Left Heart Catheterization: 5&6Fr Catheters advanced or  exchanged over a J-wire under direct fluoroscopic guidance into the ascending aorta; JR4 catheter advanced first.  . * LV Hemodynamics (LV Gram): 5 [place] JR4 Catheter . * Left Coronary Artery Cineangiography: 6 French XB 3.5 guide catheter  . * Right Coronary Artery Cineangiography: JR4 catheter   Review of initial angiography revealed: 100% thrombotic occlusion of the LAD just after SP1  Preparations are made for PCI of the LAD  -> Complex bifurcation PCI of LAD and PTCA of ostial D1 and small side branch of D1. ->   Resolute Onyx DES 3.5 mm x 30 mm postdilated to 4.1 mm proximal and 3.6 mm distal, overlapped distally with a 3.0 mm x 8 mm stent with overlap postdilated to 3.6 mm. ->  Post stent imaging revealed widely patent diagonal branch.  With no evidence of stenosis or thrombosis I chose not to rewire the fresh stent.  Because of his postop state, I chose to use ticagrelor infusion along with IV heparin to avoid potential postop bleeding.  Upon completion of Angiogaphy, the catheter was removed completely out of the body over a wire, without complication.  Radial sheath removed in the Cardiac Catheterization lab with TR Band placed for hemostasis.  TR Band: 1740 Hours; 18 mL air  MEDICATIONS . SQ Lidocaine 3 mL . Radial Cocktail: 3 mg Verapmil in 10 mL NS . Heparin: Total 9000 units . IC NTG 200 mcg x 1-> followed by IV nitroglycerin infusion at 10 mcg/min . IV Lasix 40 mg x 2 . IV morphine 2 mg x 2 in addition to him recovering from postop sedation.  COUNTS:  YES  DICTATION: .Sales executive  PLAN OF  CARE: Admit to CCU.  The patient will be intubated by pulmonary critical care team for airway protection and acute hypoxic respiratory failure.   Continue IV diuresis overnight  If significant ectopy continues, will start IV amiodarone  IV nitroglycerin overnight for maintain blood pressure control and afterload reduction.  We will continue Kengreal infusion until able  to take p.o.  PATIENT DISPOSITION:  ICU - intubated and critically ill.   Delay start of Pharmacological VTE agent (>24hrs) due to surgical blood loss or risk of bleeding: not applicable    Glenetta Hew, M.D., M.S. Interventional Cardiologist   Pager # [phone]

## 2020-09-16 ENCOUNTER — Encounter (HOSPITAL_COMMUNITY): Payer: Self-pay | Admitting: Cardiology

## 2020-09-16 ENCOUNTER — Inpatient Hospital Stay (HOSPITAL_COMMUNITY): Payer: 59

## 2020-09-16 DIAGNOSIS — R570 Cardiogenic shock: Secondary | ICD-10-CM

## 2020-09-16 DIAGNOSIS — I5021 Acute systolic (congestive) heart failure: Secondary | ICD-10-CM | POA: Diagnosis not present

## 2020-09-16 DIAGNOSIS — I2102 ST elevation (STEMI) myocardial infarction involving left anterior descending coronary artery: Secondary | ICD-10-CM | POA: Diagnosis not present

## 2020-09-16 LAB — COOXEMETRY PANEL
Carboxyhemoglobin: 0.7 % (ref 0.5–1.5)
Carboxyhemoglobin: 1 % (ref 0.5–1.5)
Methemoglobin: 1.2 % (ref 0.0–1.5)
Methemoglobin: 1.2 % (ref 0.0–1.5)
O2 Saturation: 56.1 %
O2 Saturation: 60 %
Total hemoglobin: 12.1 g/dL (ref 12.0–16.0)
Total hemoglobin: 11.1 g/dL — ABNORMAL LOW (ref 12.0–16.0)

## 2020-09-16 LAB — CBC
HCT: 38.1 % — ABNORMAL LOW (ref 39.0–52.0)
Hemoglobin: 12 g/dL — ABNORMAL LOW (ref 13.0–17.0)
MCH: 26.7 pg (ref 26.0–34.0)
MCHC: 31.5 g/dL (ref 30.0–36.0)
MCV: 84.7 fL (ref 80.0–100.0)
Platelets: 216 10*3/uL (ref 150–400)
RBC: 4.5 MIL/uL (ref 4.22–5.81)
RDW: 16.1 % — ABNORMAL HIGH (ref 11.5–15.5)
WBC: 17 10*3/uL — ABNORMAL HIGH (ref 4.0–10.5)
nRBC: 0 % (ref 0.0–0.2)

## 2020-09-16 LAB — BASIC METABOLIC PANEL
Anion gap: 6 (ref 5–15)
BUN: 21 mg/dL (ref 8–23)
CO2: 24 mmol/L (ref 22–32)
Calcium: 8.4 mg/dL — ABNORMAL LOW (ref 8.9–10.3)
Chloride: 104 mmol/L (ref 98–111)
Creatinine, Ser: 1.63 mg/dL — ABNORMAL HIGH (ref 0.61–1.24)
GFR, Estimated: 47 mL/min — ABNORMAL LOW (ref >60)
Glucose, Bld: 151 mg/dL — ABNORMAL HIGH (ref 70–99)
Potassium: 4.6 mmol/L (ref 3.5–5.1)
Sodium: 134 mmol/L — ABNORMAL LOW (ref 135–145)

## 2020-09-16 LAB — LACTIC ACID, PLASMA
Lactic Acid, Venous: 2.5 mmol/L (ref 0.5–1.9)
Lactic Acid, Venous: 1.4 mmol/L (ref 0.5–1.9)

## 2020-09-16 LAB — TRIGLYCERIDES: Triglycerides: 74 mg/dL (ref <150)

## 2020-09-16 LAB — GLUCOSE, CAPILLARY
Glucose-Capillary: 113 mg/dL — ABNORMAL HIGH (ref 70–99)
Glucose-Capillary: 124 mg/dL — ABNORMAL HIGH (ref 70–99)
Glucose-Capillary: 137 mg/dL — ABNORMAL HIGH (ref 70–99)
Glucose-Capillary: 138 mg/dL — ABNORMAL HIGH (ref 70–99)
Glucose-Capillary: 140 mg/dL — ABNORMAL HIGH (ref 70–99)
Glucose-Capillary: 145 mg/dL — ABNORMAL HIGH (ref 70–99)
Glucose-Capillary: 171 mg/dL — ABNORMAL HIGH (ref 70–99)

## 2020-09-16 LAB — SURGICAL PATHOLOGY

## 2020-09-16 MED ORDER — DIGOXIN 125 MCG PO TABS
0.1250 mg | ORAL_TABLET | Freq: Every day | ORAL | Status: DC
  Administered 2020-09-16 – 2020-09-19 (×4): 0.125 mg via ORAL
  Filled 2020-09-16 (×4): qty 1

## 2020-09-16 MED ORDER — ASPIRIN 300 MG RE SUPP
150.0000 mg | Freq: Every day | RECTAL | Status: DC
  Administered 2020-09-16: 150 mg via RECTAL
  Filled 2020-09-16: qty 1

## 2020-09-16 MED ORDER — TICAGRELOR 90 MG PO TABS
180.0000 mg | ORAL_TABLET | Freq: Once | ORAL | Status: AC
  Administered 2020-09-16: 180 mg via ORAL
  Filled 2020-09-16: qty 2

## 2020-09-16 MED ORDER — TICAGRELOR 90 MG PO TABS
90.0000 mg | ORAL_TABLET | Freq: Once | ORAL | Status: AC
  Administered 2020-09-16: 90 mg via ORAL
  Filled 2020-09-16: qty 1

## 2020-09-16 MED ORDER — SODIUM CHLORIDE 0.9% FLUSH
10.0000 mL | Freq: Two times a day (BID) | INTRAVENOUS | Status: DC
  Administered 2020-09-16 – 2020-09-19 (×5): 10 mL

## 2020-09-16 MED ORDER — SODIUM CHLORIDE 0.9% FLUSH: 10.0000 mL | INTRAVENOUS | Status: DC | PRN

## 2020-09-16 MED ORDER — TICAGRELOR 90 MG PO TABS
90.0000 mg | ORAL_TABLET | Freq: Two times a day (BID) | ORAL | Status: DC
  Administered 2020-09-17 – 2020-09-19 (×5): 90 mg via ORAL
  Filled 2020-09-16 (×5): qty 1

## 2020-09-16 MED ORDER — SPIRONOLACTONE 12.5 MG HALF TABLET
12.5000 mg | ORAL_TABLET | Freq: Every day | ORAL | Status: DC
  Administered 2020-09-16 – 2020-09-17 (×2): 12.5 mg via ORAL
  Filled 2020-09-16 (×2): qty 1

## 2020-09-16 MED ORDER — ORAL CARE MOUTH RINSE
15.0000 mL | Freq: Two times a day (BID) | OROMUCOSAL | Status: DC
  Administered 2020-09-16 – 2020-09-17 (×4): 15 mL via OROMUCOSAL

## 2020-09-16 MED ORDER — MILRINONE LACTATE IN DEXTROSE 20-5 MG/100ML-% IV SOLN
0.1250 ug/kg/min | INTRAVENOUS | Status: DC
  Administered 2020-09-16 – 2020-09-17 (×3): 0.25 ug/kg/min via INTRAVENOUS
  Administered 2020-09-18: 0.125 ug/kg/min via INTRAVENOUS
  Filled 2020-09-16 (×3): qty 100

## 2020-09-16 MED ORDER — ASPIRIN 81 MG PO CHEW
81.0000 mg | CHEWABLE_TABLET | Freq: Every day | ORAL | Status: DC
  Administered 2020-09-17 – 2020-09-19 (×3): 81 mg via ORAL
  Filled 2020-09-16 (×3): qty 1

## 2020-09-16 MED ORDER — MAGNESIUM SULFATE 2 GM/50ML IV SOLN
2.0000 g | Freq: Once | INTRAVENOUS | Status: AC
  Administered 2020-09-16: 2 g via INTRAVENOUS
  Filled 2020-09-16: qty 50

## 2020-09-16 MED ORDER — TICAGRELOR 90 MG PO TABS: 90.0000 mg | ORAL_TABLET | Freq: Two times a day (BID) | ORAL | Status: DC

## 2020-09-16 MED FILL — Morphine Sulfate Inj 4 MG/ML: INTRAMUSCULAR | Qty: 0.5 | Status: AC

## 2020-09-16 NOTE — Progress Notes (Addendum)
Patient ID: Jerry Kramer, male   DOB: 27-Jan-1956, 65 y.o.   MRN: [id number]     Advanced Heart Failure Rounding Note  PCP-Cardiologist: No primary care provider on file.   Subjective:    Co-ox 68% => 56%, milrinone 0.25 started overnight with drop in co-ox and lactate 5 => 2.5.  Creatinine up to 1.6 with diuresis.  This morning, he is awake on vent.  CXR does not show pulmonary edema.  CVP 8.  SBP 100s.   Echo: EF 25-30% with LAD territory WMAs and no mechanical MI complications   Objective:   Weight Range: 89.6 kg Body mass index is 30.94 kg/m.   Vital Signs:   Temp:  [97 F (36.1 C)-98.5 F (36.9 C)] 98.5 F (36.9 C) (03/22 0311) Pulse Rate:  [0-135] 88 (03/22 0700) Resp:  [0-40] 12 (03/22 0700) BP: (83-162)/(59-106) 111/78 (03/22 0700) SpO2:  [0 %-100 %] 97 % (03/22 0700) Arterial Line BP: (117-171)/(75-88) 171/87 (03/21 1325) FiO2 (%):  [40 %-100 %] 40 % (03/22 0727) Weight:  [89.6 kg] 89.6 kg (03/22 0311)    Weight change: Filed Weights   09/15/20 0607 09/16/20 0311  Weight: 83.9 kg 89.6 kg    Intake/Output:   Intake/Output Summary (Last 24 hours) at 09/16/2020 0739 Last data filed at 09/16/2020 0700 Gross per 24 hour  Intake 4502.36 ml  Output 2520 ml  Net 1982.36 ml      Physical Exam    General:  Awake on vent. HEENT: Normal Neck: Supple. JVP 8. Carotids 2+ bilat; no bruits. No lymphadenopathy or thyromegaly appreciated. Cor: PMI nondisplaced. Regular rate & rhythm. No rubs, gallops or murmurs. Lungs: Clear Abdomen: Soft, nontender, nondistended. No hepatosplenomegaly. No bruits or masses. Good bowel sounds. Extremities: No cyanosis, clubbing, rash, edema Neuro: Awake, follows commands.   Telemetry   NSR in 80s, no PVCs (personally reviewed)   Labs    CBC Recent Labs    09/15/20 2038 09/16/20 0325  WBC 13.5* 17.0*  HGB 12.2* 12.0*  HCT 38.7* 38.1*  MCV 84.7 84.7  PLT 207 409   Basic Metabolic Panel Recent Labs    09/15/20 2047  09/16/20 0325  NA 133* 134*  K 4.9 4.6  CL 103 104  CO2 20* 24  GLUCOSE 253* 151*  BUN 16 21  CREATININE 1.46* 1.63*  CALCIUM 8.1* 8.4*  MG 1.5*  --    Liver Function Tests Recent Labs    09/15/20 2047  AST 197*  ALT 172*  ALKPHOS 61  BILITOT 1.0  PROT 6.1*  ALBUMIN 3.7   No results for input(s): LIPASE, AMYLASE in the last 72 hours. Cardiac Enzymes No results for input(s): CKTOTAL, CKMB, CKMBINDEX, TROPONINI in the last 72 hours.  BNP: BNP (last 3 results) No results for input(s): BNP in the last 8760 hours.  ProBNP (last 3 results) No results for input(s): PROBNP in the last 8760 hours.   D-Dimer No results for input(s): DDIMER in the last 72 hours. Hemoglobin A1C Recent Labs    09/15/20 2038  HGBA1C 6.0*   Fasting Lipid Panel Recent Labs    09/16/20 0325  TRIG 74   Thyroid Function Tests No results for input(s): TSH, T4TOTAL, T3FREE, THYROIDAB in the last 72 hours.  Invalid input(s): FREET3  Other results:   Imaging    CARDIAC CATHETERIZATION  Result Date: 09/16/2020  There is moderate left ventricular systolic dysfunction.  LV end diastolic pressure is severely elevated.  The left ventricular ejection fraction is 35-45%  by visual estimate.  There is trivial (1+) mitral regurgitation.  There is no aortic valve stenosis.  2nd Mrg lesion is 70% stenosed.  Prox LAD to Mid LAD lesion is 100% stenosed with 100% stenosed side branch in 1st Diag.  Post intervention, there is a 0% residual stenosis.  Post intervention, the side branch was reduced to 30% residual stenosis.  A drug-eluting stent was successfully placed using a STENT RESOLUTE ONYX 3.5X30.  Balloon angioplasty was performed using a BALLOON SAPPHIRE [id number].  A drug-eluting stent was successfully placed using a STENT RESOLUTE ONYX 3.0X8.  Lat 1st Diag lesion is 100% stenosed.  Post intervention, there is a 20% residual stenosis.  Balloon angioplasty was performed using a BALLOON  SAPPHIRE 2.5X15.  1st Diag lesion is 100% stenosed.  Mid LAD lesion is 40% stenosed.  Post intervention, there is a 0% residual stenosis.  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 Successful DES PCI of the LAD across the diagonal branch with a resolute Onyx 3.5 mm x 30 mm overlapped distally with a 3.0 x 8 mm stent (overlap postdilated to 3.6 mm, proximal 3.5 stent postdilated to 4.0 mm) ? 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  Acute hypoxic respite failure secondary to acute pulmonary edema -> patient will be intubated upon arrival to CCU PLAN OF CARE: Admit to CCU.  The patient will be intubated by Pulmonary Critical Care Team for airway protection and Acute Hypoxic Respiratory Failure.  Continue IV diuresis overnight  If significant ectopy continues, will start IV amiodarone  IV nitroglycerin overnight for maintain blood pressure control and afterload reduction.  We will continue Kengreal infusion until able to take p.o -> at which time he can be transitioned to Metro Kung, MD   Portable Chest xray  Result Date: 09/16/2020 CLINICAL DATA:  Intubation. History of cardiac arrest. Hiatal hernia repair. EXAM: PORTABLE CHEST 1 VIEW COMPARISON:  09/15/2020.  CT chest 09/10/2020. FINDINGS: Endotracheal tube tip 2 cm above the carina. Left IJ line tip over cavoatrial junction. Borderline cardiomegaly. No pulmonary venous congestion. Low lung volumes with bibasilar atelectasis. No prominent pleural effusion. No pneumothorax. Gastric distention noted. Free intraperitoneal air cannot be excluded. This could be from recent  surgery. Clinical correlation suggested. Abdominal series can be obtained for further evaluation. IMPRESSION: 1. Endotracheal tube tip 2 cm above the carina. Left IJ line tip over cavoatrial junction. 2. Borderline cardiomegaly.  No pulmonary venous congestion. 3.  Low lung volumes with bibasilar atelectasis. 4. Gastric distention. Free intraperitoneal air cannot be excluded. This could be from recent surgery. Clinical correlation suggested. Abdominal series can be obtained for further evaluation. Critical Value/emergent results were called by telephone at the time of interpretation on 09/16/2020 at 6:19 am to nurse [facility], who verbally acknowledged these results. Electronically Signed   By: Marcello Moores  Register   On: 09/16/2020 06:22   DG CHEST PORT 1 VIEW  Result Date: 09/15/2020 CLINICAL DATA:  Central line placement EXAM: PORTABLE CHEST 1 VIEW COMPARISON:  September 12, 2020 FINDINGS: The heart size and mediastinal contours are mildly enlarged. ETT is 2 cm above the the carina. A left-sided central venous catheter seen  with the tip at the superior cavoatrial junction. Multifocal patchy airspace opacities are seen within both lungs. There is a small left pleural effusion present. IMPRESSION: ETT 2 cm above the carina Left-sided central venous catheter with the tip at the superior cavoatrial junction Multifocal airspace opacities which could be due to edema or infectious etiology Electronically Signed   By: Prudencio Pair M.D.   On: 09/15/2020 19:44   ECHOCARDIOGRAM COMPLETE  Result Date: 09/15/2020    ECHOCARDIOGRAM REPORT   Patient Name:   AUDREY ELLER Pixler Date of Exam: 09/15/2020 Medical Rec #:  [id number]    Height:       67.0 in Accession #:    [id number]   Weight:       185.0 lb Date of Birth:  02-27-1956    BSA:          1.956 m Patient Age:    23 years     BP:           162/87 mmHg Patient Gender: M            HR:           90 bpm. Exam Location:  Inpatient Procedure: 2D Echo STAT ECHO Indications:    cad of  native vessel  History:        Patient has no prior history of Echocardiogram examinations.                 Arrythmias:ectopy status post cath; Risk Factors:Hypertension                 and Dyslipidemia.  Sonographer:    Johny Chess Referring Phys: 27 Gutierrez  Sonographer Comments: Echo performed with patient supine and on artificial respirator. Image acquisition challenging due to patient behavioral factors. and Image acquisition challenging due to respiratory motion. IMPRESSIONS  1. Left ventricular ejection fraction, by estimation, is 25 to 30%. The left ventricle has severely decreased function. The left ventricle demonstrates regional wall motion abnormalities with mid to apical anterior, anteroseptal, and inferoseptal akinesis; the true apex and the apical lateral and apical inferior walls are akinetic. Left ventricular diastolic parameters are consistent with Grade II diastolic dysfunction (pseudonormalization).  2. Right ventricular systolic function is normal. The right ventricular size is normal. Tricuspid regurgitation signal is inadequate for assessing PA pressure.  3. The mitral valve is normal in structure. Trivial mitral valve regurgitation. No evidence of mitral stenosis.  4. The aortic valve is tricuspid. Aortic valve regurgitation is not visualized. Mild aortic valve sclerosis is present, with no evidence of aortic valve stenosis.  5. IVC not well-visualized. FINDINGS  Left Ventricle: Left ventricular ejection fraction, by estimation, is 25 to 30%. The left ventricle has severely decreased function. The left ventricle demonstrates regional wall motion abnormalities. The left ventricular internal cavity size was normal  in size. There is no left ventricular hypertrophy. Left ventricular diastolic parameters are consistent with Grade II diastolic dysfunction (pseudonormalization). Right Ventricle: The right ventricular size is normal. No increase in right ventricular wall thickness.  Right ventricular systolic function is normal. Tricuspid regurgitation signal is inadequate for assessing PA pressure. Left Atrium: Left atrial size was normal in size. Right Atrium: Right atrial size was normal in size. Pericardium: There is no evidence of pericardial effusion. Mitral Valve: The mitral valve is normal in structure. Trivial mitral valve regurgitation. No evidence of mitral valve stenosis. Tricuspid Valve: The tricuspid valve is normal in structure. Tricuspid valve regurgitation is not demonstrated.  Aortic Valve: The aortic valve is tricuspid. Aortic valve regurgitation is not visualized. Mild aortic valve sclerosis is present, with no evidence of aortic valve stenosis. Pulmonic Valve: The pulmonic valve was not well visualized. Pulmonic valve regurgitation is not visualized. Aorta: The aortic root is normal in size and structure. Venous: The inferior vena cava was not well visualized. IAS/Shunts: No atrial level shunt detected by color flow Doppler.  LEFT VENTRICLE PLAX 2D LVIDd:         4.60 cm LVIDs:         3.30 cm LV PW:         0.90 cm LV IVS:        1.10 cm LVOT diam:     2.00 cm LVOT Area:     3.14 cm  LV Volumes (MOD) LV vol d, MOD A2C: 78.3 ml LV vol d, MOD A4C: 84.9 ml LV vol s, MOD A2C: 47.5 ml LV vol s, MOD A4C: 51.8 ml LV SV MOD A2C:     30.8 ml LV SV MOD A4C:     84.9 ml LV SV MOD BP:      31.5 ml LEFT ATRIUM             Index       RIGHT ATRIUM           Index LA diam:        3.40 cm 1.74 cm/m  RA Area:     10.60 cm LA Vol (A2C):   45.2 ml 23.10 ml/m RA Volume:   19.80 ml  10.12 ml/m LA Vol (A4C):   44.3 ml 22.64 ml/m LA Biplane Vol: 49.3 ml 25.20 ml/m   AORTA Ao Root diam: 3.40 cm Ao Asc diam:  3.40 cm  SHUNTS Systemic Diam: 2.00 cm Loralie Champagne MD Electronically signed by Loralie Champagne MD Signature Date/Time: 09/15/2020/7:48:52 PM    Final       Medications:     Scheduled Medications: . amiodarone  150 mg Intravenous Once  . aspirin  81 mg Oral Daily  .  atorvastatin  80 mg Oral Daily  . chlorhexidine gluconate (MEDLINE KIT)  15 mL Mouth Rinse BID  . Chlorhexidine Gluconate Cloth  6 each Topical Daily  . enoxaparin (LOVENOX) injection  40 mg Subcutaneous Q24H  . furosemide  80 mg Intravenous BID  . insulin aspart  0-9 Units Subcutaneous Q4H  . mouth rinse  15 mL Mouth Rinse 10 times per day  . ondansetron (ZOFRAN) IV  4 mg Intravenous Q6H  . pantoprazole (PROTONIX) IV  40 mg Intravenous Daily  . sodium chloride flush  3 mL Intravenous Q12H  . ticagrelor  90 mg Oral BID     Infusions: . sodium chloride    . sodium chloride    . amiodarone 30 mg/hr (09/16/20 0700)  . cangrelor 50 mg in NS 250 mL 0.75 mcg/kg/min (09/16/20 0700)  . fentaNYL infusion INTRAVENOUS 75 mcg/hr (09/16/20 0700)  . milrinone 0.25 mcg/kg/min (09/16/20 0700)  . nitroGLYCERIN Stopped (09/16/20 0612)  . norepinephrine (LEVOPHED) Adult infusion Stopped (09/16/20 0051)  . propofol (DIPRIVAN) infusion 5 mcg/kg/min (09/16/20 0700)     PRN Medications:  sodium chloride, acetaminophen, bisacodyl, fentaNYL, fentaNYL (SUBLIMAZE) injection, fentaNYL (SUBLIMAZE) injection, midazolam, morphine injection, ondansetron (ZOFRAN) IV, sodium chloride flush     Assessment/Plan   1.  CAD: Post-op anterolateral STEMI.  No prior CAD but history of HTN and hyperlipidemia and strong FH of CAD.  He had occluded proximal LAD  and D1, now s/p DES to LAD and PTCA to D1.  - Continue ASA 81, Cangrelor IV until able to take Brilinta.  - Continue atorvastatin when able to take po meds.  2. Acute systolic CHF: Due to anterolateral MI. LVEDP elevated with flash pulmonary edema.  Echo with EF 25-30%, LAD territory WMAs with no evidence for mechanical MI complications. He was diuresed overnight, CVP 8 today with AKI, creatinine up to 0.8.  Early am co-ox 56%, so with elevated lactate, milrinone 0.25 started.  CXR this morning w/o pulmonary edema.  - Hold Lasix for now.   - Continue milrinone  0.25 and resend co-ox + lactate.  - Will need eventual Lifevest.  3. PVCs: Much decreased on amiodarone.  - Continue amiodarone.   4. Acute hypoxemic respiratory failure: Due to pulmonary edema.  Improved with clear CXR.  - Hopefully extubate today.  5. S/p robotic assisted laparoscopic paraesophageal hernia repair with fundoplication. - Post-op per TCTS.  6. AKI: Creatinine up to 1.6 this morning.  - Hold diuretic with CVP around 8.   CRITICAL CARE Performed by: Loralie Champagne  Total critical care time: 35 minutes  Critical care time was exclusive of separately billable procedures and treating other patients.  Critical care was necessary to treat or prevent imminent or life-threatening deterioration.  Critical care was time spent personally by me on the following activities: development of treatment plan with patient and/or surrogate as well as nursing, discussions with consultants, evaluation of patient's response to treatment, examination of patient, obtaining history from patient or surrogate, ordering and performing treatments and interventions, ordering and review of laboratory studies, ordering and review of radiographic studies, pulse oximetry and re-evaluation of patient's condition.    Length of Stay: 1  Loralie Champagne, MD  09/16/2020, 7:39 AM  Advanced Heart Failure Team Pager [phone] (M-F; 7a - 5p)  Please contact [facility] for night-coverage after hours (5p -7a ) and weekends on [email or web address]

## 2020-09-16 NOTE — Progress Notes (Signed)
Critical Lactic acid 5.0 reported to [facility] MD. Dr. Genevive Bi acknowledged result. Also critical troponin >27,000 expected result s/p cardiac cath.

## 2020-09-16 NOTE — Consult Note (Addendum)
NAME:  Jerry Kramer, MRN:  [id number], DOB:  December 13, 1955, LOS: 1 ADMISSION DATE:  09/15/2020, CONSULTATION DATE:  3/21 REFERRING MD:  Dr Kipp Brood, CHIEF COMPLAINT:  Dyspnea   History of Present Illness:  65 year old-year-old male with past medical history as below, which is significant for hypertension and hyperlipidemia.  Medical history is also significant for longstanding GERD which has been managed with proton pump inhibitors.  In the months leading up to admission his symptoms had worsened to include dysphagia.  He began following with gastroenterology in 2021 and underwent upper endoscopy with dilation in July of that year.  Work-up also significant for lower esophageal stricture and hiatal hernia. He presented 3/21 for hiatal hernia repair. The procedure itself was uncomplicated, however, post-operatively he developed chest pain. EKG confirmed STEMI and he was taken to the cath lab where DES was placed to LAD by Dr Ellyn Hack. After cardiac cath he continued to be short of breath despite 40mg  lasix. PCCM consulted for ongoing care.   Pertinent  Medical History   has a past medical history of Allergy, Colon polyps, Complication of anesthesia, CONTACT DERMATITIS (09/03/2009), ELEVATED BLOOD PRESSURE (06/19/2010), HYPERLIPIDEMIA (08/27/2009), and UNSPECIFIED OTALGIA (06/19/2010).   Significant Hospital Events: Including procedures, antibiotic start and stop dates in addition to other pertinent events   . 3/21 presented to elective hiatal hernia repair. STEMI post op. LAD stented in cath lab. Transfer to ICU on heated high flow.   Interim History / Subjective:   Intubated critically ill on mechanical life support, milrinone for cardiogenic shock and LV support.  Objective   Blood pressure 114/84, pulse 80, temperature 98.7 F (37.1 C), temperature source Oral, resp. rate 20, height 5\' 7"  (1.702 m), weight 89.6 kg, SpO2 98 %. CVP:  [8 mmHg-27 mmHg] 9 mmHg  Vent Mode: PCV FiO2 (%):  [40 %-100 %]  40 % Set Rate:  [18 bmp] 18 bmp Vt Set:  [530 mL] 530 mL PEEP:  [5 cmH20] 5 cmH20 Plateau Pressure:  [13 cmH20-17 cmH20] 13 cmH20   Intake/Output Summary (Last 24 hours) at 09/16/2020 4098 Last data filed at 09/16/2020 0800 Gross per 24 hour  Intake 4351.54 ml  Output 2420 ml  Net 1931.54 ml   Filed Weights   09/15/20 0607 09/16/20 0311  Weight: 83.9 kg 89.6 kg    Examination: General: Adult male on mechanical support HENT: NCAT, pupils reactive, tracking appropriately Lungs: Bilateral mechanically ventilated breath sounds Cardiovascular: Regular rhythm, tachycardic, S1-S2 Abdomen: Soft, nontender nondistended Extremities: No acute deformity no edema Neuro: Alert oriented following commands  Labs/imaging that I have personally reviewed   CT chest 3/22 > moderate size hiatal hernia and mild circumferential esophageal thickening.   Resolved Hospital Problem list     Assessment & Plan:   Acute hypoxemic respiratory failure Acute pulmonary edema Cardiogenic shock Acute systolic heart failure Plan: SBT SAT today Hopeful plan for extubation. Chest x-ray improved with no pulmonary edema Milrinone and continue diuresis per heart failure service  STEMI, ACS, LAD occlusion - s/p PCI with DES. -Continue Cangrelor -Once tolerating p.o. can switch to Brilinta  Hiatal hernia with Schatzki Ring c/b dysphagia - s/p esophagoscopy, robotic assisted laparoscopy, paraesophageal hernia repair with A-cell mash, Dor fundoplication. -Per T CTS -N.p.o.  Hyperglycemia -SSI plus CBGs -Goal CBG 140-180  Hx HTN, HLD No home medications on chart -Supportive care  Urinary retention In and out with some resistance Plan for coud catheter placement  Best practice (evaluated daily)  Diet:  NPO Pain/Anxiety/Delirium protocol (  if indicated): Yes (RASS goal 0) VAP protocol (if indicated): Yes DVT prophylaxis: per primary GI prophylaxis: PPI Glucose control:  SSI Yes Central venous  access:  N/A Arterial line:  N/A Foley:  Yes, and it is still needed Mobility:  bed rest  PT consulted: N/A Last date of multidisciplinary goals of care discussion [3/21] Code Status:  full code Disposition: ICU  Labs   CBC: Recent Labs  Lab 09/12/20 1523 09/15/20 0947 09/15/20 1542 09/15/20 1615 09/15/20 1617 09/15/20 2026 09/15/20 2038 09/16/20 0325  WBC 5.0  --  15.4*  --   --   --  13.5* 17.0*  HGB 13.3   < > 13.3 13.6 13.6 12.9* 12.2* 12.0*  HCT 40.6   < > 43.8 40.0 40.0 38.0* 38.7* 38.1*  MCV 81.7  --  86.2  --   --   --  84.7 84.7  PLT 198  --  227  --   --   --  207 216   < > = values in this interval not displayed.    Basic Metabolic Panel: Recent Labs  Lab 09/12/20 1523 09/15/20 0947 09/15/20 1615 09/15/20 1617 09/15/20 2026 09/15/20 2047 09/16/20 0325  NA 137   < > 139 138 135 133* 134*  K 3.8   < > 3.8 3.8 4.6 4.9 4.6  CL 110  --   --  105  --  103 104  CO2 20*  --   --   --   --  20* 24  GLUCOSE 147*  --   --  205*  --  253* 151*  BUN 14  --   --  14  --  16 21  CREATININE 1.02  --   --  0.70  --  1.46* 1.63*  CALCIUM 9.1  --   --   --   --  8.1* 8.4*  MG  --   --   --   --   --  1.5*  --    < > = values in this interval not displayed.   GFR: Estimated Creatinine Clearance: 49.5 mL/min (A) (by C-G formula based on SCr of 1.63 mg/dL (H)). Recent Labs  Lab 09/12/20 1523 09/15/20 1542 09/15/20 2038 09/15/20 2058 09/16/20 0044 09/16/20 0325 09/16/20 0756  WBC 5.0 15.4* 13.5*  --   --  17.0*  --   LATICACIDVEN  --   --   --  5.0* 2.5*  --  1.4    Liver Function Tests: Recent Labs  Lab 09/12/20 1523 09/15/20 2047  AST 26 197*  ALT 22 172*  ALKPHOS 72 61  BILITOT 0.7 1.0  PROT 6.5 6.1*  ALBUMIN 4.1 3.7   No results for input(s): LIPASE, AMYLASE in the last 168 hours. No results for input(s): AMMONIA in the last 168 hours.  ABG    Component Value Date/Time   PHART 7.296 (L) 09/15/2020 2026   PCO2ART 38.9 09/15/2020 2026    PO2ART 266 (H) 09/15/2020 2026   HCO3 19.0 (L) 09/15/2020 2026   TCO2 20 (L) 09/15/2020 2026   ACIDBASEDEF 7.0 (H) 09/15/2020 2026   O2SAT 60.0 09/16/2020 0756     Coagulation Profile: Recent Labs  Lab 09/12/20 1523  INR 1.1    Cardiac Enzymes: No results for input(s): CKTOTAL, CKMB, CKMBINDEX, TROPONINI in the last 168 hours.  HbA1C: Hgb A1c MFr Bld  Date/Time Value Ref Range Status  09/15/2020 08:38 PM 6.0 (H) 4.8 - 5.6 % Final  Comment:    (NOTE) Pre diabetes:          5.7%-6.4%  Diabetes:              >6.4%  Glycemic control for   <7.0% adults with diabetes     CBG: Recent Labs  Lab 09/15/20 2028 09/16/20 0042 09/16/20 0324 09/16/20 0813  GLUCAP 226* 171* 145* 140*    This patient is critically ill with multiple organ system failure; which, requires frequent high complexity decision making, assessment, support, evaluation, and titration of therapies. This was completed through the application of advanced monitoring technologies and extensive interpretation of multiple databases. During this encounter critical care time was devoted to patient care services described in this note for 35 minutes.  Garner Nash, DO [facility] 09/16/2020 9:08 AM

## 2020-09-16 NOTE — Procedures (Signed)
Extubation Procedure Note  Patient Details:   Name: Jerry Kramer DOB: 07-29-1955 MRN: [id number]   Airway Documentation:    Vent end date: 09/16/20 Vent end time: 1005   Evaluation  O2 sats: stable throughout Complications: No apparent complications Patient did tolerate procedure well. Bilateral Breath Sounds: Clear,Diminished   Yes   No stridor noted.  Iridiana Fonner 09/16/2020, 10:11 AM

## 2020-09-16 NOTE — Progress Notes (Addendum)
[facility] DAILY ICU PROGRESS NOTE                   [address]            [place] [address]          [phone]   1 Day Post-Op Procedure(s) (LRB): LEFT HEART CATH AND CORONARY ANGIOGRAPHY (N/A) Coronary/Graft Acute MI Revascularization (N/A)  Total Length of Stay:  LOS: 1 day   Subjective:  events reviewed, appreciate CCM/cardiology  Objective: Vital signs in last 24 hours: Temp:  [97 F (36.1 C)-98.5 F (36.9 C)] 98.5 F (36.9 C) (03/22 0311) Pulse Rate:  [0-135] 88 (03/22 0700) Cardiac Rhythm: Normal sinus rhythm (03/22 0400) Resp:  [0-40] 12 (03/22 0700) BP: (83-162)/(59-106) 111/78 (03/22 0700) SpO2:  [0 %-100 %] 97 % (03/22 0700) Arterial Line BP: (117-171)/(75-88) 171/87 (03/21 1325) FiO2 (%):  [40 %-100 %] 40 % (03/22 0727) Weight:  [89.6 kg] 89.6 kg (03/22 0311)  Filed Weights   09/15/20 0607 09/16/20 0311  Weight: 83.9 kg 89.6 kg    Weight change: 5.684 kg   Hemodynamic parameters for last 24 hours: CVP:  [8 mmHg-27 mmHg] 20 mmHg  Intake/Output from previous day: 03/21 0701 - 03/22 0700 In: 4502.4 [I.V.:3702.3; IV Piggyback:800.1] Out: 2520 [Urine:2120; Blood:400]  Vent Mode: PCV FiO2 (%):  [40 %-100 %] 40 % Set Rate:  [18 bmp] 18 bmp Vt Set:  [530 mL] 530 mL PEEP:  [5 cmH20] 5 cmH20 Plateau Pressure:  [13 cmH20-17 cmH20] 13 cmH20    Intake/Output this shift: No intake/output data recorded.      Current Meds: Scheduled Meds: . amiodarone  150 mg Intravenous Once  . aspirin  81 mg Oral Daily  . atorvastatin  80 mg Oral Daily  . chlorhexidine gluconate (MEDLINE KIT)  15 mL Mouth Rinse BID  . Chlorhexidine Gluconate Cloth  6 each Topical Daily  . enoxaparin (LOVENOX) injection  40 mg Subcutaneous Q24H  . furosemide  80 mg Intravenous BID  . insulin aspart  0-9 Units Subcutaneous Q4H  . mouth rinse  15 mL Mouth Rinse 10 times per day  . ondansetron (ZOFRAN) IV  4 mg Intravenous Q6H  . pantoprazole (PROTONIX) IV  40 mg  Intravenous Daily  . sodium chloride flush  3 mL Intravenous Q12H  . ticagrelor  90 mg Oral BID   Continuous Infusions: . sodium chloride    . sodium chloride    . amiodarone 30 mg/hr (09/16/20 0700)  . cangrelor 50 mg in NS 250 mL 0.75 mcg/kg/min (09/16/20 0700)  . fentaNYL infusion INTRAVENOUS 75 mcg/hr (09/16/20 0700)  . milrinone 0.25 mcg/kg/min (09/16/20 0700)  . nitroGLYCERIN Stopped (09/16/20 0612)  . norepinephrine (LEVOPHED) Adult infusion Stopped (09/16/20 0051)  . propofol (DIPRIVAN) infusion 5 mcg/kg/min (09/16/20 0700)   PRN Meds:.sodium chloride, acetaminophen, bisacodyl, fentaNYL, fentaNYL (SUBLIMAZE) injection, fentaNYL (SUBLIMAZE) injection, midazolam, morphine injection, ondansetron (ZOFRAN) IV, sodium chloride flush  General appearance: alert, cooperative and no distress Neurologic: intact Heart: regular rate and rhythm Lungs: min dim in lower fields Abdomen: some distension, + BS Extremities: PAS in place Wound: incis healing well  Lab Results: CBC: Recent Labs    09/15/20 2038 09/16/20 0325  WBC 13.5* 17.0*  HGB 12.2* 12.0*  HCT 38.7* 38.1*  PLT 207 216   BMET:  Recent Labs    09/15/20 2047 09/16/20 0325  NA 133* 134*  K 4.9 4.6  CL 103 104  CO2 20* 24  GLUCOSE  253* 151*  BUN 16 21  CREATININE 1.46* 1.63*  CALCIUM 8.1* 8.4*    CMET: Lab Results  Component Value Date   WBC 17.0 (H) 09/16/2020   HGB 12.0 (L) 09/16/2020   HCT 38.1 (L) 09/16/2020   PLT 216 09/16/2020   GLUCOSE 151 (H) 09/16/2020   CHOL 156 11/09/2017   TRIG 74 09/16/2020   HDL 39.90 11/09/2017   LDLCALC 96 11/09/2017   ALT 172 (H) 09/15/2020   AST 197 (H) 09/15/2020   NA 134 (L) 09/16/2020   K 4.6 09/16/2020   CL 104 09/16/2020   CREATININE 1.63 (H) 09/16/2020   BUN 21 09/16/2020   CO2 24 09/16/2020   TSH 1.45 11/09/2017   PSA 1.08 11/09/2017   INR 1.1 09/12/2020   HGBA1C 6.0 (H) 09/15/2020   ABG    Component Value Date/Time   PHART 7.296 (L) 09/15/2020  2026   PCO2ART 38.9 09/15/2020 2026   PO2ART 266 (H) 09/15/2020 2026   HCO3 19.0 (L) 09/15/2020 2026   TCO2 20 (L) 09/15/2020 2026   ACIDBASEDEF 7.0 (H) 09/15/2020 2026   O2SAT 56.1 09/16/2020 0325     PT/INR: No results for input(s): LABPROT, INR in the last 72 hours. Radiology: CARDIAC CATHETERIZATION  Result Date: 09/16/2020  There is moderate left ventricular systolic dysfunction.  LV end diastolic pressure is severely elevated.  The left ventricular ejection fraction is 35-45% by visual estimate.  There is trivial (1+) mitral regurgitation.  There is no aortic valve stenosis.  2nd Mrg lesion is 70% stenosed.  Prox LAD to Mid LAD lesion is 100% stenosed with 100% stenosed side branch in 1st Diag.  Post intervention, there is a 0% residual stenosis.  Post intervention, the side branch was reduced to 30% residual stenosis.  A drug-eluting stent was successfully placed using a STENT RESOLUTE ONYX 3.5X30.  Balloon angioplasty was performed using a BALLOON SAPPHIRE [id number].  A drug-eluting stent was successfully placed using a STENT RESOLUTE ONYX 3.0X8.  Lat 1st Diag lesion is 100% stenosed.  Post intervention, there is a 20% residual stenosis.  Balloon angioplasty was performed using a BALLOON SAPPHIRE 2.5X15.  1st Diag lesion is 100% stenosed.  Mid LAD lesion is 40% stenosed.  Post intervention, there is a 0% residual stenosis.  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 Successful DES PCI of the LAD across the diagonal branch with a resolute Onyx 3.5 mm x 30 mm overlapped distally with a 3.0 x 8 mm stent (overlap postdilated to 3.6 mm, proximal 3.5 stent postdilated to 4.0 mm) ? PTCA of side branch of D1 restoring TIMI II flow in the sidebranch and TIMI I flow with distal occlusion of the major branch of D1  Acute Combined  Systolic and Diastolic Diastolic Heart Failure with LVEDP of 25 mmHg, LVEF ~35% with anterior hypokinesis.  Acute hypoxic respite failure secondary to acute pulmonary edema -> patient will be intubated upon arrival to CCU PLAN OF CARE: Admit to CCU.  The patient will be intubated by Pulmonary Critical Care Team for airway protection and Acute Hypoxic Respiratory Failure.  Continue IV diuresis overnight  If significant ectopy continues, will start IV amiodarone  IV nitroglycerin overnight for maintain blood pressure control and afterload reduction.  We will continue Kengreal infusion until able to take p.o -> at which time he can be transitioned to [facility],  MD   Portable Chest xray  Result Date: 09/16/2020 CLINICAL DATA:  Intubation. History of cardiac arrest. Hiatal hernia repair. EXAM: PORTABLE CHEST 1 VIEW COMPARISON:  09/15/2020.  CT chest 09/10/2020. FINDINGS: Endotracheal tube tip 2 cm above the carina. Left IJ line tip over cavoatrial junction. Borderline cardiomegaly. No pulmonary venous congestion. Low lung volumes with bibasilar atelectasis. No prominent pleural effusion. No pneumothorax. Gastric distention noted. Free intraperitoneal air cannot be excluded. This could be from recent surgery. Clinical correlation suggested. Abdominal series can be obtained for further evaluation. IMPRESSION: 1. Endotracheal tube tip 2 cm above the carina. Left IJ line tip over cavoatrial junction. 2. Borderline cardiomegaly.  No pulmonary venous congestion. 3.  Low lung volumes with bibasilar atelectasis. 4. Gastric distention. Free intraperitoneal air cannot be excluded. This could be from recent surgery. Clinical correlation suggested. Abdominal series can be obtained for further evaluation. Critical Value/emergent results were called by telephone at the time of interpretation on 09/16/2020 at 6:19 am to nurse [facility], who verbally acknowledged these results. Electronically Signed   By: Marcello Moores   Register   On: 09/16/2020 06:22   DG CHEST PORT 1 VIEW  Result Date: 09/15/2020 CLINICAL DATA:  Central line placement EXAM: PORTABLE CHEST 1 VIEW COMPARISON:  September 12, 2020 FINDINGS: The heart size and mediastinal contours are mildly enlarged. ETT is 2 cm above the the carina. A left-sided central venous catheter seen with the tip at the superior cavoatrial junction. Multifocal patchy airspace opacities are seen within both lungs. There is a small left pleural effusion present. IMPRESSION: ETT 2 cm above the carina Left-sided central venous catheter with the tip at the superior cavoatrial junction Multifocal airspace opacities which could be due to edema or infectious etiology Electronically Signed   By: Prudencio Pair M.D.   On: 09/15/2020 19:44   ECHOCARDIOGRAM COMPLETE  Result Date: 09/15/2020    ECHOCARDIOGRAM REPORT   Patient Name:   GEOVONNI MEYERHOFF Arko Date of Exam: 09/15/2020 Medical Rec #:  [id number]    Height:       67.0 in Accession #:    [id number]   Weight:       185.0 lb Date of Birth:  12-31-1955    BSA:          1.956 m Patient Age:    42 years     BP:           162/87 mmHg Patient Gender: M            HR:           90 bpm. Exam Location:  Inpatient Procedure: 2D Echo STAT ECHO Indications:    cad of native vessel  History:        Patient has no prior history of Echocardiogram examinations.                 Arrythmias:ectopy status post cath; Risk Factors:Hypertension                 and Dyslipidemia.  Sonographer:    Johny Chess Referring Phys: 38 [place]  Sonographer Comments: Echo performed with patient supine and on artificial respirator. Image acquisition challenging due to patient behavioral factors. and Image acquisition challenging due to respiratory motion. IMPRESSIONS  1. Left ventricular ejection fraction, by estimation, is 25 to 30%. The left ventricle has severely decreased function. The left ventricle demonstrates regional wall motion abnormalities with mid to apical  anterior, anteroseptal, and inferoseptal akinesis; the true apex and  the apical lateral and apical inferior walls are akinetic. Left ventricular diastolic parameters are consistent with Grade II diastolic dysfunction (pseudonormalization).  2. Right ventricular systolic function is normal. The right ventricular size is normal. Tricuspid regurgitation signal is inadequate for assessing PA pressure.  3. The mitral valve is normal in structure. Trivial mitral valve regurgitation. No evidence of mitral stenosis.  4. The aortic valve is tricuspid. Aortic valve regurgitation is not visualized. Mild aortic valve sclerosis is present, with no evidence of aortic valve stenosis.  5. IVC not well-visualized. FINDINGS  Left Ventricle: Left ventricular ejection fraction, by estimation, is 25 to 30%. The left ventricle has severely decreased function. The left ventricle demonstrates regional wall motion abnormalities. The left ventricular internal cavity size was normal  in size. There is no left ventricular hypertrophy. Left ventricular diastolic parameters are consistent with Grade II diastolic dysfunction (pseudonormalization). Right Ventricle: The right ventricular size is normal. No increase in right ventricular wall thickness. Right ventricular systolic function is normal. Tricuspid regurgitation signal is inadequate for assessing PA pressure. Left Atrium: Left atrial size was normal in size. Right Atrium: Right atrial size was normal in size. Pericardium: There is no evidence of pericardial effusion. Mitral Valve: The mitral valve is normal in structure. Trivial mitral valve regurgitation. No evidence of mitral valve stenosis. Tricuspid Valve: The tricuspid valve is normal in structure. Tricuspid valve regurgitation is not demonstrated. Aortic Valve: The aortic valve is tricuspid. Aortic valve regurgitation is not visualized. Mild aortic valve sclerosis is present, with no evidence of aortic valve stenosis. Pulmonic Valve:  The pulmonic valve was not well visualized. Pulmonic valve regurgitation is not visualized. Aorta: The aortic root is normal in size and structure. Venous: The inferior vena cava was not well visualized. IAS/Shunts: No atrial level shunt detected by color flow Doppler.  LEFT VENTRICLE PLAX 2D LVIDd:         4.60 cm LVIDs:         3.30 cm LV PW:         0.90 cm LV IVS:        1.10 cm LVOT diam:     2.00 cm LVOT Area:     3.14 cm  LV Volumes (MOD) LV vol d, MOD A2C: 78.3 ml LV vol d, MOD A4C: 84.9 ml LV vol s, MOD A2C: 47.5 ml LV vol s, MOD A4C: 51.8 ml LV SV MOD A2C:     30.8 ml LV SV MOD A4C:     84.9 ml LV SV MOD BP:      31.5 ml LEFT ATRIUM             Index       RIGHT ATRIUM           Index LA diam:        3.40 cm 1.74 cm/m  RA Area:     10.60 cm LA Vol (A2C):   45.2 ml 23.10 ml/m RA Volume:   19.80 ml  10.12 ml/m LA Vol (A4C):   44.3 ml 22.64 ml/m LA Biplane Vol: 49.3 ml 25.20 ml/m   AORTA Ao Root diam: 3.40 cm Ao Asc diam:  3.40 cm  SHUNTS Systemic Diam: 2.00 cm Loralie Champagne MD Electronically signed by Loralie Champagne MD Signature Date/Time: 09/15/2020/7:48:52 PM    Final      Assessment/Plan: S/P Procedure(s) (LRB): LEFT HEART CATH AND CORONARY ANGIOGRAPHY (N/A) Coronary/Graft Acute MI Revascularization (N/A)  1 afeb, BP control mostly good with some low relative  hypotensive readings, sinus with PVC's on monitor 2 co ox 56, Trop I >27, 000 3 gtts- levo/nitro is off, on milrinone, amio, , propofol and fentanyl 4 cangrelor for PCI antiplatelet 5 very minor ABLA , did receive 1 unit PRBC's intra op 6 leukocytosis- inflammatory response- monitor 7 creat trending higher, good UOP, poss dye response. K+ ok, Mg++ received replacement 8 CXR  With some atx, volume loss, stomach distension 9 hoping to extubate soon 10 swallow study prior to feeding     [name] PA-C Pager [phone] 09/16/2020 7:35 AM   Overall doing well On some milr. satting well. Large gastric bubble on CXR.   Hold off on NG tube for now Will need to sit up and mobilize [facility] for crushed PO medication once extubated Will obtain swallow prior to discharge  [place]

## 2020-09-16 NOTE — Anesthesia Postprocedure Evaluation (Signed)
Anesthesia Post Note  Patient: Romar Woodrick Curl  Procedure(s) Performed: XI ROBOTIC ASSISTED HIATAL HERNIA REPAIR WITH MESH (N/A Chest) ESOPHAGOGASTRODUODENOSCOPY (EGD) (N/A ) XI ROBOTIC ASSISTED LAPAROSCOPIC FUNDOPLICATION (Abdomen)     Patient location during evaluation: PACU Anesthesia Type: General Level of consciousness: awake and alert Pain management: pain level controlled Vital Signs Assessment: post-procedure vital signs reviewed and stable Respiratory status: spontaneous breathing, nonlabored ventilation, respiratory function stable and patient connected to nasal cannula oxygen Cardiovascular status: blood pressure returned to baseline, stable and unstable Anesthetic complications: yes Comments: Approximately 3 hours post-op, patient began to complain of chest pain. EKG on monitor showed new ventricular bigeminy. 12 lead EKG obtained which showed ST elevations I, aVL as well as V4 and V5. Cardiology consult obtained after discussion with Dr. Kipp Brood (surgeon), and patient taken emergently to Cath Lab for catheterization with Dr Ellyn Hack.   No complications documented.  Last Vitals:  Vitals:   09/16/20 0800 09/16/20 0811  BP: 114/84   Pulse: 80   Resp: 20   Temp:  37.1 C  SpO2: 98%     Last Pain:  Vitals:   09/16/20 0811  TempSrc: Oral  PainSc:                  Catalina Gravel

## 2020-09-16 NOTE — Progress Notes (Signed)
AM labs reported to Dr. Blossom Hoops. New orders received. See MAR.

## 2020-09-17 DIAGNOSIS — I2102 ST elevation (STEMI) myocardial infarction involving left anterior descending coronary artery: Secondary | ICD-10-CM | POA: Diagnosis not present

## 2020-09-17 DIAGNOSIS — I5021 Acute systolic (congestive) heart failure: Secondary | ICD-10-CM | POA: Diagnosis not present

## 2020-09-17 LAB — GLUCOSE, CAPILLARY
Glucose-Capillary: 106 mg/dL — ABNORMAL HIGH (ref 70–99)
Glucose-Capillary: 109 mg/dL — ABNORMAL HIGH (ref 70–99)
Glucose-Capillary: 119 mg/dL — ABNORMAL HIGH (ref 70–99)
Glucose-Capillary: 134 mg/dL — ABNORMAL HIGH (ref 70–99)
Glucose-Capillary: 135 mg/dL — ABNORMAL HIGH (ref 70–99)
Glucose-Capillary: 98 mg/dL (ref 70–99)

## 2020-09-17 LAB — COOXEMETRY PANEL
Carboxyhemoglobin: 1.3 % (ref 0.5–1.5)
Carboxyhemoglobin: 1.2 % (ref 0.5–1.5)
Methemoglobin: 1.2 % (ref 0.0–1.5)
Methemoglobin: 0.9 % (ref 0.0–1.5)
O2 Saturation: 62.3 %
O2 Saturation: 52.1 %
Total hemoglobin: 10.9 g/dL — ABNORMAL LOW (ref 12.0–16.0)
Total hemoglobin: 11.6 g/dL — ABNORMAL LOW (ref 12.0–16.0)

## 2020-09-17 LAB — BASIC METABOLIC PANEL
Anion gap: 7 (ref 5–15)
BUN: 17 mg/dL (ref 8–23)
CO2: 24 mmol/L (ref 22–32)
Calcium: 8.7 mg/dL — ABNORMAL LOW (ref 8.9–10.3)
Chloride: 102 mmol/L (ref 98–111)
Creatinine, Ser: 1.13 mg/dL (ref 0.61–1.24)
GFR, Estimated: >60 mL/min (ref >60)
Glucose, Bld: 139 mg/dL — ABNORMAL HIGH (ref 70–99)
Potassium: 3.9 mmol/L (ref 3.5–5.1)
Sodium: 133 mmol/L — ABNORMAL LOW (ref 135–145)

## 2020-09-17 LAB — CBC
HCT: 33.1 % — ABNORMAL LOW (ref 39.0–52.0)
Hemoglobin: 10.8 g/dL — ABNORMAL LOW (ref 13.0–17.0)
MCH: 27.3 pg (ref 26.0–34.0)
MCHC: 32.6 g/dL (ref 30.0–36.0)
MCV: 83.6 fL (ref 80.0–100.0)
Platelets: 156 10*3/uL (ref 150–400)
RBC: 3.96 MIL/uL — ABNORMAL LOW (ref 4.22–5.81)
RDW: 16.5 % — ABNORMAL HIGH (ref 11.5–15.5)
WBC: 14.6 10*3/uL — ABNORMAL HIGH (ref 4.0–10.5)
nRBC: 0 % (ref 0.0–0.2)

## 2020-09-17 LAB — MAGNESIUM: Magnesium: 2.2 mg/dL (ref 1.7–2.4)

## 2020-09-17 MED ORDER — SACUBITRIL-VALSARTAN 24-26 MG PO TABS
1.0000 | ORAL_TABLET | Freq: Two times a day (BID) | ORAL | Status: DC
  Administered 2020-09-17 – 2020-09-19 (×5): 1 via ORAL
  Filled 2020-09-17 (×5): qty 1

## 2020-09-17 MED ORDER — POTASSIUM CHLORIDE 10 MEQ/50ML IV SOLN
10.0000 meq | INTRAVENOUS | Status: AC
  Administered 2020-09-17 (×3): 10 meq via INTRAVENOUS
  Filled 2020-09-17 (×3): qty 50

## 2020-09-17 NOTE — Progress Notes (Signed)
PCCM:  PCCM will sign off at this time. We appreciate the opportunity to care for this patient. Please call us if needed.   Garner Nash, DO [facility] 09/17/2020 9:53 AM

## 2020-09-17 NOTE — Progress Notes (Addendum)
Patient ID: Jerry Kramer, male   DOB: 1956/05/31, 65 y.o.   MRN: [id number]     Advanced Heart Failure Rounding Note  PCP-Cardiologist: No primary care provider on file.   Subjective:    No complaints this morning, co-ox 62% with CVP 6.  I/Os negative. Taking crushed pills.   Echo: EF 25-30% with LAD territory WMAs and no mechanical MI complications   Objective:   Weight Range: 84.7 kg Body mass index is 29.25 kg/m.   Vital Signs:   Temp:  [98.1 F (36.7 C)-99.7 F (37.6 C)] 99.7 F (37.6 C) (03/22 2330) Pulse Rate:  [39-112] 95 (03/23 0700) Resp:  [15-30] 21 (03/23 0700) BP: (114-136)/(74-95) 131/88 (03/23 0700) SpO2:  [96 %-100 %] 97 % (03/23 0700) FiO2 (%):  [40 %] 40 % (03/22 0800) Weight:  [84.7 kg] 84.7 kg (03/23 0500)    Weight change: Filed Weights   09/15/20 0607 09/16/20 0311 09/17/20 0500  Weight: 83.9 kg 89.6 kg 84.7 kg    Intake/Output:   Intake/Output Summary (Last 24 hours) at 09/17/2020 0740 Last data filed at 09/17/2020 0700 Gross per 24 hour  Intake 873.99 ml  Output 2651 ml  Net -1777.01 ml      Physical Exam    General: NAD Neck: No JVD, no thyromegaly or thyroid nodule.  Lungs: Clear to auscultation bilaterally with normal respiratory effort. CV: Nondisplaced PMI.  Heart regular S1/S2, no S3/S4, no murmur.  No peripheral edema.   Abdomen: Soft, nontender, no hepatosplenomegaly, no distention.  Skin: Intact without lesions or rashes.  Neurologic: Alert and oriented x 3.  Psych: Normal affect. Extremities: No clubbing or cyanosis.  HEENT: Normal.    Telemetry   NSR in 80s, no PVCs (personally reviewed)   Labs    CBC Recent Labs    09/16/20 0325 09/17/20 0306  WBC 17.0* 14.6*  HGB 12.0* 10.8*  HCT 38.1* 33.1*  MCV 84.7 83.6  PLT 216 409   Basic Metabolic Panel Recent Labs    09/15/20 2047 09/16/20 0325 09/17/20 0306  NA 133* 134* 133*  K 4.9 4.6 3.9  CL 103 104 102  CO2 20* 24 24  GLUCOSE 253* 151* 139*  BUN 16  21 17   CREATININE 1.46* 1.63* 1.13  CALCIUM 8.1* 8.4* 8.7*  MG 1.5*  --  2.2   Liver Function Tests Recent Labs    09/15/20 2047  AST 197*  ALT 172*  ALKPHOS 61  BILITOT 1.0  PROT 6.1*  ALBUMIN 3.7   No results for input(s): LIPASE, AMYLASE in the last 72 hours. Cardiac Enzymes No results for input(s): CKTOTAL, CKMB, CKMBINDEX, TROPONINI in the last 72 hours.  BNP: BNP (last 3 results) No results for input(s): BNP in the last 8760 hours.  ProBNP (last 3 results) No results for input(s): PROBNP in the last 8760 hours.   D-Dimer No results for input(s): DDIMER in the last 72 hours. Hemoglobin A1C Recent Labs    09/15/20 2038  HGBA1C 6.0*   Fasting Lipid Panel Recent Labs    09/16/20 0325  TRIG 74   Thyroid Function Tests No results for input(s): TSH, T4TOTAL, T3FREE, THYROIDAB in the last 72 hours.  Invalid input(s): FREET3  Other results:   Imaging    No results found.   Medications:     Scheduled Medications: . amiodarone  150 mg Intravenous Once  . aspirin  81 mg Oral Daily  . atorvastatin  80 mg Oral Daily  . Chlorhexidine Gluconate Cloth  6 each Topical Daily  . digoxin  0.125 mg Oral Daily  . insulin aspart  0-9 Units Subcutaneous Q4H  . mouth rinse  15 mL Mouth Rinse BID  . ondansetron (ZOFRAN) IV  4 mg Intravenous Q6H  . pantoprazole (PROTONIX) IV  40 mg Intravenous Daily  . sodium chloride flush  10-40 mL Intracatheter Q12H  . sodium chloride flush  3 mL Intravenous Q12H  . spironolactone  12.5 mg Oral Daily  . ticagrelor  90 mg Oral BID    Infusions: . sodium chloride    . sodium chloride 10 mL/hr at 09/17/20 0434  . amiodarone 30 mg/hr (09/17/20 0430)  . milrinone 0.25 mcg/kg/min (09/17/20 0700)  . nitroGLYCERIN Stopped (09/16/20 0612)  . norepinephrine (LEVOPHED) Adult infusion Stopped (09/16/20 0051)  . potassium chloride 50 mL/hr at 09/17/20 0700    PRN Medications: sodium chloride, acetaminophen, bisacodyl, morphine  injection, ondansetron (ZOFRAN) IV, sodium chloride flush, sodium chloride flush     Assessment/Plan   1.  CAD: Post-op anterolateral STEMI.  No prior CAD but history of HTN and hyperlipidemia and strong FH of CAD.  He had occluded proximal LAD and D1, now s/p DES to LAD and PTCA to D1.  - Continue ASA 81, Brilinta.  - Continue atorvastatin.  2. Acute systolic CHF: Due to anterolateral MI. LVEDP elevated with flash pulmonary edema.  Echo with EF 25-30%, LAD territory WMAs with no evidence for mechanical MI complications. CVP 6 today with co-ox 62%, on milrinone 0.25.   - No Lasix needed.   - Decrease milrinone to 0.125.  - Will need eventual Lifevest.  - Continue spironolactone and digoxin.  - Add Entresto 24/26 bid.  3. PVCs: Much decreased on amiodarone.  - Continue amiodarone while on milrinone then stop.   4. Acute hypoxemic respiratory failure: Due to pulmonary edema.  Improved with clear CXR.  - Resolved/extubated.  5. S/p robotic assisted laparoscopic paraesophageal hernia repair with fundoplication. - Post-op per TCTS.  - Swallow study today.  6. AKI: Resolved, creatinine down to 1.1.    Mobilize, ok for step-down from my standpoint.   CRITICAL CARE Performed by: Loralie Champagne  Total critical care time: 35 minutes  Critical care time was exclusive of separately billable procedures and treating other patients.  Critical care was necessary to treat or prevent imminent or life-threatening deterioration.  Critical care was time spent personally by me on the following activities: development of treatment plan with patient and/or surrogate as well as nursing, discussions with consultants, evaluation of patient's response to treatment, examination of patient, obtaining history from patient or surrogate, ordering and performing treatments and interventions, ordering and review of laboratory studies, ordering and review of radiographic studies, pulse oximetry and re-evaluation of  patient's condition.    Length of Stay: 2  Loralie Champagne, MD  09/17/2020, 7:40 AM  Advanced Heart Failure Team Pager [phone] (M-F; 7a - 5p)  Please contact [facility] for night-coverage after hours (5p -7a ) and weekends on [email or web address]

## 2020-09-17 NOTE — Progress Notes (Signed)
Pt just back from walking with RN on 1L O2, no RW. To recliner. Desat on RA to 86 therefore reapplied 1L. Will return to ambulate again after lunch.  Discussed MI, stent, Brilinta, restrictions, HH and low sodium diet, and CRPII with pt and family. Very receptive. Encouraged daily wts as well. Will refer to [place] as pt works here in [employer]. [phone] Yves Dill CES, ACSM 11:47 AM 09/17/2020

## 2020-09-17 NOTE — Progress Notes (Signed)
CARDIAC REHAB PHASE I   PRE:  Rate/Rhythm: 92 SR with PVCs    BP: sitting 116/69    SaO2: 95 RA  MODE:  Ambulation: 370 ft   POST:  Rate/Rhythm: 100 ST    BP: sitting 127/89     SaO2: 94 RA  Pt ambulated steady gait, slow pace. No major c/o. SaO2 difficult to register then 94 RA x2. Left off O2 in bed for now. Wife present. Doing well. Gave lifevest video to view. [place], ACSM 09/17/2020 1:50 PM

## 2020-09-17 NOTE — Progress Notes (Signed)
Patient's ecg rhythm progressing from NSR with occasional to frequent unifocal PVC's to multifocal couplet PVC's. Occasional short runs of bigeminy noted. Patient c/o new onset mid sternal chest pain. Stated, " It feels like more pressure than pain."  Dr Gifford Shave notified of status change. Pain med given.  No change in chest pressure post medication. Ventricular bigeminy progressing from frequent to sustaining with occasional sinus beats. 12 lead EKG ordered. Dr Gifford Shave notified of EKG results. Orders received and implemented. See flow sheet for vital signs.

## 2020-09-17 NOTE — Plan of Care (Signed)
  Problem: Education: Goal: Ability to demonstrate management of disease process will improve Outcome: Progressing Goal: Ability to verbalize understanding of medication therapies will improve Outcome: Progressing Goal: Individualized Educational Video(s) Outcome: Progressing   Problem: Activity: Goal: Capacity to carry out activities will improve Outcome: Progressing   Problem: Cardiac: Goal: Ability to achieve and maintain adequate cardiopulmonary perfusion will improve Outcome: Progressing   Problem: Education: Goal: Understanding of cardiac disease, CV risk reduction, and recovery process will improve Outcome: Progressing Goal: Understanding of medication regimen will improve Outcome: Progressing Goal: Individualized Educational Video(s) Outcome: Progressing   Problem: Activity: Goal: Ability to tolerate increased activity will improve Outcome: Progressing   Problem: Cardiac: Goal: Ability to achieve and maintain adequate cardiopulmonary perfusion will improve Outcome: Progressing Goal: Vascular access site(s) Level 0-1 will be maintained Outcome: Progressing   Problem: Health Behavior/Discharge Planning: Goal: Ability to safely manage health-related needs after discharge will improve Outcome: Progressing   Problem: Education: Goal: Required Educational Video(s) Outcome: Progressing   Problem: Clinical Measurements: Goal: Postoperative complications will be avoided or minimized Outcome: Progressing   Problem: Skin Integrity: Goal: Demonstration of wound healing without infection will improve Outcome: Progressing   Problem: Education: Goal: Knowledge of General Education information will improve Description: Including pain rating scale, medication(s)/side effects and non-pharmacologic comfort measures Outcome: Progressing   Problem: Health Behavior/Discharge Planning: Goal: Ability to manage health-related needs will improve Outcome: Progressing    Problem: Clinical Measurements: Goal: Ability to maintain clinical measurements within normal limits will improve Outcome: Progressing Goal: Will remain free from infection Outcome: Progressing Goal: Diagnostic test results will improve Outcome: Progressing Goal: Respiratory complications will improve Outcome: Progressing Goal: Cardiovascular complication will be avoided Outcome: Progressing   Problem: Activity: Goal: Risk for activity intolerance will decrease Outcome: Progressing   Problem: Nutrition: Goal: Adequate nutrition will be maintained Outcome: Progressing   Problem: Coping: Goal: Level of anxiety will decrease Outcome: Progressing   Problem: Elimination: Goal: Will not experience complications related to bowel motility Outcome: Progressing Goal: Will not experience complications related to urinary retention Outcome: Progressing   Problem: Pain Managment: Goal: General experience of comfort will improve Outcome: Progressing   Problem: Safety: Goal: Ability to remain free from injury will improve Outcome: Progressing   Problem: Skin Integrity: Goal: Risk for impaired skin integrity will decrease Outcome: Progressing

## 2020-09-17 NOTE — Progress Notes (Signed)
TCTS BRIEF SICU PROGRESS NOTE  2 Days Post-Op  S/P Procedure(s) (LRB): LEFT HEART CATH AND CORONARY ANGIOGRAPHY (N/A) Coronary/Graft Acute MI Revascularization (N/A)   Stable day  Plan: Continue current plan  Rexene Alberts, MD 09/17/2020 4:56 PM

## 2020-09-17 NOTE — Progress Notes (Signed)
TCTS DAILY ICU PROGRESS NOTE                   [address]            [place] [address]          [phone]   2 Days Post-Op Procedure(s) (LRB): LEFT HEART CATH AND CORONARY ANGIOGRAPHY (N/A) Coronary/Graft Acute MI Revascularization (N/A)  Total Length of Stay:  LOS: 2 days   Subjective:  no events.  Ambulated today  Objective: Vital signs in last 24 hours: Temp:  [97.8 F (36.6 C)-99.7 F (37.6 C)] 99 F (37.2 C) (03/23 1530) Pulse Rate:  [39-157] 96 (03/23 1530) Cardiac Rhythm: Normal sinus rhythm (03/23 1200) Resp:  [17-31] 18 (03/23 1530) BP: (112-136)/(69-99) 112/87 (03/23 1500) SpO2:  [96 %-100 %] 97 % (03/23 1530) Weight:  [84.7 kg] 84.7 kg (03/23 0500)  Filed Weights   09/15/20 0607 09/16/20 0311 09/17/20 0500  Weight: 83.9 kg 89.6 kg 84.7 kg    Weight change: -4.9 kg   Hemodynamic parameters for last 24 hours: CVP:  [4 mmHg-8 mmHg] 5 mmHg  Intake/Output from previous day: 03/22 0701 - 03/23 0700 In: 874 [P.O.:50; I.V.:671.7; IV Piggyback:152.3] Out: 2651 [Urine:2651]       Intake/Output this shift: Total I/O In: 189.9 [I.V.:142.3; IV Piggyback:47.7] Out: 1575 [Urine:1575]      Current Meds: Scheduled Meds: . amiodarone  150 mg Intravenous Once  . aspirin  81 mg Oral Daily  . atorvastatin  80 mg Oral Daily  . Chlorhexidine Gluconate Cloth  6 each Topical Daily  . digoxin  0.125 mg Oral Daily  . insulin aspart  0-9 Units Subcutaneous Q4H  . mouth rinse  15 mL Mouth Rinse BID  . ondansetron (ZOFRAN) IV  4 mg Intravenous Q6H  . pantoprazole (PROTONIX) IV  40 mg Intravenous Daily  . sacubitril-valsartan  1 tablet Oral BID  . sodium chloride flush  10-40 mL Intracatheter Q12H  . sodium chloride flush  3 mL Intravenous Q12H  . spironolactone  12.5 mg Oral Daily  . ticagrelor  90 mg Oral BID   Continuous Infusions: . sodium chloride    . sodium chloride 10 mL/hr at 09/17/20 0434  . amiodarone 30 mg/hr (09/17/20 1541)   . milrinone 0.125 mcg/kg/min (09/17/20 1400)  . nitroGLYCERIN Stopped (09/16/20 0612)  . norepinephrine (LEVOPHED) Adult infusion Stopped (09/16/20 0051)   PRN Meds:.sodium chloride, acetaminophen, bisacodyl, morphine injection, ondansetron (ZOFRAN) IV, sodium chloride flush, sodium chloride flush  General appearance: alert, cooperative and no distress Neurologic: intact Heart: regular rate and rhythm Lungs: min dim in lower fields Abdomen: some distension, + BS Extremities: PAS in place Wound: incis healing well  Lab Results: CBC: Recent Labs    09/16/20 0325 09/17/20 0306  WBC 17.0* 14.6*  HGB 12.0* 10.8*  HCT 38.1* 33.1*  PLT 216 156   BMET:  Recent Labs    09/16/20 0325 09/17/20 0306  NA 134* 133*  K 4.6 3.9  CL 104 102  CO2 24 24  GLUCOSE 151* 139*  BUN 21 17  CREATININE 1.63* 1.13  CALCIUM 8.4* 8.7*    CMET: Lab Results  Component Value Date   WBC 14.6 (H) 09/17/2020   HGB 10.8 (L) 09/17/2020   HCT 33.1 (L) 09/17/2020   PLT 156 09/17/2020   GLUCOSE 139 (H) 09/17/2020   CHOL 156 11/09/2017   TRIG 74 09/16/2020   HDL 39.90 11/09/2017   LDLCALC 96 11/09/2017  ALT 172 (H) 09/15/2020   AST 197 (H) 09/15/2020   NA 133 (L) 09/17/2020   K 3.9 09/17/2020   CL 102 09/17/2020   CREATININE 1.13 09/17/2020   BUN 17 09/17/2020   CO2 24 09/17/2020   TSH 1.45 11/09/2017   PSA 1.08 11/09/2017   INR 1.1 09/12/2020   HGBA1C 6.0 (H) 09/15/2020   ABG    Component Value Date/Time   PHART 7.296 (L) 09/15/2020 2026   PCO2ART 38.9 09/15/2020 2026   PO2ART 266 (H) 09/15/2020 2026   HCO3 19.0 (L) 09/15/2020 2026   TCO2 20 (L) 09/15/2020 2026   ACIDBASEDEF 7.0 (H) 09/15/2020 2026   O2SAT 62.3 09/17/2020 0306     PT/INR: No results for input(s): LABPROT, INR in the last 72 hours. Radiology: No results found.   Assessment/Plan: S/P Procedure(s) (LRB): LEFT HEART CATH AND CORONARY ANGIOGRAPHY (N/A) Coronary/Graft Acute MI Revascularization (N/A)  milr  down to 0.125.  Management per CHF Advancing diet to dysphagia.  Will obtain swallow prior to discharge for documentation. All meds crushed for 1 week Will transfer to [facility]

## 2020-09-18 ENCOUNTER — Inpatient Hospital Stay (HOSPITAL_COMMUNITY): Payer: 59

## 2020-09-18 DIAGNOSIS — I5021 Acute systolic (congestive) heart failure: Secondary | ICD-10-CM | POA: Diagnosis not present

## 2020-09-18 LAB — BASIC METABOLIC PANEL
Anion gap: 8 (ref 5–15)
BUN: 11 mg/dL (ref 8–23)
CO2: 18 mmol/L — ABNORMAL LOW (ref 22–32)
Calcium: 8.3 mg/dL — ABNORMAL LOW (ref 8.9–10.3)
Chloride: 108 mmol/L (ref 98–111)
Creatinine, Ser: 0.92 mg/dL (ref 0.61–1.24)
GFR, Estimated: >60 mL/min (ref >60)
Glucose, Bld: 114 mg/dL — ABNORMAL HIGH (ref 70–99)
Potassium: 4.2 mmol/L (ref 3.5–5.1)
Sodium: 134 mmol/L — ABNORMAL LOW (ref 135–145)

## 2020-09-18 LAB — BPAM RBC
Blood Product Expiration Date: 202204212359
Blood Product Expiration Date: 202204222359
ISSUE DATE / TIME: 202203210945
ISSUE DATE / TIME: 202203210945
Unit Type and Rh: 5100
Unit Type and Rh: 5100

## 2020-09-18 LAB — COOXEMETRY PANEL
Carboxyhemoglobin: 1.3 % (ref 0.5–1.5)
Methemoglobin: 1 % (ref 0.0–1.5)
O2 Saturation: 58.8 %
Total hemoglobin: 11.5 g/dL — ABNORMAL LOW (ref 12.0–16.0)

## 2020-09-18 LAB — GLUCOSE, CAPILLARY
Glucose-Capillary: 101 mg/dL — ABNORMAL HIGH (ref 70–99)
Glucose-Capillary: 114 mg/dL — ABNORMAL HIGH (ref 70–99)
Glucose-Capillary: 118 mg/dL — ABNORMAL HIGH (ref 70–99)
Glucose-Capillary: 122 mg/dL — ABNORMAL HIGH (ref 70–99)
Glucose-Capillary: 91 mg/dL (ref 70–99)

## 2020-09-18 LAB — TYPE AND SCREEN
ABO/RH(D): O POS
Antibody Screen: NEGATIVE
Unit division: 0
Unit division: 0

## 2020-09-18 MED ORDER — SPIRONOLACTONE 25 MG PO TABS
25.0000 mg | ORAL_TABLET | Freq: Every day | ORAL | Status: DC
  Administered 2020-09-18 – 2020-09-19 (×2): 25 mg via ORAL
  Filled 2020-09-18 (×2): qty 1

## 2020-09-18 MED ORDER — IOHEXOL 300 MG/ML  SOLN: 150.0000 mL | Freq: Once | INTRAMUSCULAR | Status: DC | PRN

## 2020-09-18 MED ORDER — DAPAGLIFLOZIN PROPANEDIOL 10 MG PO TABS
10.0000 mg | ORAL_TABLET | Freq: Every day | ORAL | Status: DC
  Administered 2020-09-18 – 2020-09-19 (×2): 10 mg via ORAL
  Filled 2020-09-18 (×3): qty 1

## 2020-09-18 MED ORDER — IOHEXOL 300 MG/ML  SOLN
75.0000 mL | Freq: Once | INTRAMUSCULAR | Status: AC | PRN
  Administered 2020-09-18: 75 mL via ORAL

## 2020-09-18 MED ORDER — AMIODARONE HCL 200 MG PO TABS
200.0000 mg | ORAL_TABLET | Freq: Two times a day (BID) | ORAL | Status: DC
  Administered 2020-09-18: 200 mg via ORAL
  Filled 2020-09-18 (×2): qty 1

## 2020-09-18 NOTE — Progress Notes (Addendum)
TCTS DAILY ICU PROGRESS NOTE                   [address]            [place] [address]          [phone]   3 Days Post-Op Procedure(s) (LRB): LEFT HEART CATH AND CORONARY ANGIOGRAPHY (N/A) Coronary/Graft Acute MI Revascularization (N/A)  Total Length of Stay:  LOS: 3 days   Subjective:  conts to feel better/stronger  Objective: Vital signs in last 24 hours: Temp:  [98.2 F (36.8 C)-99 F (37.2 C)] 98.4 F (36.9 C) (03/24 0700) Pulse Rate:  [75-157] 78 (03/24 0630) Cardiac Rhythm: Normal sinus rhythm (03/24 0400) Resp:  [11-31] 18 (03/24 0630) BP: (86-134)/(58-99) 121/87 (03/24 0630) SpO2:  [94 %-100 %] 96 % (03/24 0630)  Filed Weights   09/15/20 0607 09/16/20 0311 09/17/20 0500  Weight: 83.9 kg 89.6 kg 84.7 kg    Weight change:    Hemodynamic parameters for last 24 hours: CVP:  [2 mmHg-8 mmHg] 5 mmHg  Intake/Output from previous day: 03/23 0701 - 03/24 0700 In: 499.1 [I.V.:451.5; IV Piggyback:47.7] Out: 2900 [Urine:2900]  Intake/Output this shift: No intake/output data recorded.  Current Meds: Scheduled Meds: . amiodarone  150 mg Intravenous Once  . aspirin  81 mg Oral Daily  . atorvastatin  80 mg Oral Daily  . Chlorhexidine Gluconate Cloth  6 each Topical Daily  . dapagliflozin propanediol  10 mg Oral Daily  . digoxin  0.125 mg Oral Daily  . insulin aspart  0-9 Units Subcutaneous Q4H  . ondansetron (ZOFRAN) IV  4 mg Intravenous Q6H  . pantoprazole (PROTONIX) IV  40 mg Intravenous Daily  . sacubitril-valsartan  1 tablet Oral BID  . sodium chloride flush  10-40 mL Intracatheter Q12H  . sodium chloride flush  3 mL Intravenous Q12H  . spironolactone  25 mg Oral Daily  . ticagrelor  90 mg Oral BID   Continuous Infusions: . sodium chloride    . sodium chloride 10 mL/hr at 09/17/20 0434  . amiodarone 30 mg/hr (09/18/20 0253)  . nitroGLYCERIN Stopped (09/16/20 0612)  . norepinephrine (LEVOPHED) Adult infusion Stopped (09/16/20 0051)    PRN Meds:.sodium chloride, acetaminophen, bisacodyl, morphine injection, ondansetron (ZOFRAN) IV, sodium chloride flush, sodium chloride flush  General appearance: alert, cooperative and no distress Heart: regular rate and rhythm and occas extrasystole Lungs: dim in lower fields Abdomen: benign exam Extremities: no edema Wound: incis look good, healing well  Lab Results: CBC: Recent Labs    09/16/20 0325 09/17/20 0306  WBC 17.0* 14.6*  HGB 12.0* 10.8*  HCT 38.1* 33.1*  PLT 216 156   BMET:  Recent Labs    09/17/20 0306 09/18/20 0415  NA 133* 134*  K 3.9 4.2  CL 102 108  CO2 24 18*  GLUCOSE 139* 114*  BUN 17 11  CREATININE 1.13 0.92  CALCIUM 8.7* 8.3*    CMET: Lab Results  Component Value Date   WBC 14.6 (H) 09/17/2020   HGB 10.8 (L) 09/17/2020   HCT 33.1 (L) 09/17/2020   PLT 156 09/17/2020   GLUCOSE 114 (H) 09/18/2020   CHOL 156 11/09/2017   TRIG 74 09/16/2020   HDL 39.90 11/09/2017   LDLCALC 96 11/09/2017   ALT 172 (H) 09/15/2020   AST 197 (H) 09/15/2020   NA 134 (L) 09/18/2020   K 4.2 09/18/2020   CL 108 09/18/2020   CREATININE 0.92 09/18/2020   BUN 11  09/18/2020   CO2 18 (L) 09/18/2020   TSH 1.45 11/09/2017   PSA 1.08 11/09/2017   INR 1.1 09/12/2020   HGBA1C 6.0 (H) 09/15/2020      PT/INR: No results for input(s): LABPROT, INR in the last 72 hours. Radiology: No results found.   Assessment/Plan: S/P Procedure(s) (LRB): LEFT HEART CATH AND CORONARY ANGIOGRAPHY (N/A) Coronary/Graft Acute MI Revascularization (N/A)  1 Tmax 99, Co-Ox 59(improved from yesterday), CVP 5, sinus rhythm with PVC's( decreased on amiodarone)- AHF team managing cardiology issues- stopping milrinone today, not requiring further diuresis for now- will need life vest at home 2 sats good on RA, will order an IS 3 normal renal fxn, excellent UOP 4 BS controlled 5 D1 diet, swallow ordered   Jerry Giovanni PA-C Pager [phone] 09/18/2020 8:12 AM   Agree with  above amio and milr transition per CHF Awaiting life vest Will transfer to [place] study today Home per CHF  Adalene Gulotta O [place]ar

## 2020-09-18 NOTE — Care Management (Signed)
09-18-20 Case Manager received a call from [facility] that the patient had an order submitted from Heart Failure PA- [place]. Case Manager will fax other clinicals to [facility] and will follow for insurance approval. Case Manager will continue to follow for additional transition of care needs. Bethena Roys, RN,BSN Case Manager

## 2020-09-18 NOTE — Progress Notes (Signed)
    Discussed w/ Dr. Aundra Dubin. Pt will need LifeVest before discharge. Order has been placed and I have contacted device rep.   Lyda Jester, PA-C 09/18/2020

## 2020-09-18 NOTE — Progress Notes (Signed)
Patient ID: Jerry Kramer, male   DOB: [birth year], 65 y.o.   MRN: [id number] Patient ID: Jerry Kramer, male   DOB: 1956/03/20, 65 y.o.   MRN: [id number]     Advanced Heart Failure Rounding Note  PCP-Cardiologist: No primary care provider on file.   Subjective:    No complaints this morning, co-ox 59% with CVP 5.  I/Os negative. Soft diet.    He has been walking with no complaints.   Echo: EF 25-30% with LAD territory WMAs and no mechanical MI complications   Objective:   Weight Range: 84.7 kg Body mass index is 29.25 kg/m.   Vital Signs:   Temp:  [98.2 F (36.8 C)-99 F (37.2 C)] 98.5 F (36.9 C) (03/23 2300) Pulse Rate:  [75-157] 78 (03/24 0630) Resp:  [11-31] 18 (03/24 0630) BP: (86-134)/(58-99) 121/87 (03/24 0630) SpO2:  [94 %-100 %] 96 % (03/24 0630) Last BM Date: 09/14/20 (per patient)  Weight change: Filed Weights   09/15/20 0607 09/16/20 0311 09/17/20 0500  Weight: 83.9 kg 89.6 kg 84.7 kg    Intake/Output:   Intake/Output Summary (Last 24 hours) at 09/18/2020 0736 Last data filed at 09/18/2020 0500 Gross per 24 hour  Intake 499.1 ml  Output 2900 ml  Net -2400.9 ml      Physical Exam    General: NAD Neck: No JVD, no thyromegaly or thyroid nodule.  Lungs: Clear to auscultation bilaterally with normal respiratory effort. CV: Nondisplaced PMI.  Heart regular S1/S2, no S3/S4, no murmur.  No peripheral edema.  Abdomen: Soft, nontender, no hepatosplenomegaly, no distention.  Skin: Intact without lesions or rashes.  Neurologic: Alert and oriented x 3.  Psych: Normal affect. Extremities: No clubbing or cyanosis.  HEENT: Normal.    Telemetry   NSR in 80s (personally reviewed)   Labs    CBC Recent Labs    09/16/20 0325 09/17/20 0306  WBC 17.0* 14.6*  HGB 12.0* 10.8*  HCT 38.1* 33.1*  MCV 84.7 83.6  PLT 216 782   Basic Metabolic Panel Recent Labs    09/15/20 2047 09/16/20 0325 09/17/20 0306 09/18/20 0415  NA 133*   < > 133* 134*  K 4.9    < > 3.9 4.2  CL 103   < > 102 108  CO2 20*   < > 24 18*  GLUCOSE 253*   < > 139* 114*  BUN 16   < > 17 11  CREATININE 1.46*   < > 1.13 0.92  CALCIUM 8.1*   < > 8.7* 8.3*  MG 1.5*  --  2.2  --    < > = values in this interval not displayed.   Liver Function Tests Recent Labs    09/15/20 2047  AST 197*  ALT 172*  ALKPHOS 61  BILITOT 1.0  PROT 6.1*  ALBUMIN 3.7   No results for input(s): LIPASE, AMYLASE in the last 72 hours. Cardiac Enzymes No results for input(s): CKTOTAL, CKMB, CKMBINDEX, TROPONINI in the last 72 hours.  BNP: BNP (last 3 results) No results for input(s): BNP in the last 8760 hours.  ProBNP (last 3 results) No results for input(s): PROBNP in the last 8760 hours.   D-Dimer No results for input(s): DDIMER in the last 72 hours. Hemoglobin A1C Recent Labs    09/15/20 2038  HGBA1C 6.0*   Fasting Lipid Panel Recent Labs    09/16/20 0325  TRIG 74   Thyroid Function Tests No results for input(s): TSH, T4TOTAL, T3FREE, THYROIDAB in  the last 72 hours.  Invalid input(s): FREET3  Other results:   Imaging    No results found.   Medications:     Scheduled Medications: . amiodarone  150 mg Intravenous Once  . aspirin  81 mg Oral Daily  . atorvastatin  80 mg Oral Daily  . Chlorhexidine Gluconate Cloth  6 each Topical Daily  . dapagliflozin propanediol  10 mg Oral Daily  . digoxin  0.125 mg Oral Daily  . insulin aspart  0-9 Units Subcutaneous Q4H  . ondansetron (ZOFRAN) IV  4 mg Intravenous Q6H  . pantoprazole (PROTONIX) IV  40 mg Intravenous Daily  . sacubitril-valsartan  1 tablet Oral BID  . sodium chloride flush  10-40 mL Intracatheter Q12H  . sodium chloride flush  3 mL Intravenous Q12H  . spironolactone  25 mg Oral Daily  . ticagrelor  90 mg Oral BID    Infusions: . sodium chloride    . sodium chloride 10 mL/hr at 09/17/20 0434  . amiodarone 30 mg/hr (09/18/20 0253)  . nitroGLYCERIN Stopped (09/16/20 0612)  . norepinephrine  (LEVOPHED) Adult infusion Stopped (09/16/20 0051)    PRN Medications: sodium chloride, acetaminophen, bisacodyl, morphine injection, ondansetron (ZOFRAN) IV, sodium chloride flush, sodium chloride flush     Assessment/Plan   1.  CAD: Post-op anterolateral STEMI.  No prior CAD but history of HTN and hyperlipidemia and strong FH of CAD.  He had occluded proximal LAD and D1, now s/p DES to LAD and PTCA to D1.  - Continue ASA 81, Brilinta.  - Continue atorvastatin.  2. Acute systolic CHF: Due to anterolateral MI. LVEDP elevated with flash pulmonary edema.  Echo with EF 25-30%, LAD territory WMAs with no evidence for mechanical MI complications. CVP 5 today with co-ox 59%, on milrinone 0.125.   - No Lasix needed.   - Stop milrinone.  - Arrange for Lifevest going home.  - Increase spironolactone to 25 mg daily.   - Continue Entresto 24/26 bid.  - Add dapagliflozin 10 mg daily.  3. PVCs: Much decreased on amiodarone.  - Stop amiodarone gtt, start amiodarone 200 mg bid. Will stop a couple of weeks post-discharge.   4. Acute hypoxemic respiratory failure: Due to pulmonary edema.  Improved with clear CXR.  - Resolved/extubated.  5. S/p robotic assisted laparoscopic paraesophageal hernia repair with fundoplication. - Post-op per [facility].  - Will need swallow study.  6. AKI: Resolved, creatinine down to 0.92.    Mobilize, ok for step-down from my standpoint, hopefully home tomorrow if continues to progress.   Length of Stay: 3  Loralie Champagne, MD  09/18/2020, 7:36 AM  Advanced Heart Failure Team Pager [phone] (M-F; 7a - 5p)  Please contact [facility] for night-coverage after hours (5p -7a ) and weekends on [email or web address]

## 2020-09-18 NOTE — Progress Notes (Signed)
Covering Trish inbasket today. Received request from [facility] to arrange post-hospital follow-up. CHF team has been following - reached out to APP to discuss timeframe of follow-up. They have already coordinated follow-up with their availability and placed info on AVS.

## 2020-09-18 NOTE — Plan of Care (Signed)
  Problem: Activity: Goal: Capacity to carry out activities will improve Outcome: Progressing   Problem: Cardiac: Goal: Ability to achieve and maintain adequate cardiopulmonary perfusion will improve Outcome: Progressing   Problem: Education: Goal: Individualized Educational Video(s) Outcome: Progressing   Problem: Cardiac: Goal: Ability to achieve and maintain adequate cardiopulmonary perfusion will improve Outcome: Progressing   Problem: Education: Goal: Knowledge of General Education information will improve Description: Including pain rating scale, medication(s)/side effects and non-pharmacologic comfort measures Outcome: Progressing   Problem: Pain Managment: Goal: General experience of comfort will improve Outcome: Progressing   Problem: Skin Integrity: Goal: Risk for impaired skin integrity will decrease Outcome: Progressing

## 2020-09-18 NOTE — Progress Notes (Signed)
CARDIAC REHAB PHASE I   PRE:  Rate/Rhythm: 86 SR    BP: sitting 122/75    SaO2:   MODE:  Ambulation: 740 ft   POST:  Rate/Rhythm: 100 ST    BP: sitting 136/94     SaO2:   Tolerated well, no c/o. SaO2 difficult to register. To BR then bed. [id number]   Darrick Meigs CES, ACSM 09/18/2020 2:39 PM

## 2020-09-18 NOTE — Discharge Summary (Incomplete)
Physician Discharge Summary  Patient ID: Jerry Kramer MRN: [id number] DOB/AGE: 08-11-1955 65 y.o.  Admit date: 09/15/2020 Discharge date: 09/18/2020  Admission Diagnoses:  Discharge Diagnoses:  Active Problems:   Hiatal hernia with gastroesophageal reflux   Acute ST elevation myocardial infarction (STEMI) due to occlusion of proximal portion of left anterior descending (LAD) coronary artery (HCC)   Coronary artery disease involving native coronary artery of native heart with unstable angina pectoris ([place])   Encounter for central line placement   Acute systolic CHF (congestive heart failure) [facility])  Patient Active Problem List   Diagnosis Date Noted  . Hiatal hernia with gastroesophageal reflux 09/15/2020  . Acute ST elevation myocardial infarction (STEMI) due to occlusion of proximal portion of left anterior descending (LAD) coronary artery ([place]) 09/15/2020  . Coronary artery disease involving native coronary artery of native heart with unstable angina pectoris ([place])   . Encounter for central line placement   . Acute systolic CHF (congestive heart failure) ([place])   . Dysphagia   . Hiatal hernia   . Gastritis and gastroduodenitis   . Esophageal stricture   . Presbyesophagus   . Obesity (BMI 30-39.9) 04/26/2014  . UNSPECIFIED OTALGIA 06/19/2010  . ELEVATED BLOOD PRESSURE 06/19/2010  . CONTACT DERMATITIS 09/03/2009  . HYPERLIPIDEMIA 08/27/2009    History of Present Illness:    Jerry Kramer 65 y.o. male who is self-referred for surgical evaluation of a hiatal hernia along with an esophageal stricture.  The patient has had a long history of gastroesophageal reflux which has been managed with antacids, but over the last several months he has noted episodes of gagging and dysphagia if he attempts to eat too fast.  He has been seen by gastroenterologist and underwent an upper endoscopy with dilation in July 2021.  He was noted to have a moderate lower esophageal stricture along with a hiatal  hernia.  In February 2022 he underwent manometry along with repeat upper endoscopy which showed a mild stenosis at 34 cm from the incisors.   Regards to his symptoms he only complains of dysphagia when he does not chew his food well.  His reflux symptoms have been well controlled with his medications.  He does not have a chronic cough or respiratory symptoms.  He denies any dysphagia. Patient and all relevant studies were reviewed by Dr. Kipp Brood who recommended proceeding with robotic repair.  He was admitted this hospitalization electively for the procedure.  Hospital course the patient was admitted and on 09/15/2020 taken to the operating room at which time he underwent robotic assisted hiatal hernia repair with mesh as well as esophagogastroduodenoscopy.  Additionally, he had a robotic assisted laparoscopic fundoplication.  He tolerated the procedure well and was taken to the postanesthesia care unit in stable condition.  Initially in the PACU he was quite stable but he did develop an episode of significant chest pain associated with PVCs.  An EKG showed ST elevation in the anterolateral leads and urgent cardiology consultation was obtained.  He was subsequently taken to the cardiac catheterization lab emergently where he was found to have acute occluded proximal LAD.  He was treated with DES and occluded diagonal #1 was treated with a PTCA.  Following the procedure the patient developed respiratory distress.  He received intravenous Lasix and was also intubated.  Critical care medicine has been consulted to assist with management.  He initially required milrinone drip but this has been weaned over time.  Additionally has undergone an excellent diuresis.  He  has been started on spironolactone as well as digoxin and Entresto for heart failure management as echocardiogram shows ejection fraction in the 25 to 30% range.  He will also require LifeVest at the time of discharge.  He was extubated without too much  difficulty as his acute pulmonary edema has resolved with improved chest x-ray appearance as well.  He did pass his esophageal swallow study without evidence of leak.  There is some evidence of dysmotility in the esophagus.    Discharged Condition: {condition:18240}  Consults: {consultation:18241}  Significant Diagnostic Studies: {diagnostics:18242}  Treatments: {Tx:18249}  Discharge Exam: Blood pressure 99/66, pulse 92, temperature 98 F (36.7 C), temperature source Oral, resp. rate (!) 27, height 5\' 7"  (1.702 m), weight 84.7 kg, SpO2 91 %. {physical OFBP:1025852}  Disposition:   Discharge Instructions    Amb Referral to Cardiac Rehabilitation   Complete by: As directed    Diagnosis:  Coronary Stents STEMI PTCA     After initial evaluation and assessments completed: Virtual Based Care may be provided alone or in conjunction with Phase 2 Cardiac Rehab based on patient barriers.: Yes     Allergies as of 09/18/2020      Reactions   Meperidine Hcl Nausea And Vomiting   Penicillins Other (See Comments)   REACTION: Childhood   Advil [ibuprofen] Palpitations   Pseudoephedrine Hcl Palpitations    Med Rec must be completed prior to using this [facility]***        Durable Medical Equipment  (From admission, onward)         Start     Ordered   09/18/20 1020  For home use only DME Vest life vest  Once       Comments: Length of need: 3 months Start Date: 09/19/20   09/18/20 1021          Follow-up Information    Lightfoot, Lucile Crater, MD Follow up.   Specialty: Cardiothoracic Surgery Why: Please see discharge paperwork for follow-up appointment with surgeon. You will also have appointments for follow up with cardiology Contact information: [address] [place] [address] [phone]               Signed: [name] 09/18/2020, 1:32 PM

## 2020-09-19 ENCOUNTER — Other Ambulatory Visit: Payer: Self-pay | Admitting: Adult Health

## 2020-09-19 DIAGNOSIS — I5021 Acute systolic (congestive) heart failure: Secondary | ICD-10-CM | POA: Diagnosis not present

## 2020-09-19 LAB — BASIC METABOLIC PANEL
Anion gap: 9 (ref 5–15)
BUN: 15 mg/dL (ref 8–23)
CO2: 21 mmol/L — ABNORMAL LOW (ref 22–32)
Calcium: 9.3 mg/dL (ref 8.9–10.3)
Chloride: 105 mmol/L (ref 98–111)
Creatinine, Ser: 1.18 mg/dL (ref 0.61–1.24)
GFR, Estimated: >60 mL/min (ref >60)
Glucose, Bld: 95 mg/dL (ref 70–99)
Potassium: 4.5 mmol/L (ref 3.5–5.1)
Sodium: 135 mmol/L (ref 135–145)

## 2020-09-19 LAB — GLUCOSE, CAPILLARY
Glucose-Capillary: 90 mg/dL (ref 70–99)
Glucose-Capillary: 83 mg/dL (ref 70–99)

## 2020-09-19 MED ORDER — TICAGRELOR 90 MG PO TABS: 90.0000 mg | ORAL_TABLET | Freq: Two times a day (BID) | ORAL | 6 refills | Status: DC

## 2020-09-19 MED ORDER — ATORVASTATIN CALCIUM 80 MG PO TABS: 80.0000 mg | ORAL_TABLET | Freq: Every day | ORAL | 6 refills | Status: DC

## 2020-09-19 MED ORDER — CARVEDILOL 3.125 MG PO TABS: 3.1250 mg | ORAL_TABLET | Freq: Two times a day (BID) | ORAL | 6 refills | Status: DC

## 2020-09-19 MED ORDER — DAPAGLIFLOZIN PROPANEDIOL 10 MG PO TABS: 10.0000 mg | ORAL_TABLET | Freq: Every day | ORAL | 6 refills | Status: DC

## 2020-09-19 MED ORDER — ASPIRIN 81 MG PO CHEW: 81.0000 mg | CHEWABLE_TABLET | Freq: Every day | ORAL | 6 refills | Status: DC

## 2020-09-19 MED ORDER — CARVEDILOL 3.125 MG PO TABS
3.1250 mg | ORAL_TABLET | Freq: Two times a day (BID) | ORAL | Status: DC
Start: 2020-09-19 — End: 2020-09-19
  Administered 2020-09-19: 3.125 mg via ORAL
  Filled 2020-09-19: qty 1

## 2020-09-19 MED ORDER — SPIRONOLACTONE 25 MG PO TABS: 25.0000 mg | ORAL_TABLET | Freq: Every day | ORAL | 6 refills | Status: DC

## 2020-09-19 MED ORDER — SACUBITRIL-VALSARTAN 24-26 MG PO TABS
1.0000 | ORAL_TABLET | Freq: Two times a day (BID) | ORAL | 6 refills | Status: DC
Start: 2020-09-19 — End: 2020-10-09

## 2020-09-19 MED ORDER — DIGOXIN 125 MCG PO TABS: 0.1250 mg | ORAL_TABLET | Freq: Every day | ORAL | 6 refills | Status: DC

## 2020-09-19 MED FILL — FARXIGA 10 MG TABLET: 10 | 30 days supply | Qty: 30 | Fill #0

## 2020-09-19 MED FILL — CARVEDILOL 3.125 MG TABLET: 3.125 | 30 days supply | Qty: 60 | Fill #0

## 2020-09-19 MED FILL — SPIRONOLACTONE 25 MG TABLET: 25 | 30 days supply | Qty: 30 | Fill #0

## 2020-09-19 MED FILL — BRILINTA 90 MG TABLET: 90 | 30 days supply | Qty: 60 | Fill #0

## 2020-09-19 MED FILL — ASPIRIN LOW DOSE 81 MG CHEW: 81 | 30 days supply | Qty: 30 | Fill #0

## 2020-09-19 MED FILL — DIGOXIN 0.125 MG TABLET: 125 | 30 days supply | Qty: 30 | Fill #0

## 2020-09-19 MED FILL — ENTRESTO 24 MG-26 MG TABLET: 24-26 | 30 days supply | Qty: 60 | Fill #0

## 2020-09-19 MED FILL — ATORVASTATIN CALCIUM 80 MG: 80 | 30 days supply | Qty: 30 | Fill #0

## 2020-09-19 NOTE — Discharge Instructions (Signed)
Esophageal Surgery   Patient and Family Education YOUR POST-Esophageal surgery  CHECKLIST    Dear Patient and Family Member(s): The purpose of this handout  is to help answer your questions about esophageal surgery.   Please take the time to look through this information and share with your family.   We suggest you write down any questions you have to ask your doctor and healthcare team.  This booklet also contains information from your surgeon's office, including general information, insurance information, and directions to the office.  It is important that you or your family keep this  The nurses and doctors will refer to this information, especially as you prepare to go home!  Please ask the staff any questions you may have!   Post op  CHECKLIST The purpose of this checklist is to make sure you will be ready to go home after surgery and have some items arranged BEFORE go home , so you have a smooth discharge from the hospital after your surgery.  o Quit smoking and avoid alcohol  o Do you have your home medicine list including pain medicine and understand it and are the prescriptions signed before you go home?  o Do you understand your diet instructions?   o Do NOT take any non-steroidal anti-inflammatory drugs (for example, Motrin, ibuprofen, Aleve)  o Arrange for someone to be at home with you for 24/7 for the first week after discharge from the hospital. This person needs to be able to help you get in and out of bed, prepare food, change dressings, and start tube feedings if needed.  o Get a 12-inch wedge or 12-inch bed lifts to raise the head of the bed after you go home. Sleeping with your head elevated will help prevent reflux.   o Have recommended foods at home for when you are discharged.   DIET INFORMATION Important points to keep in mind You should eat 6-8 small meals each day.  Large meals will not be well tolerated. Avoid very hot or cold beverages and spicy  foods.  Protein supplements, high-energy foods, or a soft diet may be ordered.  Drinking fluids between meals rather than with meals may also be helpful.    A clinical dietician will be involved in your care, who will help in planning your meals.  You should sit upright, chew slowly, and eat more than 3 hours before bedtime to reduce reflux.   1.   Your body needs added calories and protein to help heal itself. Start slowly and gradually eat more as you are able. 2.   Eat small, frequent meals at least six times per day. Pudding thick consistency 3.   Everyone tolerates foods differently.  Avoid those foods known to cause you problems. 4  Drink only nutritious beverages. Try unsweetened juice, Ensure, Boost or Carnation Instant Breakfast instead of coffee, tea, soda, or water. 5   Do not lay down immediately after eating. Stay upright for at least 2 hours.  DISCHARGE INSTRUCTIONS . Take a few minutes each day to inspect your surgical incision for any signs or symptoms of infection or other complications (increased pain, swelling, fever, drainage, saliva leaking at the incision site). Report any problems to your surgeon immediately. See your doctor right away if you experience any difficulty swallowing. . If you smoked, do not go back to smoking!! Do not be in a room or a car with someone else who is smoking. Smoking and tobacco use can make you not heal  as well. [place] offers FREE smoking cessation classes that can help you quit smoking. For information on classes, and to register, call [phone]. [place] Kitchen Shower every day. Wash the incision gently with a mild unscented soap. Use a clean washcloth each time you wash. Pat your incision dry. Do not use lotions, creams, ointments, powders, or home remedies on or near your incisions. Your incision may be numb or sore for several weeks or months after your surgery. . Avoid heavy activity or anything that tenses your body for 12 weeks after  surgery. You may resume your daily activities, work, and sexual relations as soon as you feel able to do so. No driving for the first 3 weeks after going home. . Weigh yourself several times a week. Report any significant weight changes to your doctor (10 pounds in 2 weeks). . Try not to take pain relievers longer than 4 to 7 days. Talk with your doctor if you continue to have pain that requires pain medication after a few days.  . To prevent constipation, take stool softeners at least as long as you take pain medication. If you are sent home with antibiotics, please take all of them even if you feel fine. . Call your doctor if any of the following occur: fever of 101 degrees or higher, increased pain, swelling, redness, draining, or bleeding around your incision; vomiting; excessive weakness; tarry (black) stools; new, unexplained symptoms (they may be adverse reactions to drugs used in treatment); progressive weight loss; or continuous diarrhea. . Call the Surgeons office immediately if your feeding tube becomes clogged or falls out.(not applicable) . Keep follow-up appointments so that your physician can check on your progress and condition.  Bring your medicine you are currently taking with you to your appointmen      [address]        [place], [place]  [address]  At [facility] we are dedicated to serving our patients.  We are committed to caring for you in the most competent and courteous manner possible.  We hope that the following information will be helpful.  If you have any questions, please do not hesitate to ask.  OFFICE HOURS  Our office hours are 9:00AM to 5:00PM, Monday through Friday.  Our patients are seen by appointment only. If you are having any problems, we encourage you to call during office hours. In case of an emergency, one of our surgeons is on call at all times. If the office is closed, call the main switchboard and  hold until the answering service responds. Your call will be returned as soon as possible.  Telephone Numbers Main Number:  [phone] For Appointments: [phone] Fax Number: [phone]  GENERAL INFORMATION There are five physicians in the [facility] practice.  Each doctor has a scheduled office day once a week, in which to see his patient s. The remainder of the week is left open for surgical cases. Our doctors perform surgery at [facility] and [facility]. Because of the nature of their surgical cases, it is sometimes necessary to cancel appointments or rearrange appointment times. However, if that happens, we put forth every effort to keep the patient's best interest first on our priority list.   [facility] Percell Miller B. Servando Snare, MD     [place] Handler, PA-C Gaye Pollack, MD.      Johann Capers  [place], PA-C Len Childs, MD     Jadene Pierini, PA-C [place] Roxan Hockey, MD     Lars Pinks, PA-C Valentina Gu. Roxy Manns, MD             Directions to our office:  Traveling [place] [address]: From [address], turn right onto [address], then turn left onto [address]. Turn right onto [address] and then left onto [address].  The [facility] will be on the left.  Traveling [place]: From [address], turn left onto [address], then turn right onto [address]. Turn right onto [address] and then left onto [address].  The [facility] will be on the left.  [place]: From [place], turn right onto [address], then take the [address] exit. Follow [address] northbound. Continue until these streets divide and follow [address] to the right. Continue through downtown [place]. Turn right onto [address] and then right onto [address].  The [facility] will be on the left.  Traveling [place]: From [place], turn  left onto [address] [place], then take the [address] exit. Follow [address] northbound. Continue until these streets div ide and follow [address] to the right. Continue through downtown [place]. Turn right onto [address] and then right onto [address]. The [facility] will be on the left.  Traveling [address]: From [address] [place], take the [address] exit. Follow [address] for about 7 miles and turn left onto [address]. Go one block and turn left onto [facility] and then left onto [address].  The [facility] will be on the left.  Traveling [address] [place]: From [place], take the [address] exit. Follow [address] northbound. Continue until these streets divide and follow [address] to the right. Continue through downtown [place]. Turn right onto [address] and then right onto [address].  The [facility] will be on the left.  Traveling [address]: From [place], take the [address] exit. Follow [address] northbound. Continue until these streets divide and follow [address] to the right. Continue through downtown [place]. Turn right onto [address] and then right onto [address].  The [facility] will be on the left.   Information about your medication: Brilinta (anti-platelet agent)  Generic Name (Brand): ticagrelor (Brilinta), twice daily medication  PURPOSE: You are taking this medication along with aspirin to lower your chance of having a heart attack, stroke, or blood clots in your heart stent. These can be fatal. Brilinta and aspirin help prevent platelets from sticking together and forming a clot that can block an artery or your stent.   Common SIDE EFFECTS you may experience include: bruising or bleeding more easily, shortness of breath  Do not stop taking BRILINTA without talking to the doctor who prescribes it for  you. People who are treated with a stent and stop taking Brilinta too soon, have a higher risk of getting a blood clot in the stent, having a heart attack, or dying. If you stop Brilinta because of bleeding, or for other reasons, your risk of a heart attack or stroke may increase.   Avoid taking NSAID agents or anti-inflammatory medications such as ibuprofen, naproxen given increased bleed risk with plavix - can use acetaminophen (Tylenol) if needed for pain.  Tell all of your  doctors and dentists that you are taking Brilinta. They should talk to the doctor who prescribed Brilinta for you before you have any surgery or invasive procedure.   Contact your health care provider if you experience: severe or uncontrollable bleeding, pink/red/brown urine, vomiting blood or vomit that looks like "coffee grounds", red or black stools (looks like tar), coughing up blood or blood clots ----------------------------------------------------------------------------------------------------------------------

## 2020-09-19 NOTE — Progress Notes (Signed)
Patient ID: Jerry Kramer, male   DOB: May 26, 1956, 65 y.o.   MRN: [id number]     Advanced Heart Failure Rounding Note  PCP-Cardiologist: No primary care provider on file.   Subjective:    No dyspnea or chest pain.  Walking in halls.   Echo: EF 25-30% with LAD territory WMAs and no mechanical MI complications   Objective:   Weight Range: 84.7 kg Body mass index is 29.25 kg/m.   Vital Signs:   Temp:  [98 F (36.7 C)-99.6 F (37.6 C)] 98.4 F (36.9 C) (03/25 0700) Pulse Rate:  [79-99] 89 (03/24 1645) Resp:  [15-27] 19 (03/24 2000) BP: (99-123)/(66-91) 115/78 (03/25 0000) SpO2:  [91 %-100 %] 99 % (03/24 1645) Last BM Date: 09/14/20 (per patient)  Weight change: Filed Weights   09/15/20 0607 09/16/20 0311 09/17/20 0500  Weight: 83.9 kg 89.6 kg 84.7 kg    Intake/Output:   Intake/Output Summary (Last 24 hours) at 09/19/2020 0739 Last data filed at 09/19/2020 0400 Gross per 24 hour  Intake 974.52 ml  Output 125 ml  Net 849.52 ml      Physical Exam    General: NAD Neck: No JVD, no thyromegaly or thyroid nodule.  Lungs: Clear to auscultation bilaterally with normal respiratory effort. CV: Nondisplaced PMI.  Heart regular S1/S2, no S3/S4, no murmur.  No peripheral edema.   Abdomen: Soft, nontender, no hepatosplenomegaly, no distention.  Skin: Intact without lesions or rashes.  Neurologic: Alert and oriented x 3.  Psych: Normal affect. Extremities: No clubbing or cyanosis.  HEENT: Normal.   Telemetry   NSR in 80s (personally reviewed)   Labs    CBC Recent Labs    09/17/20 0306  WBC 14.6*  HGB 10.8*  HCT 33.1*  MCV 83.6  PLT 342   Basic Metabolic Panel Recent Labs    09/17/20 0306 09/18/20 0415 09/19/20 0337  NA 133* 134* 135  K 3.9 4.2 4.5  CL 102 108 105  CO2 24 18* 21*  GLUCOSE 139* 114* 95  BUN 17 11 15   CREATININE 1.13 0.92 1.18  CALCIUM 8.7* 8.3* 9.3  MG 2.2  --   --    Liver Function Tests No results for input(s): AST, ALT, ALKPHOS,  BILITOT, PROT, ALBUMIN in the last 72 hours. No results for input(s): LIPASE, AMYLASE in the last 72 hours. Cardiac Enzymes No results for input(s): CKTOTAL, CKMB, CKMBINDEX, TROPONINI in the last 72 hours.  BNP: BNP (last 3 results) No results for input(s): BNP in the last 8760 hours.  ProBNP (last 3 results) No results for input(s): PROBNP in the last 8760 hours.   D-Dimer No results for input(s): DDIMER in the last 72 hours. Hemoglobin A1C No results for input(s): HGBA1C in the last 72 hours. Fasting Lipid Panel No results for input(s): CHOL, HDL, LDLCALC, TRIG, CHOLHDL, LDLDIRECT in the last 72 hours. Thyroid Function Tests No results for input(s): TSH, T4TOTAL, T3FREE, THYROIDAB in the last 72 hours.  Invalid input(s): FREET3  Other results:   Imaging    DG ESOPHAGUS W SINGLE CM (SOL OR THIN BA)  Result Date: 09/18/2020 CLINICAL DATA:  Postop hiatal hernia repair. EXAM: ESOPHOGRAM/BARIUM SWALLOW TECHNIQUE: Single contrast examination was performed using water-soluble contrast (Omnipaque 300). FLUOROSCOPY TIME:  Fluoroscopy Time:  0 minutes and 42 seconds Radiation Exposure Index (if provided by the fluoroscopic device): 9.5 mGy Number of Acquired Spot Images: 0 COMPARISON:  Chest CT 09/10/2020 FINDINGS: Several swallows of water soluble contrast demonstrate normal passage of  contrast into the stomach. The fundoplication channel is patent. No complicating features. Esophageal dysmotility is noted with frequent tertiary contractions and esophageal spasm. No leaking contrast material was identified. IMPRESSION: 1. Expected postsurgical changes with fundoplication. 2. No complicating features such as extravasation of contrast or leakage. 3. Esophageal dysmotility. Electronically Signed   By: Marijo Sanes M.D.   On: 09/18/2020 11:00     Medications:     Scheduled Medications: . aspirin  81 mg Oral Daily  . atorvastatin  80 mg Oral Daily  . carvedilol  3.125 mg Oral BID WC   . Chlorhexidine Gluconate Cloth  6 each Topical Daily  . dapagliflozin propanediol  10 mg Oral Daily  . digoxin  0.125 mg Oral Daily  . insulin aspart  0-9 Units Subcutaneous Q4H  . ondansetron (ZOFRAN) IV  4 mg Intravenous Q6H  . pantoprazole (PROTONIX) IV  40 mg Intravenous Daily  . sacubitril-valsartan  1 tablet Oral BID  . sodium chloride flush  10-40 mL Intracatheter Q12H  . sodium chloride flush  3 mL Intravenous Q12H  . spironolactone  25 mg Oral Daily  . ticagrelor  90 mg Oral BID    Infusions: . sodium chloride    . sodium chloride 10 mL/hr at 09/17/20 0434  . nitroGLYCERIN Stopped (09/16/20 0612)  . norepinephrine (LEVOPHED) Adult infusion Stopped (09/16/20 0051)    PRN Medications: sodium chloride, acetaminophen, bisacodyl, iohexol, morphine injection, ondansetron (ZOFRAN) IV, sodium chloride flush, sodium chloride flush     Assessment/Plan   1.  CAD: Post-op anterolateral STEMI.  No prior CAD but history of HTN and hyperlipidemia and strong FH of CAD.  He had occluded proximal LAD and D1, now s/p DES to LAD and PTCA to D1.  - Continue ASA 81, Brilinta.  - Continue atorvastatin.  - Will need cardiac rehab.  2. Acute systolic CHF: Due to anterolateral MI. LVEDP elevated with flash pulmonary edema.  Echo with EF 25-30%, LAD territory WMAs with no evidence for mechanical MI complications. Now off milrinone, looks euvolemic.    - No Lasix needed.   - Arrange for Lifevest going home.  - Continue spironolactone 25 mg daily.   - Continue Entresto 24/26 bid.  - Continue dapagliflozin 10 mg daily.  - Add Coreg 3.125 mg bid.  3. PVCs: Resolved.  - Stop amiodarone and start Coreg 3.125 mg bid for discharge.  4. Acute hypoxemic respiratory failure: Due to pulmonary edema.  Improved with clear CXR.  - Resolved/extubated.  5. S/p robotic assisted laparoscopic paraesophageal hernia repair with fundoplication. - Post-op per TCTS.  6. AKI: Resolved, creatinine normal.   OK  for discharge from my standpoint.  Followup in CHF clinic 10-14 days.  Meds for discharge: Coreg 3.125 mg bid, dapagliflozin 10 daily, Entresto 24/26 bid, spironolactone 25 daily, atorvastatin 80 daily, ASA 81 daily, ticagrelor 90 bid.   Length of Stay: [place], [place]  09/19/2020, 7:39 AM  Advanced Heart Failure Team Pager [phone] (M-F; 7a - 5p)  Please contact [facility] for night-coverage after hours (5p -7a ) and weekends on [email or web address]

## 2020-09-19 NOTE — Progress Notes (Signed)
Patient discharged home via wheelchair accompanied by his wife.  IV removed.  Discharge instructions given and complete.

## 2020-09-19 NOTE — Progress Notes (Signed)
Discussed exercise guidelines and NTG with pt and family.  Reviewed signs of fluid overload. Receptive. [place] CES, ACSM 8:28 AM 09/19/2020

## 2020-09-19 NOTE — Discharge Summary (Signed)
Advanced Heart Failure Team  Discharge Summary   Patient ID: Jerry Kramer MRN: [id number], DOB/AGE: 1955/07/07 65 y.o. Admit date: 09/15/2020 D/C date:     09/19/2020   Primary Discharge Diagnoses:  1. CAD: Post-op anterolateral STEMI. No prior CAD but history of HTN and hyperlipidemia and strong FH of CAD. He had occluded proximal LAD and D1, now s/p DES to LAD and PTCA to D1.  2. Acute systolic CHF 3. PVCs: Resolved.  4. Acute hypoxemic respiratory failure 5. S/p robotic assisted laparoscopic paraesophageal hernia repair with fundoplication. 6. AKI: Resolved, creatinine normal.   Hospital Course:  Jerry Kramer is a 65 y.o. with history of HTN, hyperlipidemia, GERD/hiatal hernia.  He is a nonsmoker, mother and 2 brothers had MIs in 46s but he had no prior cardiac history.  He has been dealing with peptic stricture and hiatal hernia for a while now; he saw Dr. Kipp Brood and underwent robotic assisted laparoscopic paraesophageal hernia repair with fundoplication.  Post-op, he was noted to have PVCs then developed chest pain with anterolateral ST elevation.  He was taken emergently for cath.  He was found to have acute occlusion of the proximal LAD.  This was treated with DES, and occluded D1 was treated with PTCA.  LVEDP was around 26 mmHg.  After procedure, patient developed respiratory distress. Diuresed with IV lasix with improvement. Had ECHO that showed EF 25-30%. GDMT initiated. See below for detailed problem list. He will continue to be followed closely in the HF clinic.  1. CAD: Post-op anterolateral STEMI. No prior CAD but history of HTN and hyperlipidemia and strong FH of CAD. He had occluded proximal LAD and D1, now s/p DES to LAD and PTCA to D1.  - Continue ASA 81, Brilinta.  - Continue atorvastatin.  - Will need outpatient cardiac rehab.  2. Acute systolic CHF: Due to anterolateral MI. LVEDP elevated with flash pulmonary edema.  Echo with EF 25-30%, LAD territory WMAs with no  evidence for mechanical MI complications. Now off milrinone, looks euvolemic.    Initially on IV lasix. He does not require daily diuretics.  - Lifevest was arranged for home.   - Continue spironolactone 25 mg daily.   - Continue Entresto 24/26 bid.  - Continue dapagliflozin 10 mg daily.  - Continue Coreg 3.125 mg bid.  3. PVCs: Resolved.  - On amio but later switched to coreg.  - Started on Coreg 3.125 mg bid  4. Acute hypoxemic respiratory failure: Due to pulmonary edema.  Improved with clear CXR.  - Resolved/extubated.  - Improved with diuresis.  5. S/p robotic assisted laparoscopic paraesophageal hernia repair with fundoplication. - Post-op per TCTS.  - He has follow up with Dr Kipp Brood.  6. AKI: Creatinine peaked at 1.6 but normalized at discharge.   Discharge Vitals: Blood pressure 115/78, pulse 89, temperature 98.4 F (36.9 C), temperature source Oral, resp. rate 19, height 5\' 7"  (1.702 m), weight 84.7 kg, SpO2 99 %.  Labs: Lab Results  Component Value Date   WBC 14.6 (H) 09/17/2020   HGB 10.8 (L) 09/17/2020   HCT 33.1 (L) 09/17/2020   MCV 83.6 09/17/2020   PLT 156 09/17/2020    Recent Labs  Lab 09/15/20 2047 09/16/20 0325 09/19/20 0337  NA 133*   < > 135  K 4.9   < > 4.5  CL 103   < > 105  CO2 20*   < > 21*  BUN 16   < > 15  CREATININE 1.46*   < >  1.18  CALCIUM 8.1*   < > 9.3  PROT 6.1*  --   --   BILITOT 1.0  --   --   ALKPHOS 61  --   --   ALT 172*  --   --   AST 197*  --   --   GLUCOSE 253*   < > 95   < > = values in this interval not displayed.   Lab Results  Component Value Date   CHOL 156 11/09/2017   HDL 39.90 11/09/2017   LDLCALC 96 11/09/2017   TRIG 74 09/16/2020   BNP (last 3 results) No results for input(s): BNP in the last 8760 hours.  ProBNP (last 3 results) No results for input(s): PROBNP in the last 8760 hours.   Diagnostic Studies/Procedures   DG ESOPHAGUS W SINGLE CM (SOL OR THIN BA)  Result Date: 09/18/2020 CLINICAL DATA:   Postop hiatal hernia repair. EXAM: ESOPHOGRAM/BARIUM SWALLOW TECHNIQUE: Single contrast examination was performed using water-soluble contrast (Omnipaque 300). FLUOROSCOPY TIME:  Fluoroscopy Time:  0 minutes and 42 seconds Radiation Exposure Index (if provided by the fluoroscopic device): 9.5 mGy Number of Acquired Spot Images: 0 COMPARISON:  Chest CT 09/10/2020 FINDINGS: Several swallows of water soluble contrast demonstrate normal passage of contrast into the stomach. The fundoplication channel is patent. No complicating features. Esophageal dysmotility is noted with frequent tertiary contractions and esophageal spasm. No leaking contrast material was identified. IMPRESSION: 1. Expected postsurgical changes with fundoplication. 2. No complicating features such as extravasation of contrast or leakage. 3. Esophageal dysmotility. Electronically Signed   By: Marijo Sanes M.D.   On: 09/18/2020 11:00    Discharge Medications   Allergies as of 09/19/2020      Reactions   Meperidine Hcl Nausea And Vomiting   Penicillins Other (See Comments)   REACTION: Childhood   Advil [ibuprofen] Palpitations   Pseudoephedrine Hcl Palpitations      Medication List    TAKE these medications   aspirin 81 MG chewable tablet Chew 1 tablet (81 mg total) by mouth daily.   atorvastatin 80 MG tablet Commonly known as: LIPITOR Take 1 tablet (80 mg total) by mouth daily.   carvedilol 3.125 MG tablet Commonly known as: COREG Take 1 tablet (3.125 mg total) by mouth 2 (two) times daily with a meal.   dapagliflozin propanediol 10 MG Tabs tablet Commonly known as: FARXIGA Take 1 tablet (10 mg total) by mouth daily.   digoxin 0.125 MG tablet Commonly known as: LANOXIN Take 1 tablet (0.125 mg total) by mouth daily.   pantoprazole 40 MG tablet Commonly known as: PROTONIX Take 1 tablet (40 mg total) by mouth daily.   sacubitril-valsartan 24-26 MG Commonly known as: ENTRESTO Take 1 tablet by mouth 2 (two) times  daily.   sildenafil 100 MG tablet Commonly known as: Viagra Take 1 tablet (100 mg total) by mouth daily as needed for erectile dysfunction.   spironolactone 25 MG tablet Commonly known as: ALDACTONE Take 1 tablet (25 mg total) by mouth daily.   ticagrelor 90 MG Tabs tablet Commonly known as: BRILINTA Take 1 tablet (90 mg total) by mouth 2 (two) times daily.            Durable Medical Equipment  (From admission, onward)         Start     Ordered   09/18/20 1020  For home use only DME Vest life vest  Once       Comments: Length of need: 3  months Start Date: 09/19/20   09/18/20 1021          Disposition   The patient will be discharged in stable condition to home. Life Vest on at discharge.  Discharge Instructions    (HEART FAILURE PATIENTS) Call MD:  Anytime you have any of the following symptoms: 1) 3 pound weight gain in 24 hours or 5 pounds in 1 week 2) shortness of breath, with or without a dry hacking cough 3) swelling in the hands, feet or stomach 4) if you have to sleep on extra pillows at night in order to breathe.   Complete by: As directed    Amb Referral to Cardiac Rehabilitation   Complete by: As directed    Diagnosis:  Coronary Stents STEMI PTCA     After initial evaluation and assessments completed: Virtual Based Care may be provided alone or in conjunction with Phase 2 Cardiac Rehab based on patient barriers.: Yes   Diet - low sodium heart healthy   Complete by: As directed    Heart Failure patients record your daily weight using the same scale at the same time of day   Complete by: As directed    Increase activity slowly   Complete by: As directed       Follow-up Information    [place], Lucile Crater, MD Follow up.   Specialty: Cardiothoracic Surgery Why: Please see discharge paperwork for follow-up appointment with surgeon. You will also have appointments for follow up with cardiology Contact information: [address] [place]   [address] [place] Follow up on 10/09/2020.   Specialty: Cardiology Why: 3:30 PM [facility] at [facility], [place] [address] Contact information: [address] [id number] [place] [phone]                Duration of Discharge Encounter: Greater than 35 minutes   Signed, Darrick Grinder NP-C  09/19/2020, 7:48 AM

## 2020-09-23 ENCOUNTER — Telehealth (HOSPITAL_COMMUNITY): Payer: Self-pay

## 2020-09-23 ENCOUNTER — Other Ambulatory Visit: Payer: Self-pay

## 2020-09-23 ENCOUNTER — Telehealth: Payer: Self-pay

## 2020-09-23 MED ORDER — OMEPRAZOLE 2 MG/ML ORAL SUSPENSION: 40.0000 mg | Freq: Every day | ORAL | 1 refills | Status: DC

## 2020-09-23 NOTE — Telephone Encounter (Signed)
Called patient to see if he is interested in the Cardiac Rehab Program. Patient expressed interest. Explained scheduling process and went over insurance, patient verbalized understanding. Will contact patient for scheduling once f/u has been completed.  °

## 2020-09-23 NOTE — Telephone Encounter (Signed)
Pt insurance is active and benefits verified through [employer]. Co-pay $0.00, DED $2,750.00/$1,853.94 met, out of pocket $6,000.00/$4,903.84 met, co-insurance 20%. No pre-authorization required. Passport, 09/23/20 @11:33AM, REF#[id number]  Will contact patient to see if he is interested in the Cardiac Rehab Program. If interested, patient will need to complete follow up appt. Once completed, patient will be contacted for scheduling upon review by the RN Navigator. 

## 2020-09-23 NOTE — Telephone Encounter (Signed)
Please let patient know that I am thinking about him as he recovers from an acute MI which he suffered after his hiatal hernia repair.  I do think we should continue PPI and would substitute liquid omeprazole 40 mg once daily which he can continue for 2 to 4 weeks until he is cleared to resume tablets.  Once he can resume tablets he can resume pantoprazole 40 mg daily  Thanks JMP

## 2020-09-23 NOTE — Telephone Encounter (Signed)
Sent order to patient's pharmacy for Omeprazole suspension- 40mg  QD, a 2 week supply with 1 refill. Called patient and let him know, and that when he is released to take pills again, he can go back to Pantoprazole-40mg  tabs

## 2020-09-26 ENCOUNTER — Other Ambulatory Visit: Payer: Self-pay

## 2020-09-26 ENCOUNTER — Encounter: Payer: Self-pay | Admitting: Thoracic Surgery (Cardiothoracic Vascular Surgery)

## 2020-09-26 ENCOUNTER — Ambulatory Visit: Payer: 59 | Admitting: Thoracic Surgery (Cardiothoracic Vascular Surgery)

## 2020-09-26 ENCOUNTER — Ambulatory Visit (INDEPENDENT_AMBULATORY_CARE_PROVIDER_SITE_OTHER): Payer: Self-pay | Admitting: Thoracic Surgery (Cardiothoracic Vascular Surgery)

## 2020-09-26 ENCOUNTER — Ambulatory Visit: Payer: Self-pay | Admitting: Thoracic Surgery (Cardiothoracic Vascular Surgery)

## 2020-09-26 VITALS — BP 129/88 | HR 73 | Resp 20 | Ht 67.0 in | Wt 178.0 lb

## 2020-09-26 DIAGNOSIS — K449 Diaphragmatic hernia without obstruction or gangrene: Secondary | ICD-10-CM

## 2020-09-26 NOTE — Progress Notes (Signed)
      [address]       [place] [address]             [phone]        Jerry Kramer [facility] Medical Record #[id number] Date of Birth: 1956/04/10  Referring: Jerry Bears, MD Primary Care: Jerry Post, MD Primary Cardiologist:No primary care provider on file.  Reason for visit:   follow-up  History of Present Illness:     [name] comes in for his 1 week appointment.  He is doing well.  He has been tolerating his mechanical soft diet without any dysphagia.  He denies any reflux.  Physical Exam: BP 129/88 (BP Location: Right Arm, Patient Position: Sitting)   Pulse 73   Resp 20   Ht 5\' 7"  (1.702 m)   Wt 178 lb (80.7 kg)   SpO2 94% Comment: RA  BMI 27.88 kg/m   Alert NAD Incision clean.   Abdomen soft, ND    Diagnostic Studies & Laboratory data:  Path: Hernia sac    Assessment / Plan:   65 year old male status Kramer robotic assisted paraesophageal hernia repair with Dor fundoplication.  He suffered of Kramer op STEMI that was treated with PCI.  He is recovering quite well.  I have instructed him to advance his diet for tender food.  He no longer needs to crush his medications as well.  He will follow-up in 1 month with a chest x-ray.   Jerry Kramer 09/26/2020 4:35 PM

## 2020-10-01 ENCOUNTER — Telehealth: Payer: Self-pay | Admitting: *Deleted

## 2020-10-01 NOTE — Telephone Encounter (Signed)
-----   Message from Jerene Bears, MD sent at 09/30/2020  4:50 PM EDT ----- Approval: [id number]  For CT chest performed on 09/10/2020

## 2020-10-09 ENCOUNTER — Ambulatory Visit (HOSPITAL_COMMUNITY)
Admit: 2020-10-09 | Discharge: 2020-10-09 | Disposition: A | Discharge status: Home / Self Care | Payer: Managed Care, Other (non HMO) | Attending: Cardiology | Admitting: Cardiology

## 2020-10-09 ENCOUNTER — Encounter (HOSPITAL_COMMUNITY): Payer: Self-pay

## 2020-10-09 ENCOUNTER — Other Ambulatory Visit: Payer: Self-pay

## 2020-10-09 VITALS — BP 110/78 | HR 88 | Wt 175.2 lb

## 2020-10-09 DIAGNOSIS — I2510 Atherosclerotic heart disease of native coronary artery without angina pectoris: Secondary | ICD-10-CM | POA: Diagnosis not present

## 2020-10-09 DIAGNOSIS — I110 Hypertensive heart disease with heart failure: Secondary | ICD-10-CM | POA: Diagnosis not present

## 2020-10-09 DIAGNOSIS — Z7982 Long term (current) use of aspirin: Secondary | ICD-10-CM | POA: Insufficient documentation

## 2020-10-09 DIAGNOSIS — Z880 Allergy status to penicillin: Secondary | ICD-10-CM | POA: Insufficient documentation

## 2020-10-09 DIAGNOSIS — Z886 Allergy status to analgesic agent status: Secondary | ICD-10-CM | POA: Diagnosis not present

## 2020-10-09 DIAGNOSIS — I493 Ventricular premature depolarization: Secondary | ICD-10-CM | POA: Insufficient documentation

## 2020-10-09 DIAGNOSIS — I5020 Unspecified systolic (congestive) heart failure: Secondary | ICD-10-CM | POA: Insufficient documentation

## 2020-10-09 DIAGNOSIS — I97191 Other postprocedural cardiac functional disturbances following other surgery: Secondary | ICD-10-CM | POA: Insufficient documentation

## 2020-10-09 DIAGNOSIS — Z955 Presence of coronary angioplasty implant and graft: Secondary | ICD-10-CM | POA: Diagnosis not present

## 2020-10-09 DIAGNOSIS — Z48812 Encounter for surgical aftercare following surgery on the circulatory system: Secondary | ICD-10-CM | POA: Insufficient documentation

## 2020-10-09 DIAGNOSIS — I2109 ST elevation (STEMI) myocardial infarction involving other coronary artery of anterior wall: Secondary | ICD-10-CM | POA: Insufficient documentation

## 2020-10-09 DIAGNOSIS — Z79899 Other long term (current) drug therapy: Secondary | ICD-10-CM | POA: Insufficient documentation

## 2020-10-09 DIAGNOSIS — Z8249 Family history of ischemic heart disease and other diseases of the circulatory system: Secondary | ICD-10-CM | POA: Diagnosis not present

## 2020-10-09 DIAGNOSIS — Z9861 Coronary angioplasty status: Secondary | ICD-10-CM | POA: Diagnosis not present

## 2020-10-09 DIAGNOSIS — I5022 Chronic systolic (congestive) heart failure: Secondary | ICD-10-CM | POA: Diagnosis not present

## 2020-10-09 DIAGNOSIS — Z7984 Long term (current) use of oral hypoglycemic drugs: Secondary | ICD-10-CM | POA: Diagnosis not present

## 2020-10-09 HISTORY — DX: Heart failure, unspecified: I50.9

## 2020-10-09 LAB — BASIC METABOLIC PANEL
Anion gap: 8 (ref 5–15)
BUN: 13 mg/dL (ref 8–23)
CO2: 25 mmol/L (ref 22–32)
Calcium: 9 mg/dL (ref 8.9–10.3)
Chloride: 104 mmol/L (ref 98–111)
Creatinine, Ser: 1.09 mg/dL (ref 0.61–1.24)
GFR, Estimated: >60 mL/min (ref >60)
Glucose, Bld: 113 mg/dL — ABNORMAL HIGH (ref 70–99)
Potassium: 4.1 mmol/L (ref 3.5–5.1)
Sodium: 137 mmol/L (ref 135–145)

## 2020-10-09 LAB — CBC
HCT: 39.9 % (ref 39.0–52.0)
Hemoglobin: 12.8 g/dL — ABNORMAL LOW (ref 13.0–17.0)
MCH: 27.9 pg (ref 26.0–34.0)
MCHC: 32.1 g/dL (ref 30.0–36.0)
MCV: 86.9 fL (ref 80.0–100.0)
Platelets: 270 10*3/uL (ref 150–400)
RBC: 4.59 MIL/uL (ref 4.22–5.81)
RDW: 16.8 % — ABNORMAL HIGH (ref 11.5–15.5)
WBC: 5.7 10*3/uL (ref 4.0–10.5)
nRBC: 0 % (ref 0.0–0.2)

## 2020-10-09 LAB — DIGOXIN LEVEL: Digoxin Level: 0.9 ng/mL (ref 0.8–2.0)

## 2020-10-09 MED ORDER — ENTRESTO 49-51 MG PO TABS: 1.0000 | ORAL_TABLET | Freq: Two times a day (BID) | ORAL | 6 refills | Status: DC

## 2020-10-09 MED ORDER — TICAGRELOR 90 MG PO TABS: 90.0000 mg | ORAL_TABLET | Freq: Two times a day (BID) | ORAL | 6 refills | Status: DC

## 2020-10-09 MED ORDER — ASPIRIN 81 MG PO CHEW: 81.0000 mg | CHEWABLE_TABLET | Freq: Every day | ORAL | 6 refills | Status: DC

## 2020-10-09 MED ORDER — SPIRONOLACTONE 25 MG PO TABS: 25.0000 mg | ORAL_TABLET | Freq: Every day | ORAL | 6 refills | Status: DC

## 2020-10-09 MED ORDER — DAPAGLIFLOZIN PROPANEDIOL 10 MG PO TABS: 10.0000 mg | ORAL_TABLET | Freq: Every day | ORAL | 6 refills | Status: DC

## 2020-10-09 MED ORDER — ATORVASTATIN CALCIUM 80 MG PO TABS: 80.0000 mg | ORAL_TABLET | Freq: Every day | ORAL | 6 refills | Status: DC

## 2020-10-09 MED ORDER — CARVEDILOL 3.125 MG PO TABS: ORAL_TABLET | Freq: Two times a day (BID) | ORAL | 6 refills | Status: DC

## 2020-10-09 MED ORDER — DIGOXIN 125 MCG PO TABS: 0.1250 mg | ORAL_TABLET | Freq: Every day | ORAL | 6 refills | Status: DC

## 2020-10-09 NOTE — Patient Instructions (Signed)
INCREASE Entresto to 49/51 mg, one tab twice a day  Labs today We will only contact you if something comes back abnormal or we need to make some changes. Otherwise no news is good news!  Your physician recommends that you schedule a follow-up appointment in: 3-4 weeks with the pharmacy team and in 6 weeks with Dr Aundra Dubin with echo  Your physician has requested that you have an echocardiogram. Echocardiography is a painless test that uses sound waves to create images of your heart. It provides your doctor with information about the size and shape of your heart and how well your heart's chambers and valves are working. This procedure takes approximately one hour. There are no restrictions for this procedure.  Do the following things EVERYDAY: 1) Weigh yourself in the morning before breakfast. Write it down and keep it in a log. 2) Take your medicines as prescribed 3) Eat low salt foods--Limit salt (sodium) to 2000 mg per day.  4) Stay as active as you can everyday 5) Limit all fluids for the day to less than 2 liters  At the [facility], you and your health needs are our priority. As part of our continuing mission to provide you with exceptional heart care, we have created designated Provider Care Teams. These Care Teams include your primary Cardiologist (physician) and Advanced Practice Providers (APPs- Physician Assistants and Nurse Practitioners) who all work together to provide you with the care you need, when you need it.   You may see any of the following providers on your designated Care Team at your next follow up: Marland Kitchen Dr Glori Bickers . Dr Loralie Champagne . Dr Vickki Muff . Darrick Grinder, NP . Lyda Jester, [place] . Audry Riles, PharmD   Please be sure to bring in all your medications bottles to every appointment.   If you have any questions or concerns before your next appointment please send [place] a message through [place] or call our office  at [phone].    TO LEAVE A MESSAGE FOR THE NURSE SELECT OPTION 2, PLEASE LEAVE A MESSAGE INCLUDING: . YOUR NAME . DATE OF BIRTH . CALL BACK NUMBER . REASON FOR CALL**this is important as we prioritize the call backs  YOU WILL RECEIVE A CALL BACK THE SAME DAY AS LONG AS YOU CALL BEFORE 4:00 PM

## 2020-10-09 NOTE — Progress Notes (Signed)
Advanced Heart Failure Clinic Note   Referring Physician: PCP: Eulas Post, MD PCP-Cardiologist: Dr. Aundra Dubin   Reason for Visit: [facility] f/u systolic heart failure, anterior MI s/p STEMI    HPI:  65 y.o. with history of HTN, hyperlipidemia, GERD/hiatal hernia.  He is a nonsmoker, mother and 2 brothers had MIs in 22s but he had no prior cardiac history, until recently. Now w/ newly diagnosed systolic heart failure and CAD. His daughter, [name], works in [employer].  He has been dealing with peptic stricture and hiatal hernia for a while now; he saw Dr. Kipp Brood and underwent robotic assisted laparoscopic paraesophageal hernia repair with fundoplication on 09/28/45.  Post-op, he was noted to have PVCs then developed chest pain with anterolateral ST elevation.  He was taken emergently for cath.  He was found to have acute occlusion of the proximal LAD.  This was treated with DES, and occluded D1 was treated with PTCA. LVEDP was around 26 mmHg.  After procedure, patient developed respiratory distress w/ lactic acidosis and AKI. Intubated and started on IV Lasix. Central line placed for co-ox and CVP monitoring. He was started on milrinone for low co-ox. Amiodarone started for PVC suppression. He responded well to therapy. Diuresed, extubated and able to wean off milrionone. PVCs well suppressed w/ amiodarone. GDMT initiated + DAPT w/ ASA + Brilinta for LAD stent. Amiodarone was discontinued and low dose  blocker added. Discharged home w/ lifeVest.   He presents to clinic today for post hospital f/u. Here w/ wife and daughter. Wearing LifeVest. Doing well. No further CP. No dyspnea, LEE, orthopnea, PND, wt gain, palpitations, syncope/ near syncope. Reports full med compliance w/o side effects. BP 110/78 w/o orthostatic symptoms.     [place] 09/15/20 SUMMARY Acute inferolateral STEMI involving proximal LAD-diagonal bifurcation  Severe two-vessel bifurcation disease of the LAD-D1 with large  thrombotic 100% occlusion just proximal to the bifurcation ? Successful DES PCI of the LAD across the diagonal branch with a resolute Onyx 3.5 mm x 30 mm overlapped distally with a 3.0 x 8 mm stent (overlap postdilated to 3.6 mm, proximal 3.5 stent postdilated to 4.0 mm) ? PTCA of side branch of D1 restoring TIMI II flow in the sidebranch and TIMI I flow with distal occlusion of the major branch of D1    Echo 09/15/20 1. Left ventricular ejection fraction, by estimation, is 25 to 30%. The left ventricle has severely decreased function. The left ventricle demonstrates regional wall motion abnormalities with mid to apical anterior, anteroseptal, and inferoseptal akinesis; the true apex and the apical lateral and apical inferior walls are akinetic. Left ventricular diastolic parameters are consistent with Grade II diastolic dysfunction (pseudonormalization). 2. Right ventricular systolic function is normal. The right ventricular size is normal. Tricuspid regurgitation signal is inadequate for assessing PA pressure. 3. The mitral valve is normal in structure. Trivial mitral valve regurgitation. No evidence of mitral stenosis. 4. The aortic valve is tricuspid. Aortic valve regurgitation is not visualized. Mild aortic valve sclerosis is present, with no evidence of aortic valve stenosis. 5. IVC not well-visualized.   Review of systems complete and found to be negative unless listed in HPI.      Past Medical History:  Diagnosis Date  . Allergy   . CHF (congestive heart failure) ([place])   . Colon polyps   . Complication of anesthesia    hard to wake up  . CONTACT DERMATITIS 09/03/2009  . ELEVATED BLOOD PRESSURE 06/19/2010  . HYPERLIPIDEMIA 08/27/2009  .  UNSPECIFIED OTALGIA 06/19/2010    Current Outpatient Medications  Medication Sig Dispense Refill  . aspirin 81 MG chewable tablet Chew 1 tablet (81 mg total) by mouth daily. 30 tablet 6  . atorvastatin (LIPITOR) 80 MG tablet Take 1 tablet (80  mg total) by mouth daily. 30 tablet 6  . carvedilol (COREG) 3.125 MG tablet TAKE 1 TABLET (3.125 MG TOTAL) BY MOUTH TWO TIMES DAILY WITH A MEAL. 60 tablet 6  . dapagliflozin propanediol (FARXIGA) 10 MG TABS tablet Take 1 tablet (10 mg total) by mouth daily. 30 tablet 6  . digoxin (LANOXIN) 0.125 MG tablet Take 1 tablet (0.125 mg total) by mouth daily. 30 tablet 6  . sacubitril-valsartan (ENTRESTO) 24-26 MG TAKE 1 TABLET BY MOUTH TWO TIMES DAILY. 60 tablet 6  . sildenafil (VIAGRA) 100 MG tablet Take 1 tablet (100 mg total) by mouth daily as needed for erectile dysfunction. 10 tablet 5  . spironolactone (ALDACTONE) 25 MG tablet Take 1 tablet (25 mg total) by mouth daily. 30 tablet 6  . ticagrelor (BRILINTA) 90 MG TABS tablet Take 1 tablet (90 mg total) by mouth 2 (two) times daily. 60 tablet 6  . pantoprazole (PROTONIX) 40 MG tablet Take 1 tablet (40 mg total) by mouth daily. 30 tablet 11   No current facility-administered medications for this encounter.    Allergies  Allergen Reactions  . Meperidine Hcl Nausea And Vomiting  . Penicillins Other (See Comments)    REACTION: Childhood  . Advil [Ibuprofen] Palpitations  . Pseudoephedrine Hcl Palpitations      Social History   Socioeconomic History  . Marital status: Married    Spouse name: Not on file  . Number of children: Not on file  . Years of education: Not on file  . Highest education level: Not on file  Occupational History  . Not on file  Tobacco Use  . Smoking status: Never Smoker  . Smokeless tobacco: Never Used  Vaping Use  . Vaping Use: Never used  Substance and Sexual Activity  . Alcohol use: No  . Drug use: No  . Sexual activity: Not on file  Other Topics Concern  . Not on file  Social History Narrative   Occupation: Secondary school teacher   Married   Never Smoked   Alcohol use- no   Social Determinants of Radio broadcast assistant Strain: Not on file  Food Insecurity: Not on file  Transportation Needs: Not  on file  Physical Activity: Not on file  Stress: Not on file  Social Connections: Not on file  Intimate Partner Violence: Not on file      Family History  Problem Relation Age of Onset  . Heart disease Mother 48       CAD  . Cancer Mother        lung  . Hyperlipidemia Father   . Stroke Father   . Colon polyps Father   . Diabetes Sister        type II  . Heart attack Brother 31  . Heart disease Brother 36       MI  . Colon cancer Neg Hx   . Esophageal cancer Neg Hx   . Pancreatic cancer Neg Hx   . Stomach cancer Neg Hx   . Rectal cancer Neg Hx     Vitals:   10/09/20 1535  BP: 110/78  Pulse: 88  SpO2: 99%  Weight: 79.5 kg     PHYSICAL EXAM: General:  Well appearing male. No  respiratory difficulty, wearing lifevest  HEENT: normal Neck: supple. no JVD. Carotids 2+ bilat; no bruits. No lymphadenopathy or thyromegaly appreciated. Cor: PMI nondisplaced. Regular rate & rhythm. No rubs, gallops or murmurs. Lungs: clear Abdomen: soft, nontender, nondistended. No hepatosplenomegaly. No bruits or masses. Good bowel sounds. Extremities: no cyanosis, clubbing, rash, edema Neuro: alert & oriented x 3, cranial nerves grossly intact. moves all 4 extremities w/o difficulty. Affect pleasant.  ECG: not performed    ASSESSMENT & PLAN:   1. CAD: Post-op anterolateral STEMI 09/15/20. No prior CAD but history of HTN and hyperlipidemia and strong FH of CAD. He had occluded proximal LAD and D1, now s/p DES to LAD and PTCA to D1.  - stable w/o CP  - Continue ASA 81 + Brilinta for a minimum of 12 months - Continue atorvastatin 80 mg nightly. LDL goal < 70. Needs FLP in 6 weeks  - refer to cardiac rehab  2. Systolic CHF: Due to anterolateral MI. Echo with EF 25-30%, LAD territory WMAs with no evidence for mechanical MI complications. NYHA Class II. Euvolemic on exam. - Increase Entresto to 49-51 mg bid  - Continue spironolactone 25 mg  - Continue dapagliflozin 10 mg daily. No GU  symptoms  - Continue Coreg 3.125 mg bid.  - no need for loop diuretic currently. Discussed daily wts + low sodium diet - check BMP today and again in 7 days  - Plan f/u echo ~40 days post MI to reassess LVEF (~mid May). If EF remains <35%, will refer to EP for ICD 3. PVCs: Resolved. No high detection rate on LifeVest interrogation  - Continue Coreg 3.125 mg bid  - Continue LifeVest until repeat echo  6. S/p robotic assisted laparoscopic paraesophageal hernia repair with fundoplication. - followed by TCTS.   F/u in 3 weeks for further med titration w/ pharmD. F/u w/ Dr. Aundra Dubin in ~6 weeks w/ repeat echo    Lyda Jester, PA-C 10/09/20

## 2020-10-13 ENCOUNTER — Telehealth (HOSPITAL_COMMUNITY): Payer: Self-pay | Admitting: *Deleted

## 2020-10-13 DIAGNOSIS — I5022 Chronic systolic (congestive) heart failure: Secondary | ICD-10-CM

## 2020-10-13 MED ORDER — DIGOXIN 125 MCG PO TABS: 0.0625 mg | ORAL_TABLET | Freq: Every day | ORAL | 6 refills | Status: DC

## 2020-10-13 NOTE — Telephone Encounter (Signed)
-----   Message from [place], [place] sent at 10/13/2020  3:01 PM EDT ----- Labs stable but recommend reducing dig to 0.0625 mg daily and repeat dig level in 1 week.

## 2020-10-13 NOTE — Telephone Encounter (Signed)
Harvie Junior, [facility]  10/13/2020 3:48 PM EDT Back to Top     Pt aware and agreeable with plan. Lab appt scheduled.    Brittainy Erie Noe, PA-C  10/13/2020 3:01 PM EDT      Labs stable but recommend reducing dig to 0.0625 mg daily and repeat dig level in 1 week.

## 2020-10-22 ENCOUNTER — Ambulatory Visit (HOSPITAL_COMMUNITY)
Admission: RE | Admit: 2020-10-22 | Discharge: 2020-10-22 | Disposition: A | Discharge status: Home / Self Care | Payer: Managed Care, Other (non HMO) | Source: Ambulatory Visit | Attending: Internal Medicine | Admitting: Internal Medicine

## 2020-10-22 ENCOUNTER — Other Ambulatory Visit: Payer: Self-pay

## 2020-10-22 DIAGNOSIS — I5022 Chronic systolic (congestive) heart failure: Secondary | ICD-10-CM

## 2020-10-22 LAB — DIGOXIN LEVEL: Digoxin Level: 0.8 ng/mL (ref 0.8–2.0)

## 2020-10-28 ENCOUNTER — Encounter (HOSPITAL_COMMUNITY): Payer: Self-pay

## 2020-10-29 ENCOUNTER — Encounter (HOSPITAL_COMMUNITY): Payer: Self-pay | Admitting: Cardiology

## 2020-10-30 ENCOUNTER — Other Ambulatory Visit: Payer: Self-pay | Admitting: Thoracic Surgery (Cardiothoracic Vascular Surgery)

## 2020-10-30 DIAGNOSIS — K222 Esophageal obstruction: Secondary | ICD-10-CM

## 2020-10-31 ENCOUNTER — Ambulatory Visit (INDEPENDENT_AMBULATORY_CARE_PROVIDER_SITE_OTHER): Payer: Self-pay | Admitting: Physician Assistant

## 2020-10-31 ENCOUNTER — Ambulatory Visit: Payer: 59 | Admitting: Thoracic Surgery (Cardiothoracic Vascular Surgery)

## 2020-10-31 ENCOUNTER — Other Ambulatory Visit: Payer: Self-pay

## 2020-10-31 ENCOUNTER — Ambulatory Visit
Admission: RE | Admit: 2020-10-31 | Discharge: 2020-10-31 | Disposition: A | Discharge status: Home / Self Care | Payer: Managed Care, Other (non HMO) | Source: Ambulatory Visit | Attending: Thoracic Surgery (Cardiothoracic Vascular Surgery) | Admitting: Thoracic Surgery (Cardiothoracic Vascular Surgery)

## 2020-10-31 VITALS — BP 104/65 | HR 96 | Resp 20 | Ht 67.0 in | Wt 176.0 lb

## 2020-10-31 DIAGNOSIS — K222 Esophageal obstruction: Secondary | ICD-10-CM

## 2020-10-31 DIAGNOSIS — K449 Diaphragmatic hernia without obstruction or gangrene: Secondary | ICD-10-CM

## 2020-10-31 DIAGNOSIS — Z09 Encounter for follow-up examination after completed treatment for conditions other than malignant neoplasm: Secondary | ICD-10-CM

## 2020-10-31 NOTE — Progress Notes (Signed)
[address]       [place] [address]             [phone]      Jerry Kramer is a 65 y.o. male patient status post paraesophageal hernia repair on 09/15/2020 with Dr. Kipp Brood.  Jerry Kramer then suffered a postop STEMI and was treated with PCI.  Overall, he is recovering well.  He has been following up with the heart failure team.  LifeVest in place.  Last echo showed a 25 to 30% ejection fraction.   1. Surgery follow-up examination s/p Paraesopageal hernia repair 09/15/20   2. Esophageal stricture   3. Hiatal hernia    Past Medical History:  Diagnosis Date  . Allergy   . CHF (congestive heart failure) ([place])   . Colon polyps   . Complication of anesthesia    hard to wake up  . CONTACT DERMATITIS 09/03/2009  . ELEVATED BLOOD PRESSURE 06/19/2010  . HYPERLIPIDEMIA 08/27/2009  . UNSPECIFIED OTALGIA 06/19/2010   No past surgical history pertinent negatives on file. Scheduled Meds: Current Outpatient Medications on File Prior to Visit  Medication Sig Dispense Refill  . aspirin 81 MG chewable tablet Chew 1 tablet (81 mg total) by mouth daily. 30 tablet 6  . atorvastatin (LIPITOR) 80 MG tablet Take 1 tablet (80 mg total) by mouth daily. 30 tablet 6  . carvedilol (COREG) 3.125 MG tablet TAKE 1 TABLET (3.125 MG TOTAL) BY MOUTH TWO TIMES DAILY WITH A MEAL. 60 tablet 6  . dapagliflozin propanediol (FARXIGA) 10 MG TABS tablet Take 1 tablet (10 mg total) by mouth daily. 30 tablet 6  . digoxin (LANOXIN) 0.125 MG tablet Take 0.5 tablets (0.0625 mg total) by mouth daily. 30 tablet 6  . pantoprazole (PROTONIX) 40 MG tablet Take 1 tablet (40 mg total) by mouth daily. 30 tablet 11  . sacubitril-valsartan (ENTRESTO) 49-51 MG Take 1 tablet by mouth 2 (two) times daily. 60 tablet 6  . sildenafil (VIAGRA) 100 MG tablet Take 1 tablet (100 mg total) by mouth daily as needed for erectile dysfunction. 10 tablet 5  . spironolactone (ALDACTONE) 25 MG tablet Take 1 tablet (25 mg total) by  mouth daily. 30 tablet 6  . ticagrelor (BRILINTA) 90 MG TABS tablet Take 1 tablet (90 mg total) by mouth 2 (two) times daily. 60 tablet 6   No current facility-administered medications on file prior to visit.    Allergies  Allergen Reactions  . Meperidine Hcl Nausea And Vomiting  . Penicillins Other (See Comments)    REACTION: Childhood  . Advil [Ibuprofen] Palpitations  . Pseudoephedrine Hcl Palpitations   Active Problems:   * No active hospital problems. *  Blood pressure 104/65, pulse 96, resp. rate 20, height 5\' 7"  (1.702 m), weight 176 lb (79.8 kg), SpO2 94 %.  Subjective   Jerry Kramer comes to the office today for a routine 1 month follow-up visit.  Overall, he has been keeping busy with his postop appointments.  He follows up with the heart failure clinic for his LifeVest.  Objective   Cor: Regular rate and rhythm with occasional PVCs Pulm: Clear to auscultation bilaterally and in all Abd: No tenderness Ext: No swelling   CLINICAL DATA:  Esophageal stricture  EXAM: CHEST - 2 VIEW  COMPARISON:  09/16/2020  FINDINGS: Lungs are clear. No pleural effusion or pneumothorax. No evidence of a hiatal hernia. No acute osseous abnormality.  IMPRESSION: No acute process in the chest.  Electronically Signed   By: Macy Mis M.D.   On: 10/31/2020 14:45   Assessment & Plan   Overall, patient has been recovering well.  He has been progressively advancing his diet to a normal diet.  He had an incident a few weeks ago where he had some dysphagia while drinking water however, it did not happen again.  He did not have any vomiting with this episode.  Since then, his swallowing has felt pretty close to normal.  He has been having normal bowel movements.  I reviewed his chest x-ray within which showed no evidence of hiatal hernia.  Heart failure has been titrating his medications as tolerated.  He has an appointment with Dr. Aundra Dubin on 11/26/2020.  He shares that he will  meet with the pharmacy staff before then for some medication adjustments.  He hopes that he will be able to remove his LifeVest soon.  I have provided him with referral to the [facility].  We will plan to see him in the clinic again.  I will discuss with Dr. Kipp Brood when he would like to see him back.  If he has any new issues or has any questions that arise he is encouraged to call the clinic.   No new medications were prescribed at this visit.  Jerry Kramer 10/31/2020

## 2020-11-03 ENCOUNTER — Encounter (HOSPITAL_COMMUNITY): Payer: Self-pay

## 2020-11-03 ENCOUNTER — Other Ambulatory Visit: Payer: Self-pay | Admitting: *Deleted

## 2020-11-03 NOTE — Progress Notes (Signed)
Per. Joellyn Rued, PA, referral made to [facility].

## 2020-11-07 ENCOUNTER — Ambulatory Visit: Payer: Managed Care, Other (non HMO) | Admitting: Thoracic Surgery (Cardiothoracic Vascular Surgery)

## 2020-11-10 NOTE — Progress Notes (Signed)
Advanced Heart Failure Clinic Note    PCP: Eulas Post, MD PCP-Cardiologist: Dr. Aundra Dubin   Reason for Visit: Heart Failure  HPI: 65 y.o. with history of HTN, hyperlipidemia, GERD/hiatal hernia.  Jerry Kramer is a nonsmoker, mother and 2 brothers had MIs in 70s but Jerry Kramer had no prior cardiac history, until recently. Now w/ newly diagnosed systolic heart failure and CAD. His daughter, [name], works in [employer] PACU.  Jerry Kramer has been dealing with peptic stricture and hiatal hernia for a while now; Jerry Kramer saw Dr. Kipp Brood and underwent robotic assisted laparoscopic paraesophageal hernia repair with fundoplication on 08/18/23.  Post-op, Jerry Kramer was noted to have PVCs then developed chest pain with anterolateral ST elevation.  Jerry Kramer was taken emergently for cath.  Jerry Kramer was found to have acute occlusion of the proximal LAD.  This was treated with DES, and occluded D1 was treated with PTCA. LVEDP was around 26 mmHg.  After procedure, patient developed respiratory distress w/ lactic acidosis and AKI. Intubated and started on IV Lasix. Central line placed for co-ox and CVP monitoring. Jerry Kramer was started on milrinone for low co-ox. Amiodarone started for PVC suppression. Jerry Kramer responded well to therapy. Diuresed, extubated and able to wean off milrionone. PVCs well suppressed w/ amiodarone. GDMT initiated + DAPT w/ ASA + Brilinta for LAD stent. Amiodarone was discontinued and low dose  blocker added. Discharged home w/ lifeVest.   Off digoxin, dig level 0.9--Stopped.   Today Jerry Kramer returns for HF follow up.Overall feeling fine. SBP 90s at home. Says lower SBP makes him feel bad. Denies SOB/PND/Orthopnea. No chest pain. No bleeding issues. Wearing Life Vest. Walking two times a day 20 minutes each time. Appetite ok. No fever or chills. Weight at home has been stable. Taking all medications. Working full time at [employer] as Therapist, sports.    [place] Interogation: Average heart rate 69, 98% wear time, no alerts   [facility]  09/15/20 SUMMARY Acute inferolateral STEMI involving proximal LAD-diagonal bifurcation  Severe two-vessel bifurcation disease of the LAD-D1 with large thrombotic 100% occlusion just proximal to the bifurcation ? Successful DES PCI of the LAD across the diagonal branch with a resolute Onyx 3.5 mm x 30 mm overlapped distally with a 3.0 x 8 mm stent (overlap postdilated to 3.6 mm, proximal 3.5 stent postdilated to 4.0 mm) ? PTCA of side branch of D1 restoring TIMI II flow in the sidebranch and TIMI I flow with distal occlusion of the major branch of D1  Echo 09/15/20 1. Left ventricular ejection fraction, by estimation, is 25 to 30%. The left ventricle has severely decreased function. The left ventricle demonstrates regional wall motion abnormalities with mid to apical anterior, anteroseptal, and inferoseptal akinesis; the true apex and the apical lateral and apical inferior walls are akinetic. Left ventricular diastolic parameters are consistent with Grade II diastolic dysfunction (pseudonormalization). 2. Right ventricular systolic function is normal. The right ventricular size is normal. Tricuspid regurgitation signal is inadequate for assessing PA pressure. 3. The mitral valve is normal in structure. Trivial mitral valve regurgitation. No evidence of mitral stenosis. 4. The aortic valve is tricuspid. Aortic valve regurgitation is not visualized. Mild aortic valve sclerosis is present, with no evidence of aortic valve stenosis. 5. IVC not well-visualized.   Review of systems complete and found to be negative unless listed in HPI.      Past Medical History:  Diagnosis Date  . Allergy   . CHF (congestive heart failure) ([place])   . Colon polyps   .  Complication of anesthesia    hard to wake up  . CONTACT DERMATITIS 09/03/2009  . ELEVATED BLOOD PRESSURE 06/19/2010  . HYPERLIPIDEMIA 08/27/2009  . UNSPECIFIED OTALGIA 06/19/2010    Current Outpatient Medications  Medication Sig Dispense  Refill  . aspirin 81 MG chewable tablet Chew 1 tablet (81 mg total) by mouth daily. 30 tablet 6  . atorvastatin (LIPITOR) 80 MG tablet Take 1 tablet (80 mg total) by mouth daily. 30 tablet 6  . carvedilol (COREG) 3.125 MG tablet TAKE 1 TABLET (3.125 MG TOTAL) BY MOUTH TWO TIMES DAILY WITH A MEAL. 60 tablet 6  . dapagliflozin propanediol (FARXIGA) 10 MG TABS tablet Take 1 tablet (10 mg total) by mouth daily. 30 tablet 6  . pantoprazole (PROTONIX) 40 MG tablet Take 1 tablet (40 mg total) by mouth daily. 30 tablet 11  . sacubitril-valsartan (ENTRESTO) 49-51 MG Take 1 tablet by mouth 2 (two) times daily. 60 tablet 6  . sildenafil (VIAGRA) 100 MG tablet Take 1 tablet (100 mg total) by mouth daily as needed for erectile dysfunction. 10 tablet 5  . spironolactone (ALDACTONE) 25 MG tablet Take 1 tablet (25 mg total) by mouth daily. 30 tablet 6  . ticagrelor (BRILINTA) 90 MG TABS tablet Take 1 tablet (90 mg total) by mouth 2 (two) times daily. 60 tablet 6   No current facility-administered medications for this encounter.    Allergies  Allergen Reactions  . Meperidine Hcl Nausea And Vomiting  . Penicillins Other (See Comments)    REACTION: Childhood  . Advil [Ibuprofen] Palpitations  . Pseudoephedrine Hcl Palpitations      Social History   Socioeconomic History  . Marital status: Married    Spouse name: Not on file  . Number of children: Not on file  . Years of education: Not on file  . Highest education level: Not on file  Occupational History  . Not on file  Tobacco Use  . Smoking status: Never Smoker  . Smokeless tobacco: Never Used  Vaping Use  . Vaping Use: Never used  Substance and Sexual Activity  . Alcohol use: No  . Drug use: No  . Sexual activity: Not on file  Other Topics Concern  . Not on file  Social History Narrative   Occupation: Secondary school teacher   Married   Never Smoked   Alcohol use- no   Social Determinants of Radio broadcast assistant Strain: Not on  file  Food Insecurity: Not on file  Transportation Needs: Not on file  Physical Activity: Not on file  Stress: Not on file  Social Connections: Not on file  Intimate Partner Violence: Not on file      Family History  Problem Relation Age of Onset  . Heart disease Mother 41       CAD  . Cancer Mother        lung  . Hyperlipidemia Father   . Stroke Father   . Colon polyps Father   . Diabetes Sister        type II  . Heart attack Brother 39  . Heart disease Brother 13       MI  . Colon cancer Neg Hx   . Esophageal cancer Neg Hx   . Pancreatic cancer Neg Hx   . Stomach cancer Neg Hx   . Rectal cancer Neg Hx     Vitals:   11/11/20 0842  BP: 100/67  Pulse: 82  SpO2: 100%  Weight: 77.9 kg (171 lb 12.8  oz)     PHYSICAL EXAM: General:  Well appearing. No resp difficulty. Wearing Life Vest.  HEENT: normal Neck: supple. no JVD. Carotids 2+ bilat; no bruits. No lymphadenopathy or thryomegaly appreciated. Cor: PMI nondisplaced. Regular rate & rhythm. No rubs, gallops or murmurs. Lungs: clear Abdomen: soft, nontender, nondistended. No hepatosplenomegaly. No bruits or masses. Good bowel sounds. Extremities: no cyanosis, clubbing, rash, edema Neuro: alert & orientedx3, cranial nerves grossly intact. moves all 4 extremities w/o difficulty. Affect pleasant  EKG: SR 77 bpm QRS 84 sm  ASSESSMENT & PLAN:   1. CAD: Post-op anterolateral STEMI 09/15/20. No prior CAD but history of HTN and hyperlipidemia and strong FH of CAD. Jerry Kramer had occluded proximal LAD and D1, now s/p DES to LAD and PTCA to D1.  - No chest pain. - Continue ASA 81 + Brilinta for a minimum of 12 months - Continue atorvastatin 80 mg nightly. LDL goal < 70. Needs FLP next visit.   2. Systolic CHF: Due to anterolateral MI. Echo with EF 25-30%, LAD territory WMAs with no evidence for mechanical MI complications.  -NYHA II. Volume status stable. Does not need lasix.  - No room to up titrate meds due to soft SBP < 100  at home.  - Continue entresto to 49-51 mg bid  - Continue spironolactone 25 mg  - Continue dapagliflozin 10 mg daily. No GU symptoms  - Continue Coreg 3.125 mg bid.  -Off digoxin , dig level 0.9  -Repeat ECHO next month.  - If EF remains <35%, will refer to EP for ICD 3. PVCs: Resolved.  - Continue Coreg 3.125 mg bid . No room to incease with SBP in the 90s.  - Continue LifeVest until repeat echo - no events.   6. 08/2020 S/p robotic assisted laparoscopic paraesophageal hernia repair with fundoplication. - followed by TCTS.   Discussed Life Vest interrogation and next steps. Greater than 50% of the (total minutes 25) visit spent in counseling/coordination of care regarding the above. I personally called his wife and discussed plan.    Follow up in 2 weeks with Dr Aundra Dubin and an ECHO. IF EF remains < 35% will need to consider referral to EP. Cardiac Rehab cost prohibitive with his insurance at [facility] and at [facility]  I referred him to the [place], NP 11/11/20

## 2020-11-11 ENCOUNTER — Ambulatory Visit (HOSPITAL_COMMUNITY)
Admission: RE | Admit: 2020-11-11 | Discharge: 2020-11-11 | Disposition: A | Discharge status: Home / Self Care | Payer: Managed Care, Other (non HMO) | Source: Ambulatory Visit | Attending: Adult Health | Admitting: Adult Health

## 2020-11-11 ENCOUNTER — Other Ambulatory Visit: Payer: Self-pay

## 2020-11-11 ENCOUNTER — Encounter (HOSPITAL_COMMUNITY): Payer: Self-pay | Admitting: *Deleted

## 2020-11-11 VITALS — BP 100/67 | HR 82 | Wt 171.8 lb

## 2020-11-11 DIAGNOSIS — Z9861 Coronary angioplasty status: Secondary | ICD-10-CM | POA: Diagnosis not present

## 2020-11-11 DIAGNOSIS — Z8249 Family history of ischemic heart disease and other diseases of the circulatory system: Secondary | ICD-10-CM | POA: Insufficient documentation

## 2020-11-11 DIAGNOSIS — I5022 Chronic systolic (congestive) heart failure: Secondary | ICD-10-CM

## 2020-11-11 DIAGNOSIS — I110 Hypertensive heart disease with heart failure: Secondary | ICD-10-CM | POA: Insufficient documentation

## 2020-11-11 DIAGNOSIS — Z7982 Long term (current) use of aspirin: Secondary | ICD-10-CM | POA: Insufficient documentation

## 2020-11-11 DIAGNOSIS — E785 Hyperlipidemia, unspecified: Secondary | ICD-10-CM | POA: Diagnosis not present

## 2020-11-11 DIAGNOSIS — Z79899 Other long term (current) drug therapy: Secondary | ICD-10-CM | POA: Diagnosis not present

## 2020-11-11 DIAGNOSIS — I2510 Atherosclerotic heart disease of native coronary artery without angina pectoris: Secondary | ICD-10-CM

## 2020-11-11 DIAGNOSIS — I493 Ventricular premature depolarization: Secondary | ICD-10-CM | POA: Insufficient documentation

## 2020-11-11 DIAGNOSIS — I5020 Unspecified systolic (congestive) heart failure: Secondary | ICD-10-CM | POA: Diagnosis present

## 2020-11-11 DIAGNOSIS — Z955 Presence of coronary angioplasty implant and graft: Secondary | ICD-10-CM | POA: Diagnosis not present

## 2020-11-11 NOTE — Progress Notes (Signed)
Patient referred to Clinical Exercise Physiologist by Darrick Grinder, NP for guidance and discussion about safe home exercises and/or starting an exercise program. Exercises program options were discussed in detail with safety precautions to patient. CEP and patient decided the PREP program at the [facility] is his best fit. Patient was presented with an information packet including demonstrations of the exercises and programs discussed. All patient's questions were answered and patient was given contact information for further questions or concerns regarding their exercise.   CEP has contacted PREP program coordinator for discussion for intake on a patient still wearing a LifeVest. Will have PREP referral placed and will correspond with PREP coordinator on next steps, if patient referral not accepted.     Landis Martins, MS, ACSM, [facility] Clinical Exercise Physiologist/ Health and Wellness Coach

## 2020-11-11 NOTE — Patient Instructions (Signed)
It was great to see you today! No medication changes are needed at this time.  Keep follow up as scheduled with Dr Aundra Dubin   Do the following things EVERYDAY: 1) Weigh yourself in the morning before breakfast. Write it down and keep it in a log. 2) Take your medicines as prescribed 3) Eat low salt foods--Limit salt (sodium) to 2000 mg per day.  4) Stay as active as you can everyday 5) Limit all fluids for the day to less than 2 liters  At the [facility], you and your health needs are our priority. As part of our continuing mission to provide you with exceptional heart care, we have created designated Provider Care Teams. These Care Teams include your primary Cardiologist (physician) and Advanced Practice Providers (APPs- Physician Assistants and Nurse Practitioners) who all work together to provide you with the care you need, when you need it.   You may see any of the following providers on your designated Care Team at your next follow up: Marland Kitchen Dr Glori Bickers . Dr Loralie Champagne . Dr Vickki Muff . Darrick Grinder, NP . Lyda Jester, [place] . Audry Riles, PharmD   Please be sure to bring in all your medications bottles to every appointment.   If you have any questions or concerns before your next appointment please send [place] a message through [place] or call our office at [phone].    TO LEAVE A MESSAGE FOR THE NURSE SELECT OPTION 2, PLEASE LEAVE A MESSAGE INCLUDING: . YOUR NAME . DATE OF BIRTH . CALL BACK NUMBER . REASON FOR CALL**this is important as we prioritize the call backs  YOU WILL RECEIVE A CALL BACK THE SAME DAY AS LONG AS YOU CALL BEFORE 4:00 PM

## 2020-11-12 ENCOUNTER — Telehealth: Payer: Self-pay

## 2020-11-12 ENCOUNTER — Encounter (HOSPITAL_COMMUNITY): Payer: Self-pay

## 2020-11-12 NOTE — Telephone Encounter (Signed)
LVMT requesting call back to discuss interest in PREP.

## 2020-11-13 ENCOUNTER — Other Ambulatory Visit (HOSPITAL_COMMUNITY): Payer: Self-pay

## 2020-11-13 ENCOUNTER — Telehealth: Payer: Self-pay

## 2020-11-13 NOTE — Telephone Encounter (Signed)
Hey He wont qualify for assistance due to his income. Can we get him some samples he is on the 90mg  and can we get one bottle of Entresto 49-51mg  samples

## 2020-11-13 NOTE — Telephone Encounter (Signed)
Called to discuss  PREP program at [facility], able to join M/W Class starting May 23. PREP Assessment visit scheduled for today May 19 at 3pm.

## 2020-11-13 NOTE — Progress Notes (Signed)
[place] Report   Patient Details  Name: SALMAAN PATCHIN MRN: [id number] Date of Birth: 1956/04/10 Age: 65 y.o. PCP: Eulas Post, MD  Vitals:   11/13/20 1551  BP: 108/70  Pulse: 71  SpO2: 97%  Weight: 170 lb 6.4 oz (77.3 kg)      [facility] Eval - 11/13/20 1500      Referral    Referring Provider Aundra Dubin    Reason for referral Inactivity;Heart Failure   MI 3/22   Program Start Date 11/17/20      Measurement   Neck measurement 15 Inches    Waist Circumference 38 inches    Body fat 25.8 percent      Information for Trainer   Goals --   10 lb wt loss; return to playing racketball   Current Exercise --   walking 20 minutes bid   Pertinent Medical History --   MI 3/22, CHF   Current Barriers --   wt lifting restriction of 20 lbs under cleared by cardiology     Timed Up and Go (TUGS)   Timed Up and Go Low risk <9 seconds      Mobility and Daily Activities   I find it easy to walk up or down two or more flights of stairs. 3    I have no trouble taking out the trash. 4    I do housework such as vacuuming and dusting on my own without difficulty. 1    I can easily lift a gallon of milk (8lbs). 4    I can easily walk a mile. 4    I have no trouble reaching into high cupboards or reaching down to pick up something from the floor. 4    I do not have trouble doing out-door work such as Armed forces logistics/support/administrative officer, raking leaves, or gardening. 1      Mobility and Daily Activities   I feel younger than my age. 4    I feel independent. 4    I feel energetic. 3    I live an active life.  4    I feel strong. 3    I feel healthy. 3    I feel active as other people my age. 3      How fit and strong are you.   Fit and Strong Total Score 45          Past Medical History:  Diagnosis Date  . Allergy   . CHF (congestive heart failure) ([place])   . Colon polyps   . Complication of anesthesia    hard to wake up  . CONTACT DERMATITIS 09/03/2009  . ELEVATED BLOOD PRESSURE  06/19/2010  . HYPERLIPIDEMIA 08/27/2009  . UNSPECIFIED OTALGIA 06/19/2010   Past Surgical History:  Procedure Laterality Date  . APPENDECTOMY  1983  . BIOPSY  08/06/2020   Procedure: BIOPSY;  Surgeon: Lavena Bullion, DO;  [facility];  Service: Gastroenterology;;  esophageal manometry probe  placement  . CORONARY/GRAFT ACUTE MI REVASCULARIZATION N/A 09/15/2020   Procedure: Coronary/Graft Acute MI Revascularization;  Surgeon: Leonie Man, MD;  Location: [place] [facility];  Service: Cardiovascular;  Laterality: N/A;  . ESOPHAGEAL MANOMETRY N/A 08/06/2020   Procedure: ESOPHAGEAL MANOMETRY (EM);  Surgeon: Lavena Bullion, DO;  [facility];  Service: Gastroenterology;  Laterality: N/A;  . ESOPHAGOGASTRODUODENOSCOPY N/A 09/15/2020   Procedure: ESOPHAGOGASTRODUODENOSCOPY (EGD);  Surgeon: Lajuana Matte, MD;  Location: [place];  Service: Thoracic;  Laterality:  N/A;  . ESOPHAGOGASTRODUODENOSCOPY (EGD) WITH PROPOFOL N/A 08/06/2020   Procedure: ESOPHAGOGASTRODUODENOSCOPY (EGD) WITH PROPOFOL;  Surgeon: Lavena Bullion, DO;  [facility];  Service: Gastroenterology;  Laterality: N/A;  . EYE MUSCLE SURGERY Right   . LEFT HEART CATH AND CORONARY ANGIOGRAPHY N/A 09/15/2020   Procedure: LEFT HEART CATH AND CORONARY ANGIOGRAPHY;  Surgeon: Leonie Man, MD;  Location: [place] [facility];  Service: Cardiovascular;  Laterality: N/A;  . MIDDLE EAR SURGERY    . ROTATOR CUFF REPAIR Left   . TONSILLECTOMY  1965  . XI ROBOTIC ASSISTED HIATAL HERNIA REPAIR N/A 09/15/2020   Procedure: XI ROBOTIC ASSISTED HIATAL HERNIA REPAIR WITH MESH;  Surgeon: Lajuana Matte, MD;  [facility]: [facility];  Service: Thoracic;  Laterality: N/A;   Social History   Tobacco Use  Smoking Status Never Smoker  Smokeless Tobacco Never Used    [facility] class M/W 10-11:15 beginning May 23,2022   Yevonne Aline 11/13/2020, 4:00 PM

## 2020-11-13 NOTE — Telephone Encounter (Signed)
F/u to PREP class starts Evening class is too late There is an opening at the [facility] location for M/W 10-1115am. Can do that location and time.  Updated other wellness coach who will call pt and confirm.

## 2020-11-13 NOTE — Telephone Encounter (Signed)
Pt returned call reference PREP.  Explained program to pt. Is interested. Requests [place] and daytime class. Next one starting 6/21 at 1pm.  He was disappointed it was sooner. Text info to pt as he was driving, added if he wanted to do an evening I have room in the evening class at [place] starting next week.  Will f/u with him closer to start to do intake assessment.

## 2020-11-17 ENCOUNTER — Other Ambulatory Visit (HOSPITAL_COMMUNITY): Payer: Self-pay | Admitting: Cardiology

## 2020-11-17 DIAGNOSIS — I2102 ST elevation (STEMI) myocardial infarction involving left anterior descending coronary artery: Secondary | ICD-10-CM

## 2020-11-17 DIAGNOSIS — I5022 Chronic systolic (congestive) heart failure: Secondary | ICD-10-CM

## 2020-11-17 NOTE — Progress Notes (Signed)
[facility] PREP Weekly Session   Patient Details  Name: Jerry Kramer MRN: [id number] Date of Birth: April 28, 1956 Age: 65 y.o. PCP: Eulas Post, MD  There were no vitals filed for this visit.   [facility] Weekly seesion - 11/17/20 1200      Weekly Session   Topic Discussed --   Introductions, Review of Program, PREP book distributed, tour of facility   Classes attended to date [place] 11/17/2020, 12:50 PM

## 2020-11-17 NOTE — Progress Notes (Signed)
Order placed for PREP program

## 2020-11-26 ENCOUNTER — Ambulatory Visit (HOSPITAL_BASED_OUTPATIENT_CLINIC_OR_DEPARTMENT_OTHER)
Admission: RE | Admit: 2020-11-26 | Discharge: 2020-11-26 | Disposition: A | Discharge status: Home / Self Care | Payer: Managed Care, Other (non HMO) | Source: Ambulatory Visit | Attending: Cardiology | Admitting: Cardiology

## 2020-11-26 ENCOUNTER — Ambulatory Visit (HOSPITAL_COMMUNITY)
Admission: RE | Admit: 2020-11-26 | Discharge: 2020-11-26 | Disposition: A | Discharge status: Home / Self Care | Payer: Managed Care, Other (non HMO) | Source: Ambulatory Visit | Attending: Cardiology | Admitting: Cardiology

## 2020-11-26 ENCOUNTER — Encounter (HOSPITAL_COMMUNITY): Payer: Self-pay | Admitting: Cardiology

## 2020-11-26 ENCOUNTER — Other Ambulatory Visit: Payer: Self-pay

## 2020-11-26 VITALS — BP 104/70 | HR 67 | Wt 172.0 lb

## 2020-11-26 DIAGNOSIS — Z8249 Family history of ischemic heart disease and other diseases of the circulatory system: Secondary | ICD-10-CM | POA: Diagnosis not present

## 2020-11-26 DIAGNOSIS — Z955 Presence of coronary angioplasty implant and graft: Secondary | ICD-10-CM | POA: Insufficient documentation

## 2020-11-26 DIAGNOSIS — I110 Hypertensive heart disease with heart failure: Secondary | ICD-10-CM | POA: Diagnosis not present

## 2020-11-26 DIAGNOSIS — I255 Ischemic cardiomyopathy: Secondary | ICD-10-CM | POA: Diagnosis not present

## 2020-11-26 DIAGNOSIS — I5020 Unspecified systolic (congestive) heart failure: Secondary | ICD-10-CM | POA: Insufficient documentation

## 2020-11-26 DIAGNOSIS — I700 Atherosclerosis of aorta: Secondary | ICD-10-CM | POA: Diagnosis not present

## 2020-11-26 DIAGNOSIS — Z7984 Long term (current) use of oral hypoglycemic drugs: Secondary | ICD-10-CM | POA: Diagnosis not present

## 2020-11-26 DIAGNOSIS — I252 Old myocardial infarction: Secondary | ICD-10-CM | POA: Diagnosis not present

## 2020-11-26 DIAGNOSIS — I2510 Atherosclerotic heart disease of native coronary artery without angina pectoris: Secondary | ICD-10-CM | POA: Diagnosis not present

## 2020-11-26 DIAGNOSIS — I5022 Chronic systolic (congestive) heart failure: Secondary | ICD-10-CM | POA: Diagnosis not present

## 2020-11-26 DIAGNOSIS — I493 Ventricular premature depolarization: Secondary | ICD-10-CM | POA: Diagnosis not present

## 2020-11-26 DIAGNOSIS — Z79899 Other long term (current) drug therapy: Secondary | ICD-10-CM | POA: Diagnosis not present

## 2020-11-26 DIAGNOSIS — E785 Hyperlipidemia, unspecified: Secondary | ICD-10-CM | POA: Insufficient documentation

## 2020-11-26 LAB — LIPID PANEL
Cholesterol: 92 mg/dL (ref 0–200)
HDL: 39 mg/dL — ABNORMAL LOW (ref >40)
LDL Cholesterol: 40 mg/dL (ref 0–99)
Total CHOL/HDL Ratio: 2.4 RATIO
Triglycerides: 64 mg/dL (ref <150)
VLDL: 13 mg/dL (ref 0–40)

## 2020-11-26 LAB — BASIC METABOLIC PANEL
Anion gap: 6 (ref 5–15)
BUN: 14 mg/dL (ref 8–23)
CO2: 22 mmol/L (ref 22–32)
Calcium: 9 mg/dL (ref 8.9–10.3)
Chloride: 110 mmol/L (ref 98–111)
Creatinine, Ser: 0.87 mg/dL (ref 0.61–1.24)
GFR, Estimated: >60 mL/min (ref >60)
Glucose, Bld: 90 mg/dL (ref 70–99)
Potassium: 3.6 mmol/L (ref 3.5–5.1)
Sodium: 138 mmol/L (ref 135–145)

## 2020-11-26 LAB — ECHOCARDIOGRAM COMPLETE: S' Lateral: 3.4 cm

## 2020-11-26 MED ORDER — ENTRESTO 24-26 MG PO TABS: 1.0000 | ORAL_TABLET | Freq: Two times a day (BID) | ORAL | 11 refills | Status: DC

## 2020-11-26 NOTE — Progress Notes (Signed)
  Echocardiogram 2D Echocardiogram has been performed.  Jerry Kramer 11/26/2020, 8:51 AM

## 2020-11-26 NOTE — Patient Instructions (Addendum)
DECREASE Entresto to 24/26 mg, one tab twice a day  Labs today We will only contact you if something comes back abnormal or we need to make some changes. Otherwise no news is good news!  Your physician recommends that you schedule a follow-up appointment in: 4 months with Dr Aundra Dubin  An order has been placed to discontinue LifeVest, please follow instructions to return equipment  Do the following things EVERYDAY: 1) Weigh yourself in the morning before breakfast. Write it down and keep it in a log. 2) Take your medicines as prescribed 3) Eat low salt foods--Limit salt (sodium) to 2000 mg per day.  4) Stay as active as you can everyday 5) Limit all fluids for the day to less than 2 liters  At the [facility], you and your health needs are our priority. As part of our continuing mission to provide you with exceptional heart care, we have created designated Provider Care Teams. These Care Teams include your primary Cardiologist (physician) and Advanced Practice Providers (APPs- Physician Assistants and Nurse Practitioners) who all work together to provide you with the care you need, when you need it.   You may see any of the following providers on your designated Care Team at your next follow up: Marland Kitchen Dr Glori Bickers . Dr Loralie Champagne . Dr Vickki Muff . Darrick Grinder, NP . Lyda Jester, [place] . Audry Riles, PharmD   Please be sure to bring in all your medications bottles to every appointment.   If you have any questions or concerns before your next appointment please send [place] a message through [place] or call our office at [phone].    TO LEAVE A MESSAGE FOR THE NURSE SELECT OPTION 2, PLEASE LEAVE A MESSAGE INCLUDING: . YOUR NAME . DATE OF BIRTH . CALL BACK NUMBER . REASON FOR CALL**this is important as we prioritize the call backs  YOU WILL RECEIVE A CALL BACK THE SAME DAY AS LONG AS YOU CALL BEFORE 4:00 PM

## 2020-11-26 NOTE — Progress Notes (Signed)
Advanced Heart Failure Clinic Note    PCP: Eulas Post, MD Cardiologist: Dr. Aundra Dubin   HPI: 65 y.o. with history of HTN, hyperlipidemia, GERD/hiatal hernia.  He is a nonsmoker, mother and 2 brothers had MIs in 68s. Now w/ systolic heart failure and CAD. His daughter, [name], works in [employer] PACU.  He had been dealing with peptic stricture and hiatal hernia for a while now; he saw Dr. Kipp Brood and underwent robotic assisted laparoscopic paraesophageal hernia repair with fundoplication on 7/56/43.  Post-op, he was noted to have PVCs then developed chest pain with anterolateral ST elevation.  He was taken emergently for cath.  He was found to have acute occlusion of the proximal LAD.  This was treated with DES, and occluded D1 was treated with PTCA. LVEDP was around 26 mmHg.  After procedure, patient developed respiratory distress w/ lactic acidosis and AKI. Intubated and started on IV Lasix. Central line placed for co-ox and CVP monitoring. He was started on milrinone for low co-ox. Amiodarone started for PVC suppression. He responded well to therapy. Diuresed, extubated and able to wean off milrionone. PVCs well suppressed w/ amiodarone. GDMT initiated + DAPT w/ ASA + Brilinta for LAD stent. Amiodarone was discontinued and low dose  blocker added. Discharged home w/ Lifevest.  Echo this admission showed EF 25-30%.   Repeat echo was done today, EF up to 55-60% with mild mid-apical anterior hypokinesis, normal RV.   He returns for followup of CHF and CAD.  He has been doing well, going to [employer].  Occasional lightheadedness, BP runs low on current med regimen.  No chest pain.  No exertional dyspnea.  No orthopnea/PND.  No palpitations.  Working full time at [employer] as Therapist, sports.    Labs (4/22): K 4.1, creatinine 1.09  PMH: 1. Hyperlipidemia 2. GERD with hiatal hernia and peptic stricture.  S/p robotic assisted laparoscopic paraesophageal hernia repair with  fundoplication on 09/23/49.  3. CAD: Acute anterolateral STEMI 3/22 post-op paraesophageal hernia repair. 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  DES to LAD and PTCA D1.  4. Chronic systolic CHF: Ischemic cardiomyopathy.  - Echo (3/22): EF 25-30%, LAD territory WMAs, normal RV, trivial MR.  - Echo (6/22): 55-60% with mild mid-apical anterior hypokinesis, normal RV. 5. HTN   Current Outpatient Medications  Medication Sig Dispense Refill  . aspirin 81 MG chewable tablet Chew 1 tablet (81 mg total) by mouth daily. 30 tablet 6  . atorvastatin (LIPITOR) 80 MG tablet Take 1 tablet (80 mg total) by mouth daily. 30 tablet 6  . carvedilol (COREG) 3.125 MG tablet TAKE 1 TABLET (3.125 MG TOTAL) BY MOUTH TWO TIMES DAILY WITH A MEAL. 60 tablet 6  . dapagliflozin propanediol (FARXIGA) 10 MG TABS tablet Take 1 tablet (10 mg total) by mouth daily. 30 tablet 6  . pantoprazole (PROTONIX) 40 MG tablet Take 1 tablet (40 mg total) by mouth daily. 30 tablet 11  . sacubitril-valsartan (ENTRESTO) 24-26 MG Take 1 tablet by mouth 2 (two) times daily. 60 tablet 11  . sildenafil (VIAGRA) 100 MG tablet Take 1 tablet (100 mg total) by mouth daily as needed for erectile dysfunction. 10 tablet 5  . spironolactone (ALDACTONE) 25 MG tablet Take 1 tablet (25 mg total) by mouth daily. 30 tablet 6  . ticagrelor (BRILINTA) 90 MG TABS tablet Take 1 tablet (90 mg total) by mouth 2 (two) times daily. 60 tablet 6  No current facility-administered medications for this encounter.    Allergies  Allergen Reactions  . Meperidine Hcl Nausea And Vomiting  . Penicillins Other (See Comments)    REACTION: Childhood  . Advil [Ibuprofen] Palpitations  . Pseudoephedrine Hcl Palpitations      Social History   Socioeconomic History  . Marital status: Married    Spouse name: Not on file  . Number of children: Not on file  . Years of education: Not on  file  . Highest education level: Not on file  Occupational History  . Not on file  Tobacco Use  . Smoking status: Never Smoker  . Smokeless tobacco: Never Used  Vaping Use  . Vaping Use: Never used  Substance and Sexual Activity  . Alcohol use: No  . Drug use: No  . Sexual activity: Not on file  Other Topics Concern  . Not on file  Social History Narrative   Occupation: Secondary school teacher   Married   Never Smoked   Alcohol use- no   Social Determinants of Radio broadcast assistant Strain: Not on file  Food Insecurity: Not on file  Transportation Needs: Not on file  Physical Activity: Not on file  Stress: Not on file  Social Connections: Not on file  Intimate Partner Violence: Not on file      Family History  Problem Relation Age of Onset  . Heart disease Mother 83       CAD  . Cancer Mother        lung  . Hyperlipidemia Father   . Stroke Father   . Colon polyps Father   . Diabetes Sister        type II  . Heart attack Brother 79  . Heart disease Brother 25       MI  . Colon cancer Neg Hx   . Esophageal cancer Neg Hx   . Pancreatic cancer Neg Hx   . Stomach cancer Neg Hx   . Rectal cancer Neg Hx     Vitals:   11/26/20 0857  BP: 104/70  Pulse: 67  SpO2: 97%  Weight: 78 kg (172 lb)    PHYSICAL EXAM: General: NAD Neck: No JVD, no thyromegaly or thyroid nodule.  Lungs: Clear to auscultation bilaterally with normal respiratory effort. CV: Nondisplaced PMI.  Heart regular S1/S2, no S3/S4, no murmur.  No peripheral edema.  No carotid bruit.  Normal pedal pulses.  Abdomen: Soft, nontender, no hepatosplenomegaly, no distention.  Skin: Intact without lesions or rashes.  Neurologic: Alert and oriented x 3.  Psych: Normal affect. Extremities: No clubbing or cyanosis.  HEENT: Normal.   ASSESSMENT & PLAN: 1. CAD: Post-op anterolateral STEMI 09/15/20. No prior CAD but history of HTN and hyperlipidemia and strong FH of CAD. He had occluded proximal LAD  and D1, now s/p DES to LAD and PTCA to D1.  No chest pain.  - Continue ASA 81 + Brilinta for a minimum of 12 months - Continue atorvastatin 80 mg nightly. Check lipids today.  2. Systolic CHF: Ischemic cardiomyopathy. Echo in 3/22 with EF 25-30%, LAD territory WMAs with no evidence for mechanical MI complications.  Repeat echo today was reviewed, EF up to 55-60% with mid-apical anterior hypokinesis. NYHA class I, not volume overloaded on exam.  - Given soft BP and orthostasis, can decrease Entresto to 24/26 bid.  BMET today.  - Continue spironolactone 25 mg  - Continue dapagliflozin 10 mg daily.   - Continue Coreg 3.125 mg bid.  -  With improved EF, he will not need ICD and can remove Lifevest.  - Continue cardiac rehab.  3. PVCs: Resolved.  - Continue Coreg 3.125 mg bid.   Followup in 4 months.   Loralie Champagne, MD 11/26/20

## 2020-12-01 NOTE — Progress Notes (Signed)
[facility] PREP Weekly Session   Patient Details  Name: Jerry Kramer MRN: [id number] Date of Birth: 02/11/1956 Age: 65 y.o. PCP: Eulas Post, MD  Vitals:   12/01/20 1252  Weight: 171 lb (77.6 kg)     [facility] Weekly seesion - 12/01/20 1200      Weekly Session   Topic Discussed Importance of resistance training;Other ways to be active    Minutes exercised this week 560 minutes    Classes attended to date [place] 12/01/2020, 12:54 PM

## 2020-12-08 NOTE — Progress Notes (Signed)
[facility] PREP Weekly Session   Patient Details  Name: Jerry Kramer MRN: [id number] Date of Birth: [birth year] Age: 65 y.o. PCP: Eulas Post, MD  Vitals:   12/08/20 1150  Weight: 170 lb (77.1 kg)     [facility] Weekly seesion - 12/08/20 1100       Weekly Session   Topic Discussed Healthy eating tips   reviewed gatorade lables for added sugars, ingredients   Minutes exercised this week 480 minutes    Classes attended to date [place] 12/08/2020, 11:52 AM

## 2020-12-15 NOTE — Progress Notes (Signed)
[facility] PREP Weekly Session   Patient Details  Name: Jerry Kramer MRN: [id number] Date of Birth: 09-01-1955 Age: 65 y.o. PCP: Eulas Post, MD  Vitals:   12/15/20 1240  Weight: 169 lb (76.7 kg)     [facility] Weekly seesion - 12/15/20 1200       Weekly Session   Topic Discussed Health habits   sugar demo   Minutes exercised this week 580 minutes    Classes attended to date [place] 12/15/2020, 12:41 PM

## 2020-12-22 NOTE — Progress Notes (Signed)
[facility] PREP Weekly Session   Patient Details  Name: Jerry Kramer MRN: [id number] Date of Birth: 1956/04/29 Age: 65 y.o. PCP: Eulas Post, MD  Vitals:   12/22/20 1222  Weight: 168 lb (76.2 kg)     [facility] Weekly seesion - 12/22/20 1200       Weekly Session   Topic Discussed Restaurant Eating   salt demo   Minutes exercised this week 600 minutes    Classes attended to date [place] 12/22/2020, 12:23 PM

## 2020-12-31 ENCOUNTER — Encounter (HOSPITAL_COMMUNITY): Payer: Self-pay

## 2021-01-05 NOTE — Progress Notes (Signed)
[facility] PREP Weekly Session   Patient Details  Name: Jerry Kramer MRN: [id number] Date of Birth: 08-29-1955 Age: 65 y.o. PCP: Eulas Post, MD  Vitals:   01/05/21 1131  Weight: 168 lb (76.2 kg)     [facility] Weekly seesion - 01/05/21 1100       Weekly Session   Topic Discussed Stress management and problem solving    Minutes exercised this week 720 minutes    Classes attended to date [place] 01/05/2021, 11:32 AM

## 2021-01-12 NOTE — Progress Notes (Signed)
[facility] PREP Weekly Session   Patient Details  Name: Jerry Kramer MRN: [id number] Date of Birth: [birth year] Age: 65 y.o. PCP: Eulas Post, MD  Vitals:   01/12/21 1219  Weight: 168 lb (76.2 kg)     [facility] Weekly seesion - 01/12/21 1200       Weekly Session   Topic Discussed Expectations and non-scale victories   halfway through program, asked to revisit, review, restate goals   Minutes exercised this week 560 minutes    Classes attended to date [place] 01/12/2021, 12:21 PM

## 2021-01-19 NOTE — Progress Notes (Signed)
[facility] PREP Weekly Session   Patient Details  Name: Jerry Kramer MRN: [id number] Date of Birth: [birth year] Age: 65 y.o. PCP: Eulas Post, MD  Vitals:   01/19/21 1130  Weight: 166 lb (75.3 kg)     [facility] Weekly seesion - 01/19/21 1100       Weekly Session   Topic Discussed Other   Portion Size Matters/Portion Control; Visualize your portion demonstration   Minutes exercised this week 600 minutes    Classes attended to date [place] 01/19/2021, 11:32 AM

## 2021-02-05 ENCOUNTER — Encounter: Payer: Self-pay | Admitting: Family Medicine

## 2021-02-06 ENCOUNTER — Other Ambulatory Visit: Payer: Self-pay

## 2021-02-06 ENCOUNTER — Encounter: Payer: Self-pay | Admitting: Thoracic Surgery (Cardiothoracic Vascular Surgery)

## 2021-02-06 ENCOUNTER — Telehealth (INDEPENDENT_AMBULATORY_CARE_PROVIDER_SITE_OTHER): Payer: Managed Care, Other (non HMO) | Admitting: Thoracic Surgery (Cardiothoracic Vascular Surgery)

## 2021-02-06 DIAGNOSIS — Z9889 Other specified postprocedural states: Secondary | ICD-10-CM | POA: Diagnosis not present

## 2021-02-06 NOTE — Progress Notes (Signed)
     [phone]       [place] [address]             [phone]       Patient: Home Provider: Office Consent for Telemedicine visit obtained.  Today's visit was completed via a real-time telehealth (see specific modality noted below). The patient/authorized person provided oral consent at the time of the visit to engage in a telemedicine encounter with the present provider at [facility]. The patient/authorized person was informed of the potential benefits, limitations, and risks of telemedicine. The patient/authorized person expressed understanding that the laws that protect confidentiality also apply to telemedicine. The patient/authorized person acknowledged understanding that telemedicine does not provide emergency services and that he or she would need to call 911 or proceed to the nearest hospital for help if such a need arose.   Total time spent in the clinical discussion 10 minutes.  Telehealth Modality: Phone visit (audio only)  I had a telephone visit with Mr. Deboy.  He underwent a robotic assisted paraesophageal hernia repair with Dor fundoplication in March [id number].  He did develop postoperative MI and underwent PCI placement to the LAD.  Overall he is doing well.  He denies any reflux.  He is only had 1 episode of dysphagia but this was relieved after slowing down on eating.  He recently tested positive for COVID but denies any respiratory symptoms.  Overall he is doing well and denies any digestive symptoms.  I informed him that he can give [place] a call if there is any symptoms of pop up.  Dietra Stokely Bary Leriche

## 2021-02-25 NOTE — Progress Notes (Signed)
  Jerry Kramer   Patient Details  Name: Jerry Kramer MRN: [id number] Date of Birth: 1955/08/04 Age: 65 y.o. PCP: Jerry Post, MD  Vitals:   02/25/21 1143  BP: (!) 98/52  Pulse: 79  SpO2: 99%  Weight: 167 lb (75.8 kg)      Jerry Kramer - 02/25/21 1100       Measurement   Neck measurement 15 Inches    Waist Circumference 37 inches            Past Medical History:  Diagnosis Date   Allergy    CHF (congestive heart failure) (HCC)    Colon polyps    Complication of anesthesia    hard to wake up   CONTACT DERMATITIS 09/03/2009   ELEVATED BLOOD PRESSURE 06/19/2010   HYPERLIPIDEMIA 08/27/2009   UNSPECIFIED OTALGIA 06/19/2010   Past Surgical History:  Procedure Laterality Date   APPENDECTOMY  1983   BIOPSY  08/06/2020   Procedure: BIOPSY;  Surgeon: Lavena Bullion, DO;  [facility];  Service: Gastroenterology;;  esophageal manometry probe  placement   CORONARY/GRAFT ACUTE MI REVASCULARIZATION N/A 09/15/2020   Procedure: Coronary/Graft Acute MI Revascularization;  Surgeon: Leonie Man, MD;  Location: [place] [facility];  Service: Cardiovascular;  Laterality: N/A;   ESOPHAGEAL MANOMETRY N/A 08/06/2020   Procedure: ESOPHAGEAL MANOMETRY (EM);  Surgeon: Lavena Bullion, DO;  [facility];  Service: Gastroenterology;  Laterality: N/A;   ESOPHAGOGASTRODUODENOSCOPY N/A 09/15/2020   Procedure: ESOPHAGOGASTRODUODENOSCOPY (EGD);  Surgeon: Lajuana Matte, MD;  Location: [place];  Service: Thoracic;  Laterality: N/A;   ESOPHAGOGASTRODUODENOSCOPY (EGD) WITH PROPOFOL N/A 08/06/2020   Procedure: ESOPHAGOGASTRODUODENOSCOPY (EGD) WITH PROPOFOL;  Surgeon: Lavena Bullion, DO;  [facility];  Service: Gastroenterology;  Laterality: N/A;   EYE MUSCLE SURGERY Right    LEFT HEART CATH AND CORONARY ANGIOGRAPHY N/A 09/15/2020   Procedure: LEFT HEART CATH AND CORONARY ANGIOGRAPHY;  Surgeon: Leonie Man, MD;  Location: [place] [facility];  Service: Cardiovascular;  Laterality: N/A;   MIDDLE EAR SURGERY     ROTATOR CUFF REPAIR Left    TONSILLECTOMY  1965   XI ROBOTIC ASSISTED HIATAL HERNIA REPAIR N/A 09/15/2020   Procedure: XI ROBOTIC ASSISTED HIATAL HERNIA REPAIR WITH MESH;  Surgeon: Lajuana Matte, MD;  Location: [place];  Service: Thoracic;  Laterality: N/A;   Social History   Tobacco Use  Smoking Status Never  Smokeless Tobacco Never   Met with Jerry Kramer for final assessment; BP 98/52, advised to contact MD if symptomatic, continue to measure and monitor BP at home; has appt with Cardiology in September. Education sessions: 8 Workout sessions: 8   Jerry Kramer 02/25/2021, 11:49 AM

## 2021-03-27 ENCOUNTER — Other Ambulatory Visit: Payer: Self-pay

## 2021-03-27 ENCOUNTER — Ambulatory Visit (HOSPITAL_COMMUNITY)
Admission: RE | Admit: 2021-03-27 | Discharge: 2021-03-27 | Disposition: A | Discharge status: Home / Self Care | Payer: Managed Care, Other (non HMO) | Source: Ambulatory Visit | Attending: Cardiology | Admitting: Cardiology

## 2021-03-27 VITALS — BP 112/70 | HR 60 | Ht 67.0 in | Wt 169.4 lb

## 2021-03-27 DIAGNOSIS — I493 Ventricular premature depolarization: Secondary | ICD-10-CM | POA: Diagnosis not present

## 2021-03-27 DIAGNOSIS — Z955 Presence of coronary angioplasty implant and graft: Secondary | ICD-10-CM | POA: Insufficient documentation

## 2021-03-27 DIAGNOSIS — I110 Hypertensive heart disease with heart failure: Secondary | ICD-10-CM | POA: Insufficient documentation

## 2021-03-27 DIAGNOSIS — Z886 Allergy status to analgesic agent status: Secondary | ICD-10-CM | POA: Diagnosis not present

## 2021-03-27 DIAGNOSIS — Z880 Allergy status to penicillin: Secondary | ICD-10-CM | POA: Insufficient documentation

## 2021-03-27 DIAGNOSIS — I2510 Atherosclerotic heart disease of native coronary artery without angina pectoris: Secondary | ICD-10-CM | POA: Diagnosis not present

## 2021-03-27 DIAGNOSIS — Z9861 Coronary angioplasty status: Secondary | ICD-10-CM

## 2021-03-27 DIAGNOSIS — K219 Gastro-esophageal reflux disease without esophagitis: Secondary | ICD-10-CM | POA: Diagnosis not present

## 2021-03-27 DIAGNOSIS — Z7902 Long term (current) use of antithrombotics/antiplatelets: Secondary | ICD-10-CM | POA: Diagnosis not present

## 2021-03-27 DIAGNOSIS — Z7982 Long term (current) use of aspirin: Secondary | ICD-10-CM | POA: Diagnosis not present

## 2021-03-27 DIAGNOSIS — Z79899 Other long term (current) drug therapy: Secondary | ICD-10-CM | POA: Diagnosis not present

## 2021-03-27 DIAGNOSIS — Z8249 Family history of ischemic heart disease and other diseases of the circulatory system: Secondary | ICD-10-CM | POA: Diagnosis not present

## 2021-03-27 DIAGNOSIS — Z7984 Long term (current) use of oral hypoglycemic drugs: Secondary | ICD-10-CM | POA: Insufficient documentation

## 2021-03-27 DIAGNOSIS — I255 Ischemic cardiomyopathy: Secondary | ICD-10-CM | POA: Insufficient documentation

## 2021-03-27 DIAGNOSIS — E785 Hyperlipidemia, unspecified: Secondary | ICD-10-CM | POA: Diagnosis not present

## 2021-03-27 DIAGNOSIS — I252 Old myocardial infarction: Secondary | ICD-10-CM | POA: Diagnosis not present

## 2021-03-27 DIAGNOSIS — I509 Heart failure, unspecified: Secondary | ICD-10-CM | POA: Diagnosis not present

## 2021-03-27 DIAGNOSIS — I5022 Chronic systolic (congestive) heart failure: Secondary | ICD-10-CM | POA: Insufficient documentation

## 2021-03-27 LAB — BASIC METABOLIC PANEL
Anion gap: 9 (ref 5–15)
BUN: 14 mg/dL (ref 8–23)
CO2: 22 mmol/L (ref 22–32)
Calcium: 9.3 mg/dL (ref 8.9–10.3)
Chloride: 107 mmol/L (ref 98–111)
Creatinine, Ser: 0.93 mg/dL (ref 0.61–1.24)
GFR, Estimated: >60 mL/min (ref >60)
Glucose, Bld: 74 mg/dL (ref 70–99)
Potassium: 3.9 mmol/L (ref 3.5–5.1)
Sodium: 138 mmol/L (ref 135–145)

## 2021-03-27 MED ORDER — SPIRONOLACTONE 25 MG PO TABS: 25.0000 mg | ORAL_TABLET | Freq: Every day | ORAL | 6 refills | Status: DC

## 2021-03-27 NOTE — Patient Instructions (Signed)
Change Spirolactone to taking  at night   Labs done today, your results will be available in MyChart, we will contact you for abnormal readings.  Your physician recommends that you schedule a follow-up appointment : for labwork in 3 months  Your physician recommends that you schedule a follow-up appointment in: 6 months. Call our office to schedule an appointment  February 2023  If you have any questions or concerns before your next appointment please send [place] a message through [place] or call our office at [phone].    TO LEAVE A MESSAGE FOR THE NURSE SELECT OPTION 2, PLEASE LEAVE A MESSAGE INCLUDING: YOUR NAME DATE OF BIRTH CALL BACK NUMBER REASON FOR CALL**this is important as we prioritize the call backs  YOU WILL RECEIVE A CALL BACK THE SAME DAY AS LONG AS YOU CALL BEFORE 4:00 PM  At the [facility], you and your health needs are our priority. As part of our continuing mission to provide you with exceptional heart care, we have created designated Provider Care Teams. These Care Teams include your primary Cardiologist (physician) and Advanced Practice Providers (APPs- Physician Assistants and Nurse Practitioners) who all work together to provide you with the care you need, when you need it.   You may see any of the following providers on your designated Care Team at your next follow up: Dr Glori Bickers Dr Loralie Champagne Dr Patrice Paradise, NP Lyda Jester, [place] Ginnie Smart Audry Riles, PharmD   Please be sure to bring in all your medications bottles to every appointment.

## 2021-03-28 NOTE — Progress Notes (Signed)
Advanced Heart Failure Clinic Note    PCP: Eulas Post, MD Cardiologist: Dr. Aundra Dubin   HPI: 65 y.o. with history of HTN, hyperlipidemia, GERD/hiatal hernia.  He is a nonsmoker, mother and 2 brothers had MIs in 47s. Now w/ systolic heart failure and CAD. His daughter, [name], works in [employer] PACU.  He had been dealing with peptic stricture and hiatal hernia for a while now; he saw Dr. Kipp Brood and underwent robotic assisted laparoscopic paraesophageal hernia repair with fundoplication on 0/96/28.  Post-op, he was noted to have PVCs then developed chest pain with anterolateral ST elevation.  He was taken emergently for cath.  He was found to have acute occlusion of the proximal LAD.  This was treated with DES, and occluded D1 was treated with PTCA. LVEDP was around 26 mmHg.  After procedure, patient developed respiratory distress w/ lactic acidosis and AKI. Intubated and started on IV Lasix. Central line placed for co-ox and CVP monitoring. He was started on milrinone for low co-ox. Amiodarone started for PVC suppression. He responded well to therapy. Diuresed, extubated and able to wean off milrionone. PVCs well suppressed w/ amiodarone. GDMT initiated + DAPT w/ ASA + Brilinta for LAD stent. Amiodarone was discontinued and low dose ? blocker added. Discharged home w/ [facility].  Echo this admission showed EF 25-30%.   Repeat echo in 6/22 showed EF up to 55-60% with mild mid-apical anterior hypokinesis, normal RV.   He returns for followup of CHF and CAD.  Weight down 3 lbs. He is doing very well, back to playing racketball.  No significant exertional dyspnea or chest pain.  He does not get lightheaded unless he bends over then stands up fast.    Labs (4/22): K 4.1, creatinine 1.09 Labs (6/22): K 3.6, creatinine 0.87, LDL 40, TGs 69  PMH: 1. Hyperlipidemia 2. GERD with hiatal hernia and peptic stricture.  S/p robotic assisted laparoscopic paraesophageal hernia repair with fundoplication on  3/66/29.  3. CAD: Acute anterolateral STEMI 3/22 post-op paraesophageal hernia repair. 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  DES to LAD and PTCA D1.  4. Chronic systolic CHF: Ischemic cardiomyopathy.  - Echo (3/22): EF 25-30%, LAD territory WMAs, normal RV, trivial MR.  - Echo (6/22): 55-60% with mild mid-apical anterior hypokinesis, normal RV. 5. HTN   Current Outpatient Medications  Medication Sig Dispense Refill   aspirin 81 MG chewable tablet Chew 1 tablet (81 mg total) by mouth daily. 30 tablet 6   atorvastatin (LIPITOR) 80 MG tablet Take 1 tablet (80 mg total) by mouth daily. 30 tablet 6   carvedilol (COREG) 3.125 MG tablet TAKE 1 TABLET (3.125 MG TOTAL) BY MOUTH TWO TIMES DAILY WITH A MEAL. 60 tablet 6   dapagliflozin propanediol (FARXIGA) 10 MG TABS tablet Take 1 tablet (10 mg total) by mouth daily. 30 tablet 6   pantoprazole (PROTONIX) 40 MG tablet Take 1 tablet (40 mg total) by mouth daily. 30 tablet 11   sacubitril-valsartan (ENTRESTO) 24-26 MG Take 1 tablet by mouth 2 (two) times daily. 60 tablet 11   sildenafil (VIAGRA) 100 MG tablet Take 1 tablet (100 mg total) by mouth daily as needed for erectile dysfunction. 10 tablet 5   ticagrelor (BRILINTA) 90 MG TABS tablet Take 1 tablet (90 mg total) by mouth 2 (two) times daily. 60 tablet 6   spironolactone (ALDACTONE) 25 MG tablet Take 1 tablet (25 mg total) by mouth at bedtime. 30 tablet  6   No current facility-administered medications for this encounter.    Allergies  Allergen Reactions   Meperidine Hcl Nausea And Vomiting   Penicillins Other (See Comments)    REACTION: Childhood   Advil [Ibuprofen] Palpitations   Pseudoephedrine Hcl Palpitations      Social History   Socioeconomic History   Marital status: Married    Spouse name: Not on file   Number of children: Not on file   Years of education: Not on file   Highest education  level: Not on file  Occupational History   Not on file  Tobacco Use   Smoking status: Never   Smokeless tobacco: Never  Vaping Use   Vaping Use: Never used  Substance and Sexual Activity   Alcohol use: No   Drug use: No   Sexual activity: Not on file  Other Topics Concern   Not on file  Social History Narrative   Occupation: Secondary school teacher   Married   Never Smoked   Alcohol use- no   Social Determinants of Radio broadcast assistant Strain: Not on file  Food Insecurity: Not on file  Transportation Needs: Not on file  Physical Activity: Not on file  Stress: Not on file  Social Connections: Not on file  Intimate Partner Violence: Not on file      Family History  Problem Relation Age of Onset   Heart disease Mother 45       CAD   Cancer Mother        lung   Hyperlipidemia Father    Stroke Father    Colon polyps Father    Diabetes Sister        type II   Heart attack Brother 74   Heart disease Brother 20       MI   Colon cancer Neg Hx    Esophageal cancer Neg Hx    Pancreatic cancer Neg Hx    Stomach cancer Neg Hx    Rectal cancer Neg Hx     Vitals:   03/27/21 0854  BP: 112/70  Pulse: 60  SpO2: 100%  Weight: 76.8 kg (169 lb 6.4 oz)  Height: 5\' 7"  (1.702 m)    PHYSICAL EXAM: General: NAD Neck: No JVD, no thyromegaly or thyroid nodule.  Lungs: Clear to auscultation bilaterally with normal respiratory effort. CV: Nondisplaced PMI.  Heart regular S1/S2, no S3/S4, no murmur.  No peripheral edema.  No carotid bruit.  Normal pedal pulses.  Abdomen: Soft, nontender, no hepatosplenomegaly, no distention.  Skin: Intact without lesions or rashes.  Neurologic: Alert and oriented x 3.  Psych: Normal affect. Extremities: No clubbing or cyanosis.  HEENT: Normal.   ASSESSMENT & PLAN: 1.  CAD: Post-op anterolateral STEMI 09/15/20.  No prior CAD but history of HTN and hyperlipidemia and strong FH of CAD.  He had occluded proximal LAD and D1, now s/p DES to LAD  and PTCA to D1.  No chest pain.  - Continue ASA 81 + Brilinta for 12 months, at that point will continue him on Plavix long-term.  - Continue atorvastatin 80 mg nightly. Good lipids 9/44.  2. Systolic CHF: Ischemic cardiomyopathy. Echo in 3/22 with EF 25-30%, LAD territory WMAs with no evidence for mechanical MI complications.  Repeat echo in 6/22 with EF up to 55-60% with mid-apical anterior hypokinesis. NYHA class I, not volume overloaded on exam.  - Continue Entresto 24/26 bid.  BMET today.  - Continue spironolactone 25 mg  - Continue  dapagliflozin 10 mg daily.   - Continue Coreg 3.125 mg bid.  3. PVCs: Resolved.  - Continue Coreg 3.125 mg bid.   Followup in 6 months, BMET 3 months.   Loralie Champagne, MD 03/28/21

## 2021-03-29 ENCOUNTER — Other Ambulatory Visit: Payer: Self-pay | Admitting: Internal Medicine

## 2021-04-06 ENCOUNTER — Other Ambulatory Visit (HOSPITAL_COMMUNITY): Payer: Self-pay | Admitting: Cardiology

## 2021-05-11 ENCOUNTER — Other Ambulatory Visit (HOSPITAL_COMMUNITY): Payer: Self-pay | Admitting: Cardiology

## 2021-05-12 ENCOUNTER — Ambulatory Visit (INDEPENDENT_AMBULATORY_CARE_PROVIDER_SITE_OTHER): Payer: Managed Care, Other (non HMO) | Admitting: Family Medicine

## 2021-05-12 VITALS — BP 120/70 | HR 59 | Temp 98.1°F | Ht 67.0 in | Wt 171.1 lb

## 2021-05-12 DIAGNOSIS — Z0000 Encounter for general adult medical examination without abnormal findings: Secondary | ICD-10-CM | POA: Diagnosis not present

## 2021-05-12 NOTE — Patient Instructions (Signed)
Consider Shingrix and Prevnar 20 vaccines at some point this year.  Be sure to get flu vaccine at soon as possible

## 2021-05-12 NOTE — Progress Notes (Signed)
Established Patient Office Visit  Subjective:  Patient ID: Jerry Kramer, male    DOB: [birth year]  Age: 65 y.o. MRN: [id number]  CC:  Chief Complaint  Patient presents with   Annual Exam    HPI Jerry Kramer presents for physical exam.  He has history of GERD and hiatal hernia and saw surgeon last year and underwent robotic assisted laparoscopic paraesophageal hernia repair with fundoplication on [id number].  He developed some PVCs postoperatively and then chest pain with anterolateral ST elevation.  Emergent cath showed occlusion of the proximal LAD treated with PTCA.  He had dyspnea after the procedure with lactic acidosis and acute kidney injury.  Been followed by cardiology closely since then.  Echo following event 25 to 30% and recent echo 6/22 EF 55 to 60%.  He had some mild apical anterior hypokinesis.  Patient doing reasonly well this time.  He is back to exercising.  Back to full-time work and plans to work about 3 more years before retiring.  He is on several medications for heart failure including [place] and Entresto.  No recent dyspnea or chest pains.  Does have past history of prediabetes range blood sugars.  Health maintenance reviewed  -Still needs flu vaccine.  He plans to get this Saturday at local pharmacy. -No history of Shingrix vaccine -Colonoscopy up-to-date -Tetanus up-to-date -No history of Pneumovax  Family history and social history reviewed with no significant changes  Past Medical History:  Diagnosis Date   Allergy    CHF (congestive heart failure) (HCC)    Colon polyps    Complication of anesthesia    hard to wake up   CONTACT DERMATITIS 09/03/2009   ELEVATED BLOOD PRESSURE 06/19/2010   HYPERLIPIDEMIA 08/27/2009   UNSPECIFIED OTALGIA 06/19/2010    Past Surgical History:  Procedure Laterality Date   APPENDECTOMY  1983   BIOPSY  08/06/2020   Procedure: BIOPSY;  Surgeon: Lavena Bullion, DO;  Location: [facility];  Service: Gastroenterology;;   esophageal manometry probe  placement   CORONARY/GRAFT ACUTE MI REVASCULARIZATION N/A 09/15/2020   Procedure: Coronary/Graft Acute MI Revascularization;  Surgeon: Leonie Man, MD;  Location: [facility];  Service: Cardiovascular;  Laterality: N/A;   ESOPHAGEAL MANOMETRY N/A 08/06/2020   Procedure: ESOPHAGEAL MANOMETRY (EM);  Surgeon: Lavena Bullion, DO;  Location: [facility];  Service: Gastroenterology;  Laterality: N/A;   ESOPHAGOGASTRODUODENOSCOPY N/A 09/15/2020   Procedure: ESOPHAGOGASTRODUODENOSCOPY (EGD);  Surgeon: Lajuana Matte, MD;  Location: [facility];  Service: Thoracic;  Laterality: N/A;   ESOPHAGOGASTRODUODENOSCOPY (EGD) WITH PROPOFOL N/A 08/06/2020   Procedure: ESOPHAGOGASTRODUODENOSCOPY (EGD) WITH PROPOFOL;  Surgeon: Lavena Bullion, DO;  Location: [facility];  Service: Gastroenterology;  Laterality: N/A;   EYE MUSCLE SURGERY Right    LEFT HEART CATH AND CORONARY ANGIOGRAPHY N/A 09/15/2020   Procedure: LEFT HEART CATH AND CORONARY ANGIOGRAPHY;  Surgeon: Leonie Man, MD;  Location: [facility];  Service: Cardiovascular;  Laterality: N/A;   MIDDLE EAR SURGERY     ROTATOR CUFF REPAIR Left    TONSILLECTOMY  1965   XI ROBOTIC ASSISTED HIATAL HERNIA REPAIR N/A 09/15/2020   Procedure: XI ROBOTIC ASSISTED HIATAL HERNIA REPAIR WITH MESH;  Surgeon: Lajuana Matte, MD;  Location: [facility];  Service: Thoracic;  Laterality: N/A;    Family History  Problem Relation Age of Onset   Heart disease Mother 72       CAD   Cancer Mother  lung   Hyperlipidemia Father    Stroke Father    Colon polyps Father    Diabetes Sister        type II   Heart attack Brother 29   Heart disease Brother 76       MI   Colon cancer Neg Hx    Esophageal cancer Neg Hx    Pancreatic cancer Neg Hx    Stomach cancer Neg Hx    Rectal cancer Neg Hx     Social History   Socioeconomic History   Marital status: Married    Spouse name: Not on file   Number of children:  Not on file   Years of education: Not on file   Highest education level: Not on file  Occupational History   Not on file  Tobacco Use   Smoking status: Never   Smokeless tobacco: Never  Vaping Use   Vaping Use: Never used  Substance and Sexual Activity   Alcohol use: No   Drug use: No   Sexual activity: Not on file  Other Topics Concern   Not on file  Social History Narrative   Occupation: Secondary school teacher   Married   Never Smoked   Alcohol use- no   Social Determinants of Radio broadcast assistant Strain: Not on file  Food Insecurity: Not on file  Transportation Needs: Not on file  Physical Activity: Not on file  Stress: Not on file  Social Connections: Not on file  Intimate Partner Violence: Not on file    Outpatient Medications Prior to Visit  Medication Sig Dispense Refill   aspirin 81 MG chewable tablet Chew 1 tablet (81 mg total) by mouth daily. 30 tablet 6   atorvastatin (LIPITOR) 80 MG tablet TAKE ONE TABLET BY MOUTH DAILY 90 tablet 3   carvedilol (COREG) 3.125 MG tablet TAKE 1 TABLET (3.125 MG TOTAL) BY MOUTH TWO TIMES DAILY WITH A MEAL. 60 tablet 6   dapagliflozin propanediol (FARXIGA) 10 MG TABS tablet Take 1 tablet (10 mg total) by mouth daily. 30 tablet 6   pantoprazole (PROTONIX) 40 MG tablet TAKE ONE TABLET BY MOUTH DAILY 30 tablet 2   sacubitril-valsartan (ENTRESTO) 24-26 MG Take 1 tablet by mouth 2 (two) times daily. 60 tablet 11   sildenafil (VIAGRA) 100 MG tablet Take 1 tablet (100 mg total) by mouth daily as needed for erectile dysfunction. 10 tablet 5   spironolactone (ALDACTONE) 25 MG tablet Take 1 tablet (25 mg total) by mouth at bedtime. 30 tablet 6   ticagrelor (BRILINTA) 90 MG TABS tablet Take 1 tablet (90 mg total) by mouth 2 (two) times daily. 60 tablet 6   No facility-administered medications prior to visit.    Allergies  Allergen Reactions   Meperidine Hcl Nausea And Vomiting   Penicillins Other (See Comments)    REACTION:  Childhood   Advil [Ibuprofen] Palpitations   Pseudoephedrine Hcl Palpitations    ROS Review of Systems  Constitutional:  Negative for activity change, appetite change, fatigue and fever.  HENT:  Negative for congestion, ear pain and trouble swallowing.   Eyes:  Negative for pain and visual disturbance.  Respiratory:  Negative for cough, shortness of breath and wheezing.   Cardiovascular:  Negative for chest pain and palpitations.  Gastrointestinal:  Negative for abdominal distention, abdominal pain, blood in stool, constipation, diarrhea, nausea, rectal pain and vomiting.  Endocrine: Negative for polydipsia and polyuria.  Genitourinary:  Negative for dysuria, hematuria and testicular pain.  Musculoskeletal:  Negative for arthralgias and joint swelling.  Skin:  Negative for rash.  Neurological:  Negative for dizziness, syncope and headaches.  Hematological:  Negative for adenopathy.  Psychiatric/Behavioral:  Negative for confusion and dysphoric mood.      Objective:    Physical Exam Constitutional:      General: He is not in acute distress.    Appearance: He is well-developed.  HENT:     Head: Normocephalic and atraumatic.     Right Ear: External ear normal.     Left Ear: External ear normal.  Eyes:     Conjunctiva/sclera: Conjunctivae normal.     Pupils: Pupils are equal, round, and reactive to light.  Neck:     Thyroid: No thyromegaly.  Cardiovascular:     Rate and Rhythm: Normal rate and regular rhythm.     Heart sounds: Normal heart sounds.  Pulmonary:     Effort: No respiratory distress.     Breath sounds: No wheezing or rales.  Abdominal:     General: Bowel sounds are normal. There is no distension.     Palpations: Abdomen is soft. There is no mass.     Tenderness: There is no abdominal tenderness. There is no guarding or rebound.  Musculoskeletal:     Cervical back: Normal range of motion and neck supple.     Right lower leg: No edema.     Left lower leg: No  edema.  Lymphadenopathy:     Cervical: No cervical adenopathy.  Skin:    Findings: No rash.  Neurological:     Mental Status: He is alert and oriented to person, place, and time.     Cranial Nerves: No cranial nerve deficit.    BP 120/70 (BP Location: Left Arm, Patient Position: Sitting, Cuff Size: Normal)   Pulse (!) 59   Temp 98.1 F (36.7 C) (Oral)   Ht 5\' 7"  (1.702 m)   Wt 171 lb 1.6 oz (77.6 kg)   SpO2 94%   BMI 26.80 kg/m  Wt Readings from Last 3 Encounters:  05/12/21 171 lb 1.6 oz (77.6 kg)  03/27/21 169 lb 6.4 oz (76.8 kg)  02/25/21 167 lb (75.8 kg)     Health Maintenance Due  Topic Date Due   COVID-19 Vaccine (1) Never done   Pneumococcal Vaccine 6-24 Years old (1 - PCV) Never done   Zoster Vaccines- Shingrix (1 of 2) Never done   INFLUENZA VACCINE  01/26/2021    There are no preventive care reminders to display for this patient.  Lab Results  Component Value Date   TSH 1.45 11/09/2017   Lab Results  Component Value Date   WBC 5.7 10/09/2020   HGB 12.8 (L) 10/09/2020   HCT 39.9 10/09/2020   MCV 86.9 10/09/2020   PLT 270 10/09/2020   Lab Results  Component Value Date   NA 138 03/27/2021   K 3.9 03/27/2021   CO2 22 03/27/2021   GLUCOSE 74 03/27/2021   BUN 14 03/27/2021   CREATININE 0.93 03/27/2021   BILITOT 1.0 09/15/2020   ALKPHOS 61 09/15/2020   AST 197 (H) 09/15/2020   ALT 172 (H) 09/15/2020   PROT 6.1 (L) 09/15/2020   ALBUMIN 3.7 09/15/2020   CALCIUM 9.3 03/27/2021   ANIONGAP 9 03/27/2021   GFR 77.24 11/09/2017   Lab Results  Component Value Date   CHOL 92 11/26/2020   Lab Results  Component Value Date   HDL 39 (L) 11/26/2020   Lab Results  Component Value  Date   LDLCALC 40 11/26/2020   Lab Results  Component Value Date   TRIG 64 11/26/2020   Lab Results  Component Value Date   CHOLHDL 2.4 11/26/2020   Lab Results  Component Value Date   HGBA1C 6.0 (H) 09/15/2020      Assessment & Plan:   Problem List Items  Addressed This Visit   None Visit Diagnoses     Physical exam    -  Primary     Patient has history of MI as above with PTCA left anterior descending.  Has done well since then.  Did have significantly reduced ejection fraction and no most recent echo this is improved.  Symptomatically improved at this time.  Still has some occasional lightheadedness with medications.  We discussed the following health maintenance issues  -He is getting lipids monitored through cardiology and had these in June.  These were reviewed -We did discuss other potential screening labs such as PSA and he declines at this time.  Will consider by next year. -Strongly advocate flu vaccine as soon as possible.  He declines but plans to get this Saturday -We have also encouraged him to consider Pneumovax.  We will turn 80 in August but would not wait with his cardiac history -Discussed Shingrix vaccine and he will consider at some point this year  No orders of the defined types were placed in this encounter.   Follow-up: No follow-ups on file.    Carolann Littler, MD

## 2021-05-31 ENCOUNTER — Other Ambulatory Visit (HOSPITAL_COMMUNITY): Payer: Self-pay | Admitting: Cardiology

## 2021-06-24 ENCOUNTER — Other Ambulatory Visit: Payer: Self-pay | Admitting: Internal Medicine

## 2021-06-26 ENCOUNTER — Other Ambulatory Visit: Payer: Self-pay

## 2021-06-26 ENCOUNTER — Ambulatory Visit (HOSPITAL_COMMUNITY)
Admission: RE | Admit: 2021-06-26 | Discharge: 2021-06-26 | Disposition: A | Discharge status: Home / Self Care | Payer: Managed Care, Other (non HMO) | Source: Ambulatory Visit | Attending: Cardiology | Admitting: Cardiology

## 2021-06-26 DIAGNOSIS — I509 Heart failure, unspecified: Secondary | ICD-10-CM | POA: Diagnosis not present

## 2021-06-26 LAB — BASIC METABOLIC PANEL
Anion gap: 6 (ref 5–15)
BUN: 15 mg/dL (ref 8–23)
CO2: 24 mmol/L (ref 22–32)
Calcium: 9.5 mg/dL (ref 8.9–10.3)
Chloride: 109 mmol/L (ref 98–111)
Creatinine, Ser: 0.95 mg/dL (ref 0.61–1.24)
GFR, Estimated: >60 mL/min (ref >60)
Glucose, Bld: 94 mg/dL (ref 70–99)
Potassium: 3.9 mmol/L (ref 3.5–5.1)
Sodium: 139 mmol/L (ref 135–145)

## 2021-06-29 ENCOUNTER — Encounter (HOSPITAL_COMMUNITY): Payer: Self-pay | Admitting: Cardiology

## 2021-07-28 ENCOUNTER — Telehealth (HOSPITAL_COMMUNITY): Payer: Self-pay | Admitting: *Deleted

## 2021-07-28 NOTE — Telephone Encounter (Signed)
Received fax from Dr Mingo Amber office, pt needs clearance for extraction of 2 teeth and recommendations for Brilinta  Per Dr Aundra Dubin: "need to continue Brilinta until March 2023, ok for IV sedation"  Note faxed back to them at [phone]

## 2021-08-16 ENCOUNTER — Telehealth: Payer: Managed Care, Other (non HMO) | Admitting: Nurse Practitioner

## 2021-08-16 DIAGNOSIS — H1031 Unspecified acute conjunctivitis, right eye: Secondary | ICD-10-CM

## 2021-08-16 MED ORDER — POLYMYXIN B-TRIMETHOPRIM 10000-0.1 UNIT/ML-% OP SOLN: 2.0000 [drp] | Freq: Four times a day (QID) | OPHTHALMIC | 0 refills | Status: DC

## 2021-08-16 MED ORDER — POLYMYXIN B-TRIMETHOPRIM 10000-0.1 UNIT/ML-% OP SOLN
2.0000 [drp] | Freq: Four times a day (QID) | OPHTHALMIC | 0 refills | Status: DC
Start: 2021-08-16 — End: 2021-08-16
  Filled 2021-08-16: qty 10, 25d supply, fill #0

## 2021-08-16 NOTE — Addendum Note (Signed)
Addended by: Evelina Dun A on: 08/16/2021 02:54 PM   Modules accepted: Orders

## 2021-08-16 NOTE — Progress Notes (Signed)
E-Visit for [employer]   We are sorry that you are not feeling well.  Here is how we plan to help!  Based on what you have shared with me it looks like you have conjunctivitis.  Conjunctivitis is a common inflammatory or infectious condition of the eye that is often referred to as "pink eye".  In most cases it is contagious (viral or bacterial). However, not all conjunctivitis requires antibiotics (ex. Allergic).  We have made appropriate suggestions for you based upon your presentation.  I have prescribed Polytrim Ophthalmic drops 1-2 drops 4 times a day times 5 days  Pink eye can be highly contagious.  It is typically spread through direct contact with secretions, or contaminated objects or surfaces that one may have touched.  Strict handwashing is suggested with soap and water is urged.  If not available, use alcohol based had sanitizer.  Avoid unnecessary touching of the eye.  If you wear contact lenses, you will need to refrain from wearing them until you see no white discharge from the eye for at least 24 hours after being on medication.  You should see symptom improvement in 1-2 days after starting the medication regimen.  Call us if symptoms are not improved in 1-2 days.  Home Care: Wash your hands often! Do not wear your contacts until you complete your treatment plan. Avoid sharing towels, bed linen, personal items with a person who has pink eye. See attention for anyone in your home with similar symptoms.  Get Help Right Away If: Your symptoms do not improve. You develop blurred or loss of vision. Your symptoms worsen (increased discharge, pain or redness)   Thank you for choosing an e-visit.  Your e-visit answers were reviewed by a board certified advanced clinical practitioner to complete your personal care plan. Depending upon the condition, your plan could have included both over the counter or prescription medications.  Please review your pharmacy choice. Make sure the  pharmacy is open so you can pick up prescription now. If there is a problem, you may contact your provider through [employer] and have the prescription routed to another pharmacy.  Your safety is important to [place]. If you have drug allergies check your prescription carefully.   For the next 24 hours you can use MyChart to ask questions about today's visit, request a non-urgent call back, or ask for a work or school excuse. You will get an email in the next two days asking about your experience. I hope that your e-visit has been valuable and will speed your recovery.   I have spent at least 5 minutes reviewing and documenting in the patient's chart.

## 2021-08-17 ENCOUNTER — Other Ambulatory Visit (HOSPITAL_BASED_OUTPATIENT_CLINIC_OR_DEPARTMENT_OTHER): Payer: Self-pay

## 2021-08-18 ENCOUNTER — Other Ambulatory Visit: Payer: Self-pay

## 2021-08-18 ENCOUNTER — Emergency Department (INDEPENDENT_AMBULATORY_CARE_PROVIDER_SITE_OTHER)
Admission: EM | Admit: 2021-08-18 | Discharge: 2021-08-18 | Disposition: A | Discharge status: Home / Self Care | Payer: Managed Care, Other (non HMO) | Source: Home / Self Care

## 2021-08-18 ENCOUNTER — Encounter: Payer: Self-pay | Admitting: Emergency Medicine

## 2021-08-18 DIAGNOSIS — H1031 Unspecified acute conjunctivitis, right eye: Secondary | ICD-10-CM | POA: Diagnosis not present

## 2021-08-18 MED ORDER — OFLOXACIN 0.3 % OP SOLN: 1.0000 [drp] | Freq: Four times a day (QID) | OPHTHALMIC | 0 refills | Status: DC

## 2021-08-18 NOTE — Discharge Instructions (Signed)
Use eyedrops to cover for infection.  You can use lubricating eyedrops (artificial tears) for symptom relief.  Do not wear contacts until symptoms resolve.  Make sure to wear eye protection anytime he could be exposed to fine particular matter or chemicals.  If your symptoms or not improving within a day or so please follow-up with ophthalmologist (call to schedule an appointment).  If you have any severe symptoms including visual change, severe pain, headache, nausea, vomiting you need to go to the emergency room.

## 2021-08-18 NOTE — ED Triage Notes (Signed)
Pt has had redness since Saturday  E-visit on Sunday - eye gtts started Pt here today - feels like there may be something in the eye Denies vision changes  R eye red w/ drainage

## 2021-08-18 NOTE — ED Provider Notes (Signed)
[name]    CSN: [id number] Arrival date & time: 08/18/21  2119      History   Chief Complaint Chief Complaint  Patient presents with   Eye Pain    right    HPI Jerry Kramer is a 66 y.o. male.   Patient presents today with several days of right eye irritation with associated drainage.  He completed an E-visit and was prescribed Polytrim drops which he has been using for 2 days without improvement.  He does report continued irritation with significant drainage particularly overnight.  He reports recovering from URI approximately 1 to 2 weeks ago but has no additional ongoing symptoms.  He wears reading glasses but does not wear contacts.  Denies any ocular injury.  He denies any exposure to chemical fumes or fine particulate matter.  He does report an irritated feeling as though there might be something in the upper part of his eye prompting evaluation.  He has not seen an ophthalmologist recently.  Denies any severe eye pain, fever, headache, dizziness, vision change.   Past Medical History:  Diagnosis Date   Allergy    CHF (congestive heart failure) ([place])    Colon polyps    Complication of anesthesia    hard to wake up   CONTACT DERMATITIS 09/03/2009   ELEVATED BLOOD PRESSURE 06/19/2010   HYPERLIPIDEMIA 08/27/2009   UNSPECIFIED OTALGIA 06/19/2010    Patient Active Problem List   Diagnosis Date Noted   Hiatal hernia with gastroesophageal reflux 09/15/2020   Acute ST elevation myocardial infarction (STEMI) due to occlusion of proximal portion of left anterior descending (LAD) coronary artery ([place]) 09/15/2020   Coronary artery disease involving native coronary artery of native heart with unstable angina pectoris ([place])    Encounter for central line placement    Acute systolic CHF (congestive heart failure) ([place])    Dysphagia    Hiatal hernia    Gastritis and gastroduodenitis    Esophageal stricture    Presbyesophagus    Obesity (BMI 30-39.9) 04/26/2014    UNSPECIFIED OTALGIA 06/19/2010   ELEVATED BLOOD PRESSURE 06/19/2010   CONTACT DERMATITIS 09/03/2009   HYPERLIPIDEMIA 08/27/2009    Past Surgical History:  Procedure Laterality Date   APPENDECTOMY  1983   BIOPSY  08/06/2020   Procedure: BIOPSY;  Surgeon: Lavena Bullion, DO;  [facility]: [facility];  Service: Gastroenterology;;  esophageal manometry probe  placement   CORONARY/GRAFT ACUTE MI REVASCULARIZATION N/A 09/15/2020   Procedure: Coronary/Graft Acute MI Revascularization;  Surgeon: Leonie Man, MD;  Location: [facility];  Service: Cardiovascular;  Laterality: N/A;   ESOPHAGEAL MANOMETRY N/A 08/06/2020   Procedure: ESOPHAGEAL MANOMETRY (EM);  Surgeon: Lavena Bullion, DO;  [facility];  Service: Gastroenterology;  Laterality: N/A;   ESOPHAGOGASTRODUODENOSCOPY N/A 09/15/2020   Procedure: ESOPHAGOGASTRODUODENOSCOPY (EGD);  Surgeon: Lajuana Matte, MD;  Location: [facility];  Service: Thoracic;  Laterality: N/A;   ESOPHAGOGASTRODUODENOSCOPY (EGD) WITH PROPOFOL N/A 08/06/2020   Procedure: ESOPHAGOGASTRODUODENOSCOPY (EGD) WITH PROPOFOL;  Surgeon: Lavena Bullion, DO;  [facility];  Service: Gastroenterology;  Laterality: N/A;   EYE MUSCLE SURGERY Right    LEFT HEART CATH AND CORONARY ANGIOGRAPHY N/A 09/15/2020   Procedure: LEFT HEART CATH AND CORONARY ANGIOGRAPHY;  Surgeon: Leonie Man, MD;  Location: [facility];  Service: Cardiovascular;  Laterality: N/A;   MIDDLE EAR SURGERY     ROTATOR CUFF REPAIR Left    TONSILLECTOMY  1965   XI ROBOTIC ASSISTED HIATAL  HERNIA REPAIR N/A 09/15/2020   Procedure: XI ROBOTIC ASSISTED HIATAL HERNIA REPAIR WITH MESH;  Surgeon: Lajuana Matte, MD;  [facility]: [facility];  Service: Thoracic;  Laterality: N/A;       Home Medications    Prior to Admission medications   Medication Sig Start Date End Date Taking? Authorizing Provider  carvedilol (COREG) 3.125 MG tablet TAKE ONE TABLET BY MOUTH TWICE A DAY  WITH A MEAL 05/12/21   Simmons, Brittainy M, PA-C  FARXIGA 10 MG TABS tablet TAKE ONE TABLET BY MOUTH DAILY 05/12/21   Lyda Jester M, PA-C  ofloxacin (OCUFLOX) 0.3 % ophthalmic solution Place 1 drop into the right eye 4 (four) times daily. 08/18/21  Yes Teniyah Seivert, Derry Skill, PA-C  aspirin 81 MG chewable tablet Chew 1 tablet (81 mg total) by mouth daily. 10/09/20   Lyda Jester M, PA-C  atorvastatin (LIPITOR) 80 MG tablet TAKE ONE TABLET BY MOUTH DAILY 04/08/21   Larey Dresser, MD  BRILINTA 90 MG TABS tablet TAKE ONE TABLET BY MOUTH TWICE A DAY 06/01/21   Lyda Jester M, PA-C  pantoprazole (PROTONIX) 40 MG tablet TAKE ONE TABLET BY MOUTH DAILY 06/24/21   Pyrtle, Lajuan Lines, MD  sacubitril-valsartan (ENTRESTO) 24-26 MG Take 1 tablet by mouth 2 (two) times daily. 11/26/20   Larey Dresser, MD  sildenafil (VIAGRA) 100 MG tablet Take 1 tablet (100 mg total) by mouth daily as needed for erectile dysfunction. 11/07/19   Burchette, Alinda Sierras, MD  spironolactone (ALDACTONE) 25 MG tablet Take 1 tablet (25 mg total) by mouth at bedtime. 03/27/21   Larey Dresser, MD    Family History Family History  Problem Relation Age of Onset   Heart disease Mother 96       CAD   Cancer Mother        lung   Hyperlipidemia Father    Stroke Father    Colon polyps Father    Diabetes Sister        type II   Heart attack Brother 46   Heart disease Brother 47       MI   Colon cancer Neg Hx    Esophageal cancer Neg Hx    Pancreatic cancer Neg Hx    Stomach cancer Neg Hx    Rectal cancer Neg Hx     Social History Social History   Tobacco Use   Smoking status: Never   Smokeless tobacco: Never  Vaping Use   Vaping Use: Never used  Substance Use Topics   Alcohol use: No   Drug use: No     Allergies   Meperidine hcl, Penicillins, Advil [ibuprofen], and Pseudoephedrine hcl   Review of Systems Review of Systems  Constitutional:  Positive for activity change. Negative for appetite change, fatigue  and fever.  Eyes:  Positive for discharge, redness and itching. Negative for photophobia, pain and visual disturbance.  Respiratory:  Negative for cough and shortness of breath.   Cardiovascular:  Negative for chest pain.  Gastrointestinal:  Negative for abdominal pain, diarrhea, nausea and vomiting.  Neurological:  Negative for dizziness, light-headedness and headaches.    Physical Exam Triage Vital Signs ED Triage Vitals  Enc Vitals Group     BP 08/18/21 0826 104/71     Pulse Rate 08/18/21 0826 78     Resp 08/18/21 0826 15     Temp 08/18/21 0826 98.9 F (37.2 C)     Temp Source 08/18/21 0826 Oral     SpO2  08/18/21 0826 96 %     Weight 08/18/21 0828 171 lb 1.2 oz (77.6 kg)     Height --      Head Circumference --      Peak Flow --      Pain Score 08/18/21 0824 2     Pain Loc --      Pain Edu? --      Excl. in [place]? --    No data found.  Updated Vital Signs BP 104/71 (BP Location: Right Arm)    Pulse 78    Temp 98.9 F (37.2 C) (Oral)    Resp 15    Wt 171 lb 1.2 oz (77.6 kg)    SpO2 96%    BMI 26.79 kg/m   Visual Acuity Right Eye Distance: 20/70 Left Eye Distance: 20/50 Bilateral Distance: 20/40  Right Eye Near:   Left Eye Near:    Bilateral Near:     Physical Exam Vitals reviewed.  Constitutional:      General: He is awake.     Appearance: Normal appearance. He is well-developed. He is not ill-appearing.     Comments: Very pleasant male appears stated age in no acute distress sitting comfortably in exam room  HENT:     Head: Normocephalic and atraumatic.  Eyes:     General: Lids are everted, no foreign bodies appreciated.        Right eye: No foreign body.        Left eye: No foreign body.     Extraocular Movements: Extraocular movements intact.     Conjunctiva/sclera:     Right eye: Right conjunctiva is injected.     Left eye: Left conjunctiva is not injected.     Pupils: Pupils are equal, round, and reactive to light.     Right eye: No corneal abrasion or  fluorescein uptake.     Funduscopic exam:    Right eye: No hemorrhage.        Left eye: No hemorrhage.  Cardiovascular:     Rate and Rhythm: Normal rate and regular rhythm.     Heart sounds: Normal heart sounds, S1 normal and S2 normal. No murmur heard. Pulmonary:     Effort: Pulmonary effort is normal.     Breath sounds: Normal breath sounds. No stridor. No wheezing, rhonchi or rales.     Comments: Clear to auscultation bilaterally Neurological:     Mental Status: He is alert.  Psychiatric:        Behavior: Behavior is cooperative.     UC Treatments / Results  Labs (all labs ordered are listed, but only abnormal results are displayed) Labs Reviewed - No data to display  EKG   Radiology No results found.  Procedures Procedures (including critical care time)  Medications Ordered in UC Medications - No data to display  Initial Impression / Assessment and Plan / UC Course  I have reviewed the triage vital signs and the nursing notes.  Pertinent labs & imaging results that were available during my care of the patient were reviewed by me and considered in my medical decision making (see chart for details).     No foreign body or corneal abrasion noted on fluorescein exam or with inversion of eyelid.  Patient did report improvement of symptoms following the procedure.  We will transition from Polytrim to ofloxacin given ongoing symptoms.  Discussed that symptoms could be viral in nature and may require several days to resolve.  Discussed that if symptoms  or not improving he should follow-up with ophthalmologist and was given contact information for local provider.  Can use lubricating eyedrops for additional symptom relief.  Discussed that if he has any worsening symptoms including ocular pain, visual changes, headache, dizziness, nausea, vomiting he is to be seen immediately.  Strict return precautions given to which he expressed understanding.  Final Clinical Impressions(s) /  UC Diagnoses   Final diagnoses:  Acute bacterial conjunctivitis of right eye     Discharge Instructions      Use eyedrops to cover for infection.  You can use lubricating eyedrops (artificial tears) for symptom relief.  Do not wear contacts until symptoms resolve.  Make sure to wear eye protection anytime he could be exposed to fine particular matter or chemicals.  If your symptoms or not improving within a day or so please follow-up with ophthalmologist (call to schedule an appointment).  If you have any severe symptoms including visual change, severe pain, headache, nausea, vomiting you need to go to the emergency room.     ED Prescriptions     Medication Sig Dispense Auth. Provider   ofloxacin (OCUFLOX) 0.3 % ophthalmic solution Place 1 drop into the right eye 4 (four) times daily. 5 mL Henderson Frampton K, PA-C      PDMP not reviewed this encounter.   Terrilee Croak, PA-C 08/18/21 0901

## 2021-09-01 ENCOUNTER — Emergency Department (INDEPENDENT_AMBULATORY_CARE_PROVIDER_SITE_OTHER)
Admission: EM | Admit: 2021-09-01 | Discharge: 2021-09-01 | Disposition: A | Discharge status: Home / Self Care | Payer: Managed Care, Other (non HMO) | Source: Home / Self Care

## 2021-09-01 ENCOUNTER — Other Ambulatory Visit: Payer: Self-pay

## 2021-09-01 DIAGNOSIS — H6592 Unspecified nonsuppurative otitis media, left ear: Secondary | ICD-10-CM

## 2021-09-01 MED ORDER — CEFDINIR 300 MG PO CAPS: 300.0000 mg | ORAL_CAPSULE | Freq: Two times a day (BID) | ORAL | 0 refills | Status: AC

## 2021-09-01 NOTE — Discharge Instructions (Addendum)
Advised patient to take medication as directed with food to completion.  Encouraged patient to increase daily water intake while taking this medication.  Advised/encouraged patient if symptoms worsen and/or unresolved please follow-up with PCP or here for further evaluation. ?

## 2021-09-01 NOTE — ED Provider Notes (Signed)
[name]    CSN: [id number] Arrival date & time: 09/01/21  1430      History   Chief Complaint Chief Complaint  Patient presents with   Ear Fullness    LT    HPI Jerry Kramer is a 66 y.o. male.   HPI 66 year old male presents with left ear fullness for 1 week.  Reports using OTC drops and irrigating left ear himself with little to no relief PMH significant for CAD s/p acute STEMI and acute systolic heart failure.  Past Medical History:  Diagnosis Date   Allergy    CHF (congestive heart failure) ([place])    Colon polyps    Complication of anesthesia    hard to wake up   CONTACT DERMATITIS 09/03/2009   ELEVATED BLOOD PRESSURE 06/19/2010   HYPERLIPIDEMIA 08/27/2009   UNSPECIFIED OTALGIA 06/19/2010    Patient Active Problem List   Diagnosis Date Noted   Hiatal hernia with gastroesophageal reflux 09/15/2020   Acute ST elevation myocardial infarction (STEMI) due to occlusion of proximal portion of left anterior descending (LAD) coronary artery ([place]) 09/15/2020   Coronary artery disease involving native coronary artery of native heart with unstable angina pectoris ([place])    Encounter for central line placement    Acute systolic CHF (congestive heart failure) ([place])    Dysphagia    Hiatal hernia    Gastritis and gastroduodenitis    Esophageal stricture    Presbyesophagus    Obesity (BMI 30-39.9) 04/26/2014   UNSPECIFIED OTALGIA 06/19/2010   ELEVATED BLOOD PRESSURE 06/19/2010   CONTACT DERMATITIS 09/03/2009   HYPERLIPIDEMIA 08/27/2009    Past Surgical History:  Procedure Laterality Date   APPENDECTOMY  1983   BIOPSY  08/06/2020   Procedure: BIOPSY;  Surgeon: Lavena Bullion, DO;  [facility];  Service: Gastroenterology;;  esophageal manometry probe  placement   CORONARY/GRAFT ACUTE MI REVASCULARIZATION N/A 09/15/2020   Procedure: Coronary/Graft Acute MI Revascularization;  Surgeon: Leonie Man, MD;  Location: [facility];  Service:  Cardiovascular;  Laterality: N/A;   ESOPHAGEAL MANOMETRY N/A 08/06/2020   Procedure: ESOPHAGEAL MANOMETRY (EM);  Surgeon: Lavena Bullion, DO;  [facility];  Service: Gastroenterology;  Laterality: N/A;   ESOPHAGOGASTRODUODENOSCOPY N/A 09/15/2020   Procedure: ESOPHAGOGASTRODUODENOSCOPY (EGD);  Surgeon: Lajuana Matte, MD;  Location: [facility];  Service: Thoracic;  Laterality: N/A;   ESOPHAGOGASTRODUODENOSCOPY (EGD) WITH PROPOFOL N/A 08/06/2020   Procedure: ESOPHAGOGASTRODUODENOSCOPY (EGD) WITH PROPOFOL;  Surgeon: Lavena Bullion, DO;  [facility];  Service: Gastroenterology;  Laterality: N/A;   EYE MUSCLE SURGERY Right    LEFT HEART CATH AND CORONARY ANGIOGRAPHY N/A 09/15/2020   Procedure: LEFT HEART CATH AND CORONARY ANGIOGRAPHY;  Surgeon: Leonie Man, MD;  Location: [facility];  Service: Cardiovascular;  Laterality: N/A;   MIDDLE EAR SURGERY     ROTATOR CUFF REPAIR Left    TONSILLECTOMY  1965   XI ROBOTIC ASSISTED HIATAL HERNIA REPAIR N/A 09/15/2020   Procedure: XI ROBOTIC ASSISTED HIATAL HERNIA REPAIR WITH MESH;  Surgeon: Lajuana Matte, MD;  Location: [facility];  Service: Thoracic;  Laterality: N/A;       Home Medications    Prior to Admission medications   Medication Sig Start Date End Date Taking? Authorizing Provider  carvedilol (COREG) 3.125 MG tablet TAKE ONE TABLET BY MOUTH TWICE A DAY WITH A MEAL 05/12/21   Lyda Jester M, PA-C  cefdinir (OMNICEF) 300 MG capsule Take 1 capsule (300 mg total)  by mouth 2 (two) times daily for 10 days. 09/01/21 09/11/21 Yes Eliezer Lofts, FNP  FARXIGA 10 MG TABS tablet TAKE ONE TABLET BY MOUTH DAILY 05/12/21   Lyda Jester M, PA-C  aspirin 81 MG chewable tablet Chew 1 tablet (81 mg total) by mouth daily. 10/09/20   Lyda Jester M, PA-C  atorvastatin (LIPITOR) 80 MG tablet TAKE ONE TABLET BY MOUTH DAILY 04/08/21   Larey Dresser, MD  BRILINTA 90 MG TABS tablet TAKE ONE TABLET BY MOUTH TWICE A  DAY 06/01/21   Lyda Jester M, PA-C  ofloxacin (OCUFLOX) 0.3 % ophthalmic solution Place 1 drop into the right eye 4 (four) times daily. 08/18/21   Raspet, Junie Panning K, PA-C  pantoprazole (PROTONIX) 40 MG tablet TAKE ONE TABLET BY MOUTH DAILY 06/24/21   Pyrtle, Lajuan Lines, MD  sacubitril-valsartan (ENTRESTO) 24-26 MG Take 1 tablet by mouth 2 (two) times daily. 11/26/20   Larey Dresser, MD  sildenafil (VIAGRA) 100 MG tablet Take 1 tablet (100 mg total) by mouth daily as needed for erectile dysfunction. 11/07/19   Burchette, Alinda Sierras, MD  spironolactone (ALDACTONE) 25 MG tablet Take 1 tablet (25 mg total) by mouth at bedtime. 03/27/21   Larey Dresser, MD    Family History Family History  Problem Relation Age of Onset   Heart disease Mother 9       CAD   Cancer Mother        lung   Hyperlipidemia Father    Stroke Father    Colon polyps Father    Diabetes Sister        type II   Heart attack Brother 32   Heart disease Brother 30       MI   Colon cancer Neg Hx    Esophageal cancer Neg Hx    Pancreatic cancer Neg Hx    Stomach cancer Neg Hx    Rectal cancer Neg Hx     Social History Social History   Tobacco Use   Smoking status: Never   Smokeless tobacco: Never  Vaping Use   Vaping Use: Never used  Substance Use Topics   Alcohol use: No   Drug use: No     Allergies   Meperidine hcl, Penicillins, Advil [ibuprofen], and Pseudoephedrine hcl   Review of Systems Review of Systems  HENT:  Positive for ear pain.        Left ear fullness x1 week    Physical Exam Triage Vital Signs ED Triage Vitals  Enc Vitals Group     BP 09/01/21 1445 115/78     Pulse Rate 09/01/21 1445 61     Resp 09/01/21 1445 16     Temp 09/01/21 1445 97.9 F (36.6 C)     Temp Source 09/01/21 1445 Oral     SpO2 09/01/21 1445 100 %     Weight --      Height --      Head Circumference --      Peak Flow --      Pain Score 09/01/21 1446 0     Pain Loc --      Pain Edu? --      Excl. in [place]? --     No data found.  Updated Vital Signs BP 115/78 (BP Location: Right Arm)    Pulse 61    Temp 97.9 F (36.6 C) (Oral)    Resp 16    SpO2 100%    Physical Exam Vitals and nursing  note reviewed.  Constitutional:      Appearance: Normal appearance. He is normal weight.  HENT:     Head: Normocephalic and atraumatic.     Right Ear: Tympanic membrane, ear canal and external ear normal.     Left Ear: Ear canal and external ear normal.     Ears:     Comments: Left TM: Red rimmed, retracted with scant serous effusions noted    Mouth/Throat:     Mouth: Mucous membranes are moist.     Pharynx: Oropharynx is clear.  Eyes:     Extraocular Movements: Extraocular movements intact.     Conjunctiva/sclera: Conjunctivae normal.     Pupils: Pupils are equal, round, and reactive to light.  Cardiovascular:     Rate and Rhythm: Normal rate and regular rhythm.     Pulses: Normal pulses.     Heart sounds: Normal heart sounds.  Pulmonary:     Effort: Pulmonary effort is normal.     Breath sounds: Normal breath sounds. No wheezing, rhonchi or rales.  Musculoskeletal:     Cervical back: Normal range of motion and neck supple.  Skin:    General: Skin is warm and dry.  Neurological:     General: No focal deficit present.     Mental Status: He is alert and oriented to person, place, and time.     UC Treatments / Results  Labs (all labs ordered are listed, but only abnormal results are displayed) Labs Reviewed - No data to display  EKG   Radiology No results found.  Procedures Procedures (including critical care time)  Medications Ordered in UC Medications - No data to display  Initial Impression / Assessment and Plan / UC Course  I have reviewed the triage vital signs and the nursing notes.  Pertinent labs & imaging results that were available during my care of the patient were reviewed by me and considered in my medical decision making (see chart for details).     MDM: Left  otitis media-Rx'd Cefdinir. Advised patient to take medication as directed with food to completion.  Encouraged patient to increase daily water intake while taking this medication.  Advised/encouraged patient if symptoms worsen and/or unresolved please follow-up with PCP or here for further evaluation.  Patient discharged home, hemodynamically stable. Final Clinical Impressions(s) / UC Diagnoses   Final diagnoses:  Left otitis media with effusion     Discharge Instructions      Advised patient to take medication as directed with food to completion.  Encouraged patient to increase daily water intake while taking this medication.  Advised/encouraged patient if symptoms worsen and/or unresolved please follow-up with PCP or here for further evaluation.     ED Prescriptions     Medication Sig Dispense Auth. Provider   cefdinir (OMNICEF) 300 MG capsule Take 1 capsule (300 mg total) by mouth 2 (two) times daily for 10 days. 20 capsule Eliezer Lofts, FNP      PDMP not reviewed this encounter.   Eliezer Lofts, [place] 09/01/21 1718

## 2021-09-01 NOTE — ED Triage Notes (Signed)
Pt c/o LT ear fullness x 1 week. Intermittent pain. OTC drop and irrigation prn.  ?

## 2021-09-21 ENCOUNTER — Encounter (HOSPITAL_COMMUNITY): Payer: Self-pay | Admitting: Cardiology

## 2021-09-21 ENCOUNTER — Other Ambulatory Visit: Payer: Self-pay

## 2021-09-21 ENCOUNTER — Ambulatory Visit (HOSPITAL_COMMUNITY)
Admission: RE | Admit: 2021-09-21 | Discharge: 2021-09-21 | Disposition: A | Discharge status: Home / Self Care | Payer: Managed Care, Other (non HMO) | Source: Ambulatory Visit | Attending: Cardiology | Admitting: Cardiology

## 2021-09-21 VITALS — BP 112/70 | HR 82 | Wt 166.2 lb

## 2021-09-21 DIAGNOSIS — I110 Hypertensive heart disease with heart failure: Secondary | ICD-10-CM | POA: Insufficient documentation

## 2021-09-21 DIAGNOSIS — Z7982 Long term (current) use of aspirin: Secondary | ICD-10-CM | POA: Diagnosis not present

## 2021-09-21 DIAGNOSIS — I493 Ventricular premature depolarization: Secondary | ICD-10-CM | POA: Insufficient documentation

## 2021-09-21 DIAGNOSIS — Z9861 Coronary angioplasty status: Secondary | ICD-10-CM

## 2021-09-21 DIAGNOSIS — I255 Ischemic cardiomyopathy: Secondary | ICD-10-CM | POA: Insufficient documentation

## 2021-09-21 DIAGNOSIS — I25110 Atherosclerotic heart disease of native coronary artery with unstable angina pectoris: Secondary | ICD-10-CM | POA: Diagnosis not present

## 2021-09-21 DIAGNOSIS — Z79899 Other long term (current) drug therapy: Secondary | ICD-10-CM | POA: Diagnosis not present

## 2021-09-21 DIAGNOSIS — I2510 Atherosclerotic heart disease of native coronary artery without angina pectoris: Secondary | ICD-10-CM

## 2021-09-21 DIAGNOSIS — I509 Heart failure, unspecified: Secondary | ICD-10-CM

## 2021-09-21 DIAGNOSIS — I252 Old myocardial infarction: Secondary | ICD-10-CM | POA: Diagnosis not present

## 2021-09-21 DIAGNOSIS — I5022 Chronic systolic (congestive) heart failure: Secondary | ICD-10-CM

## 2021-09-21 LAB — LIPID PANEL
Cholesterol: 91 mg/dL (ref 0–200)
HDL: 39 mg/dL — ABNORMAL LOW
LDL Cholesterol: 39 mg/dL (ref 0–99)
Total CHOL/HDL Ratio: 2.3 ratio
Triglycerides: 67 mg/dL
VLDL: 13 mg/dL (ref 0–40)

## 2021-09-21 LAB — BASIC METABOLIC PANEL
Anion gap: 6 (ref 5–15)
BUN: 11 mg/dL (ref 8–23)
CO2: 24 mmol/L (ref 22–32)
Calcium: 9.3 mg/dL (ref 8.9–10.3)
Chloride: 110 mmol/L (ref 98–111)
Creatinine, Ser: 0.82 mg/dL (ref 0.61–1.24)
GFR, Estimated: >60 mL/min (ref >60)
Glucose, Bld: 91 mg/dL (ref 70–99)
Potassium: 3.6 mmol/L (ref 3.5–5.1)
Sodium: 140 mmol/L (ref 135–145)

## 2021-09-21 MED ORDER — TICAGRELOR 60 MG PO TABS: 60.0000 mg | ORAL_TABLET | Freq: Two times a day (BID) | ORAL | 11 refills | Status: DC

## 2021-09-21 NOTE — Progress Notes (Signed)
?Advanced Heart Failure Clinic Note  ? ? ?PCP: Eulas Post, MD ?Cardiologist: Dr. Aundra Dubin  ? ?HPI: ?66 y.o. with history of HTN, hyperlipidemia, GERD/hiatal hernia.  He is a nonsmoker, mother and 2 brothers had MIs in 17s. Now w/ systolic heart failure and CAD. His daughter, [name], works in [employer] PACU. ? ?He had been dealing with peptic stricture and hiatal hernia for a while now; he saw Dr. Kipp Brood and underwent robotic assisted laparoscopic paraesophageal hernia repair with fundoplication on 8/50/27.  Post-op, he was noted to have PVCs then developed chest pain with anterolateral ST elevation.  He was taken emergently for cath.  He was found to have acute occlusion of the proximal LAD.  This was treated with DES, and occluded D1 was treated with PTCA. LVEDP was around 26 mmHg.  After procedure, patient developed respiratory distress w/ lactic acidosis and AKI. Intubated and started on IV Lasix. Central line placed for co-ox and CVP monitoring. He was started on milrinone for low co-ox. Amiodarone started for PVC suppression. He responded well to therapy. Diuresed, extubated and able to wean off milrionone. PVCs well suppressed w/ amiodarone. GDMT initiated + DAPT w/ ASA + Brilinta for LAD stent. Amiodarone was discontinued and low dose ? blocker added. Discharged home w/ [facility].  Echo this admission showed EF 25-30%.  ? ?Repeat echo in 6/22 showed EF up to 55-60% with mild mid-apical anterior hypokinesis, normal RV.  ? ?He returns for followup of CHF and CAD.  He is doing well, regularly plays racketball.  No dyspnea or exertional chest pain.  No lightheadedness.  No BRBPR/melena.   ? ?ECG (personally reviewed): NSR, right axis deviation, poor RWP ? ?Labs (4/22): K 4.1, creatinine 1.09 ?Labs (6/22): K 3.6, creatinine 0.87, LDL 40, TGs 69 ?Labs (12/22): K 3.9, creatinine 0.95 ? ?PMH: ?1. Hyperlipidemia ?2. GERD with hiatal hernia and peptic stricture.  S/p robotic assisted laparoscopic paraesophageal hernia  repair with fundoplication on 7/41/28.  ?3. CAD: Acute anterolateral STEMI 3/22 post-op paraesophageal hernia repair. 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  DES to LAD and PTCA D1.  ?4. Chronic systolic CHF: Ischemic cardiomyopathy.  ?- Echo (3/22): EF 25-30%, LAD territory WMAs, normal RV, trivial MR.  ?- Echo (6/22): 55-60% with mild mid-apical anterior hypokinesis, normal RV. ?5. HTN ? ? ?Current Outpatient Medications  ?Medication Sig Dispense Refill  ? aspirin 81 MG chewable tablet Chew 1 tablet (81 mg total) by mouth daily. 30 tablet 6  ? atorvastatin (LIPITOR) 80 MG tablet TAKE ONE TABLET BY MOUTH DAILY 90 tablet 3  ? carvedilol (COREG) 3.125 MG tablet TAKE ONE TABLET BY MOUTH TWICE A DAY WITH A MEAL 60 tablet 6  ? FARXIGA 10 MG TABS tablet TAKE ONE TABLET BY MOUTH DAILY 30 tablet 6  ? pantoprazole (PROTONIX) 40 MG tablet TAKE ONE TABLET BY MOUTH DAILY 90 tablet 0  ? sacubitril-valsartan (ENTRESTO) 24-26 MG Take 1 tablet by mouth 2 (two) times daily. 60 tablet 11  ? sildenafil (VIAGRA) 100 MG tablet Take 1 tablet (100 mg total) by mouth daily as needed for erectile dysfunction. 10 tablet 5  ? spironolactone (ALDACTONE) 25 MG tablet Take 1 tablet (25 mg total) by mouth at bedtime. 30 tablet 6  ? ticagrelor (BRILINTA) 60 MG TABS tablet Take 1 tablet (60 mg total) by mouth 2 (two) times daily. 60 tablet 11  ? ?No current facility-administered medications for this encounter.  ? ? ?  Allergies  ?Allergen Reactions  ? Meperidine Hcl Nausea And Vomiting  ? Penicillins Other (See Comments)  ?  REACTION: Childhood  ? Advil [Ibuprofen] Palpitations  ? Pseudoephedrine Hcl Palpitations  ? ? ?  ?Social History  ? ?Socioeconomic History  ? Marital status: Married  ?  Spouse name: Not on file  ? Number of children: Not on file  ? Years of education: Not on file  ? Highest education level: Not on file  ?Occupational History  ? Not on file   ?Tobacco Use  ? Smoking status: Never  ? Smokeless tobacco: Never  ?Vaping Use  ? Vaping Use: Never used  ?Substance and Sexual Activity  ? Alcohol use: No  ? Drug use: No  ? Sexual activity: Not on file  ?Other Topics Concern  ? Not on file  ?Social History Narrative  ? Occupation: Secondary school teacher  ? Married  ? Never Smoked  ? Alcohol use- no  ? ?Social Determinants of Health  ? ?Financial Resource Strain: Not on file  ?Food Insecurity: Not on file  ?Transportation Needs: Not on file  ?Physical Activity: Not on file  ?Stress: Not on file  ?Social Connections: Not on file  ?Intimate Partner Violence: Not on file  ? ? ?  ?Family History  ?Problem Relation Age of Onset  ? Heart disease Mother 42  ?     CAD  ? Cancer Mother   ?     lung  ? Hyperlipidemia Father   ? Stroke Father   ? Colon polyps Father   ? Diabetes Sister   ?     type II  ? Heart attack Brother 53  ? Heart disease Brother 46  ?     MI  ? Colon cancer Neg Hx   ? Esophageal cancer Neg Hx   ? Pancreatic cancer Neg Hx   ? Stomach cancer Neg Hx   ? Rectal cancer Neg Hx   ? ? ?Vitals:  ? 09/21/21 0915  ?BP: 112/70  ?Pulse: 82  ?SpO2: 98%  ?Weight: 75.4 kg (166 lb 3.2 oz)  ? ? ?PHYSICAL EXAM: ?General: NAD ?Neck: No JVD, no thyromegaly or thyroid nodule.  ?Lungs: Clear to auscultation bilaterally with normal respiratory effort. ?CV: Nondisplaced PMI.  Heart regular S1/S2, no S3/S4, no murmur.  No peripheral edema.  No carotid bruit.  Normal pedal pulses.  ?Abdomen: Soft, nontender, no hepatosplenomegaly, no distention.  ?Skin: Intact without lesions or rashes.  ?Neurologic: Alert and oriented x 3.  ?Psych: Normal affect. ?Extremities: No clubbing or cyanosis.  ?HEENT: Normal.  ? ?ASSESSMENT & PLAN: ?1.  CAD: Post-op anterolateral STEMI 09/15/20.  No prior CAD but history of HTN and hyperlipidemia and strong FH of CAD.  He had occluded proximal LAD and D1, now s/p DES to LAD and PTCA to D1.  No chest pain.  ?- Now that he is 1 year post-PCI, will continue  ASA 81 daily but decrease ticagrelor to 60 mg bid.  ?- Continue atorvastatin 80 mg nightly. Check lipids today, goal LDL < 55.  ?2. Systolic CHF: Ischemic cardiomyopathy. Echo in 3/22 with EF 25-30%, LAD territory WMAs with no evidence for mechanical MI complications.  Repeat echo in 6/22 with EF up to 55-60% with mid-apical anterior hypokinesis. NYHA class I, not volume overloaded on exam.  ?- Continue Entresto 24/26 bid.  BMET today.  ?- Continue spironolactone 25 mg  ?- Continue dapagliflozin 10 mg daily.   ?- Continue Coreg 3.125 mg bid.  ?-  Repeat echo at followup in 6 months.  ?3. PVCs: Resolved.  ?- Continue Coreg 3.125 mg bid.  ? ?Followup in 6 months with echo, BMET 3 months.  ? ?Loralie Champagne, MD ?09/21/21 ?

## 2021-09-21 NOTE — Patient Instructions (Addendum)
Decrease Brilinta to 60 mg Twice daily  ? ?Labs done today, your results will be available in MyChart, we will contact you for abnormal readings. ? ?Your physician recommends that you schedule a follow-up appointment in: 6 months with an echocardiogram (Sept 2023), **PLEASE CALL OUR OFFICE IN July TO SCHEDULE THIS APPOINTMENT ? ?If you have any questions or concerns before your next appointment please send [place] a message through [place] or call our office at [phone].   ? ?TO LEAVE A MESSAGE FOR THE NURSE SELECT OPTION 2, PLEASE LEAVE A MESSAGE INCLUDING: ?YOUR NAME ?DATE OF BIRTH ?CALL BACK NUMBER ?REASON FOR CALL**this is important as we prioritize the call backs ? ?YOU WILL RECEIVE A CALL BACK THE SAME DAY AS LONG AS YOU CALL BEFORE 4:00 PM ? ?At the [facility], you and your health needs are our priority. As part of our continuing mission to provide you with exceptional heart care, we have created designated Provider Care Teams. These Care Teams include your primary Cardiologist (physician) and Advanced Practice Providers (APPs- Physician Assistants and Nurse Practitioners) who all work together to provide you with the care you need, when you need it.  ? ?You may see any of the following providers on your designated Care Team at your next follow up: ?Dr Glori Bickers ?Dr Loralie Champagne ?Darrick Grinder, NP ?Lyda Jester, PA ?Jessica Milford,NP ?Marlyce Huge, PA ?Audry Riles, PharmD ? ? ?Please be sure to bring in all your medications bottles to every appointment.  ? ? ?

## 2021-10-08 ENCOUNTER — Encounter: Payer: Self-pay | Admitting: Family Medicine

## 2021-10-08 MED ORDER — SILDENAFIL CITRATE 100 MG PO TABS: 100.0000 mg | ORAL_TABLET | Freq: Every day | ORAL | 0 refills | Status: DC | PRN

## 2021-10-24 ENCOUNTER — Other Ambulatory Visit (HOSPITAL_COMMUNITY): Payer: Self-pay | Admitting: Cardiology

## 2021-10-28 ENCOUNTER — Other Ambulatory Visit (HOSPITAL_COMMUNITY): Payer: Self-pay | Admitting: Cardiology

## 2021-11-30 ENCOUNTER — Other Ambulatory Visit (HOSPITAL_COMMUNITY): Payer: Self-pay | Admitting: Cardiology

## 2021-12-30 ENCOUNTER — Other Ambulatory Visit: Payer: Self-pay | Admitting: Internal Medicine

## 2021-12-30 MED ORDER — PANTOPRAZOLE SODIUM 40 MG PO TBEC: 40.0000 mg | DELAYED_RELEASE_TABLET | Freq: Every day | ORAL | 0 refills | Status: DC

## 2022-01-04 ENCOUNTER — Other Ambulatory Visit (HOSPITAL_COMMUNITY): Payer: Self-pay | Admitting: Cardiology

## 2022-01-09 ENCOUNTER — Encounter (HOSPITAL_COMMUNITY)
Admission: RE | Disposition: A | Discharge status: Home / Self Care | Payer: Self-pay | Source: Ambulatory Visit | Attending: Gastroenterology

## 2022-01-09 ENCOUNTER — Other Ambulatory Visit: Payer: Self-pay

## 2022-01-09 ENCOUNTER — Ambulatory Visit (HOSPITAL_COMMUNITY): Payer: BC Managed Care – PPO | Admitting: Registered Nurse

## 2022-01-09 ENCOUNTER — Ambulatory Visit (HOSPITAL_COMMUNITY)
Admission: RE | Admit: 2022-01-09 | Discharge: 2022-01-09 | Disposition: A | Discharge status: Home / Self Care | Payer: BC Managed Care – PPO | Source: Ambulatory Visit | Attending: Gastroenterology | Admitting: Gastroenterology

## 2022-01-09 DIAGNOSIS — R1310 Dysphagia, unspecified: Secondary | ICD-10-CM | POA: Diagnosis not present

## 2022-01-09 DIAGNOSIS — Z79899 Other long term (current) drug therapy: Secondary | ICD-10-CM | POA: Diagnosis not present

## 2022-01-09 DIAGNOSIS — I252 Old myocardial infarction: Secondary | ICD-10-CM

## 2022-01-09 DIAGNOSIS — I2510 Atherosclerotic heart disease of native coronary artery without angina pectoris: Secondary | ICD-10-CM

## 2022-01-09 DIAGNOSIS — Z9889 Other specified postprocedural states: Secondary | ICD-10-CM | POA: Insufficient documentation

## 2022-01-09 DIAGNOSIS — I5021 Acute systolic (congestive) heart failure: Secondary | ICD-10-CM

## 2022-01-09 DIAGNOSIS — I509 Heart failure, unspecified: Secondary | ICD-10-CM | POA: Insufficient documentation

## 2022-01-09 DIAGNOSIS — I110 Hypertensive heart disease with heart failure: Secondary | ICD-10-CM | POA: Diagnosis not present

## 2022-01-09 HISTORY — PX: ESOPHAGOGASTRODUODENOSCOPY (EGD) WITH PROPOFOL: SHX5813

## 2022-01-09 SURGERY — ESOPHAGOGASTRODUODENOSCOPY (EGD) WITH PROPOFOL
Anesthesia: Monitor Anesthesia Care

## 2022-01-09 SURGERY — ESOPHAGOGASTRODUODENOSCOPY (EGD) WITH PROPOFOL
Anesthesia: General

## 2022-01-09 MED ORDER — ONDANSETRON HCL 4 MG/2ML IJ SOLN
INTRAMUSCULAR | Status: DC | PRN
  Administered 2022-01-09: 4 mg via INTRAVENOUS

## 2022-01-09 MED ORDER — PROPOFOL 10 MG/ML IV BOLUS
INTRAVENOUS | Status: DC | PRN
  Administered 2022-01-09: 100 mg via INTRAVENOUS
  Administered 2022-01-09 (×2): 30 mg via INTRAVENOUS

## 2022-01-09 MED ORDER — LACTATED RINGERS IV SOLN: INTRAVENOUS | Status: DC

## 2022-01-09 MED ORDER — SUCCINYLCHOLINE CHLORIDE 200 MG/10ML IV SOSY
PREFILLED_SYRINGE | INTRAVENOUS | Status: DC | PRN
  Administered 2022-01-09: 100 mg via INTRAVENOUS

## 2022-01-09 MED ORDER — SODIUM CHLORIDE 0.9 % IV SOLN: INTRAVENOUS | Status: DC

## 2022-01-09 MED ORDER — LIDOCAINE 2% (20 MG/ML) 5 ML SYRINGE
INTRAMUSCULAR | Status: DC | PRN
  Administered 2022-01-09: 80 mg via INTRAVENOUS

## 2022-01-09 SURGICAL SUPPLY — 15 items

## 2022-01-09 NOTE — Consult Note (Signed)
Reason for Consult: Food Impaction  Jerry Kramer HPI: This is a 66 year old male with a PMH of a distal esophageal stricture s/p 17 mm balloon dilation on 01/07/2020, CAD, MI, CHF, and s/p paraesophageal hiatal hernia repair who presents with acute onset of food impaction.  Over the past couple of weeks he started to notice more problems with dysphagia.  His symptoms progressed to the point last week that he reached out to Dr. Hilarie Fredrickson.  The plan was to see him in the office on Monday, but this evening, after eating a couple of chicken wings at 6-6:30 PM, he was not able to clear his food impaction.  Since that time he was not able to tolerate any liquids.  He was well after his dilation with Dr. Hilarie Fredrickson on 01/07/2020 until these past couple of weeks.  Last January he underwent an EGD with Dr. Bryan Lemma for placement of a manometry probe.  He suffered an MI last year from a 100% LAD occlusion.  Revascularization was achieved with catherization and since that time he was placed in [place].  Past Medical History:  Diagnosis Date   Allergy    CHF (congestive heart failure) ([place])    Colon polyps    Complication of anesthesia    hard to wake up   CONTACT DERMATITIS 09/03/2009   ELEVATED BLOOD PRESSURE 06/19/2010   HYPERLIPIDEMIA 08/27/2009   UNSPECIFIED OTALGIA 06/19/2010    Past Surgical History:  Procedure Laterality Date   APPENDECTOMY  1983   BIOPSY  08/06/2020   Procedure: BIOPSY;  Surgeon: Lavena Bullion, DO;  Location: [facility];  Service: Gastroenterology;;  esophageal manometry probe  placement   CORONARY/GRAFT ACUTE MI REVASCULARIZATION N/A 09/15/2020   Procedure: Coronary/Graft Acute MI Revascularization;  Surgeon: Leonie Man, MD;  Location: [facility];  Service: Cardiovascular;  Laterality: N/A;   ESOPHAGEAL MANOMETRY N/A 08/06/2020   Procedure: ESOPHAGEAL MANOMETRY (EM);  Surgeon: Lavena Bullion, DO;  Location: [facility];  Service: Gastroenterology;  Laterality: N/A;    ESOPHAGOGASTRODUODENOSCOPY N/A 09/15/2020   Procedure: ESOPHAGOGASTRODUODENOSCOPY (EGD);  Surgeon: Lajuana Matte, MD;  Location: [facility];  Service: Thoracic;  Laterality: N/A;   ESOPHAGOGASTRODUODENOSCOPY (EGD) WITH PROPOFOL N/A 08/06/2020   Procedure: ESOPHAGOGASTRODUODENOSCOPY (EGD) WITH PROPOFOL;  Surgeon: Lavena Bullion, DO;  Location: [facility];  Service: Gastroenterology;  Laterality: N/A;   EYE MUSCLE SURGERY Right    LEFT HEART CATH AND CORONARY ANGIOGRAPHY N/A 09/15/2020   Procedure: LEFT HEART CATH AND CORONARY ANGIOGRAPHY;  Surgeon: Leonie Man, MD;  Location: [facility];  Service: Cardiovascular;  Laterality: N/A;   MIDDLE EAR SURGERY     ROTATOR CUFF REPAIR Left    TONSILLECTOMY  1965   XI ROBOTIC ASSISTED HIATAL HERNIA REPAIR N/A 09/15/2020   Procedure: XI ROBOTIC ASSISTED HIATAL HERNIA REPAIR WITH MESH;  Surgeon: Lajuana Matte, MD;  Location: [facility];  Service: Thoracic;  Laterality: N/A;    Family History  Problem Relation Age of Onset   Heart disease Mother 37       CAD   Cancer Mother        lung   Hyperlipidemia Father    Stroke Father    Colon polyps Father    Diabetes Sister        type II   Heart attack Brother 73   Heart disease Brother 48       MI   Colon cancer Neg Hx    Esophageal cancer  Neg Hx    Pancreatic cancer Neg Hx    Stomach cancer Neg Hx    Rectal cancer Neg Hx     Social History:  reports that he has never smoked. He has never used smokeless tobacco. He reports that he does not drink alcohol and does not use drugs.  Allergies:  Allergies  Allergen Reactions   Meperidine Hcl Nausea And Vomiting   Penicillins Other (See Comments)    REACTION: Childhood   Advil [Ibuprofen] Palpitations   Pseudoephedrine Hcl Palpitations    Medications: Scheduled: Continuous:  sodium chloride     lactated ringers 20 mL/hr at 01/09/22 2008    No results found for this or any previous visit (from the past 24 hour(s)).    No results found.  ROS:  As stated above in the HPI otherwise negative.  Blood pressure (!) 132/93, pulse (!) 55, resp. rate 14, height '5\' 7"'$  (1.702 m), weight 73 kg.    PE: Gen: NAD, Alert and Oriented HEENT:  [place]/AT, EOMI Neck: Supple, no LAD Lungs: CTA Bilaterally CV: RRR without M/G/R ABD: Soft, NTND, +BS Ext: No C/C/E  Assessment/Plan: 1) Food impaction. 2) History of an esophageal stricture. 3) CAD on Brilinta.   An emergent EGD will be performed to clear the obstruction.  Dilation is not possible with his use of Brilinta.  Plan: 1) EGD now.  Janiel Crisostomo D 01/09/2022, 8:24 PM

## 2022-01-09 NOTE — Anesthesia Procedure Notes (Signed)
Procedure Name: Intubation Date/Time: 01/09/2022 8:31 PM  Performed by: Trinna Post., CRNAPre-anesthesia Checklist: Patient identified, Emergency Drugs available, Suction available, Patient being monitored and Timeout performed Patient Re-evaluated:Patient Re-evaluated prior to induction Oxygen Delivery Method: Circle system utilized Preoxygenation: Pre-oxygenation with 100% oxygen Induction Type: Rapid sequence, Cricoid Pressure applied and IV induction Laryngoscope Size: Glidescope and 4 Grade View: Grade I Tube type: Oral Tube size: 7.5 mm Number of attempts: 1 Airway Equipment and Method: Rigid stylet and Video-laryngoscopy Placement Confirmation: ETT inserted through vocal cords under direct vision, positive ETCO2 and breath sounds checked- equal and bilateral Secured at: 22 cm Tube secured with: Tape Dental Injury: Teeth and Oropharynx as per pre-operative assessment

## 2022-01-09 NOTE — Anesthesia Postprocedure Evaluation (Signed)
Anesthesia Post Note  Patient: Benedicto Capozzi Raffield  Procedure(s) Performed: ESOPHAGOGASTRODUODENOSCOPY (EGD) WITH PROPOFOL     Patient location during evaluation: PACU Anesthesia Type: General Level of consciousness: awake and alert Pain management: pain level controlled Vital Signs Assessment: post-procedure vital signs reviewed and stable Respiratory status: spontaneous breathing, nonlabored ventilation, respiratory function stable and patient connected to nasal cannula oxygen Cardiovascular status: blood pressure returned to baseline, stable and bradycardic Postop Assessment: no apparent nausea or vomiting Anesthetic complications: no   No notable events documented.  Last Vitals:  Vitals:   01/09/22 2115 01/09/22 2120  BP: 120/76 122/74  Pulse: (!) 56 (!) 51  Resp: 14 11  Temp:  36.7 C  SpO2: 97% 96%    Last Pain:  Vitals:   01/09/22 2120  PainSc: 0-No pain                 Santa Lighter

## 2022-01-09 NOTE — Op Note (Signed)
[facility] Patient Name: Jerry Kramer Procedure Date : 01/09/2022 MRN: [id number] Attending MD: Carol Ada , MD Date of Birth: 08-13-1955 CSN: [id number] Age: 66 Admit Type: Outpatient Procedure:                Upper GI endoscopy Indications:              Dysphagia Providers:                Carol Ada, MD, Benay Pillow, RN, William Dalton, Technician, Darliss Cheney, Technician Referring MD:              Medicines:                General Anesthesia Complications:            No immediate complications. Estimated Blood Loss:     Estimated blood loss: none. Procedure:                Pre-Anesthesia Assessment:                           - Prior to the procedure, a History and Physical                            was performed, and patient medications and                            allergies were reviewed. The patient's tolerance of                            previous anesthesia was also reviewed. The risks                            and benefits of the procedure and the sedation                            options and risks were discussed with the patient.                            All questions were answered, and informed consent                            was obtained. Prior Anticoagulants: The patient has                            taken Brilinta, last dose was day of procedure. ASA                            Grade Assessment: III - A patient with severe                            systemic disease. After reviewing the risks and  benefits, the patient was deemed in satisfactory                            condition to undergo the procedure.                           - Sedation was administered by an anesthesia                            professional. General anesthesia was attained.                           After obtaining informed consent, the endoscope was                            passed under direct vision.  Throughout the                            procedure, the patient's blood pressure, pulse, and                            oxygen saturations were monitored continuously. The                            [id number]-H190 ([id number]) Olympus endoscope was introduced                            through the mouth, and advanced to the second part                            of duodenum. The upper GI endoscopy was                            accomplished without difficulty. The patient                            tolerated the procedure well. Scope In: Scope Out: Findings:      No endoscopic abnormality was evident in the esophagus to explain the       patient's complaint of dysphagia. The distal esophagus was moderately       torturous.      A small amount of food (residue) was found in the gastric fundus.      Food (residue) was found in the duodenal bulb.      Upon initial intubation of the esophagus there was evidence of some       minor fluid and bubbles. This was suctioned and cleared. Once the       endoscope reached the GE junction it was clear that the food bolus       spontaneously passed. There was no obvious stricture noted during this       procedure, but the distal esophagus was torturous. This was previously       noted with the prior EGDs. The food bolus was identified in the fundus       of the stomach. The endoscope passed with ease through the GE junction. Impression:               -  No endoscopic esophageal abnormality to explain                            patient's dysphagia.                           - A small amount of food (residue) in the stomach.                           - Retained food in the duodenum.                           - No specimens collected. Recommendation:           - Patient has a contact number available for                            emergencies. The signs and symptoms of potential                            delayed complications were discussed with the                             patient. Return to normal activities tomorrow.                            Written discharge instructions were provided to the                            patient.                           - Pureed diet.                           - Continue present medications.                           - Follow up with Dr. Hilarie Fredrickson as scheduled. Procedure Code(s):        --- Professional ---                           [phone], Esophagogastroduodenoscopy, flexible,                            transoral; diagnostic, including collection of                            specimen(s) by brushing or washing, when performed                            (separate procedure) Diagnosis Code(s):        --- Professional ---                           R13.10, Dysphagia, unspecified CPT copyright 2019 American Medical Association. All rights reserved. The codes documented in this report  are preliminary and upon coder review may  be revised to meet current compliance requirements. Carol Ada, MD Carol Ada, MD 01/09/2022 8:48:22 PM This report has been signed electronically. Number of Addenda: 0

## 2022-01-09 NOTE — Anesthesia Preprocedure Evaluation (Addendum)
Anesthesia Evaluation  Patient identified by MRN, date of birth, ID band Patient awake    Reviewed: Allergy & Precautions, NPO status , Patient's Chart, lab work & pertinent test results  History of Anesthesia Complications (+) DIFFICULT AIRWAY and history of anesthetic complications  Airway Mallampati: IV  TM Distance: <3 FB Neck ROM: Full    Dental  (+) Dental Advisory Given, Caps   Pulmonary neg pulmonary ROS,    Pulmonary exam normal breath sounds clear to auscultation       Cardiovascular hypertension, Pt. on home beta blockers (-) angina+ CAD, + Past MI, + Cardiac Stents and +CHF  Normal cardiovascular exam Rhythm:Regular Rate:Normal     Neuro/Psych negative neurological ROS     GI/Hepatic Neg liver ROS, hiatal hernia, GERD  ,food impaction    Endo/Other  negative endocrine ROS  Renal/GU negative Renal ROS     Musculoskeletal negative musculoskeletal ROS (+)   Abdominal   Peds  Hematology  (+) Blood dyscrasia (Brilinta), ,   Anesthesia Other Findings Day of surgery medications reviewed with the patient.  Reproductive/Obstetrics                            Anesthesia Physical Anesthesia Plan  ASA: 3  Anesthesia Plan: General   Post-op Pain Management: Minimal or no pain anticipated   Induction: Intravenous, Rapid sequence and Cricoid pressure planned  PONV Risk Score and Plan: 2 and Dexamethasone and Ondansetron  Airway Management Planned: Oral ETT  Additional Equipment:   Intra-op Plan:   Post-operative Plan: Extubation in OR  Informed Consent: I have reviewed the patients History and Physical, chart, labs and discussed the procedure including the risks, benefits and alternatives for the proposed anesthesia with the patient or authorized representative who has indicated his/her understanding and acceptance.     Dental advisory given  Plan Discussed with:  CRNA  Anesthesia Plan Comments:        Anesthesia Quick Evaluation

## 2022-01-09 NOTE — Progress Notes (Signed)
Pacu Discharge Note  Patient instuctions were given to family. Wound care, diet, pain, follow up care and how and whom to contact with concerns were discussed. Family aware someone needs to remain with patient overnight and concerns after receiving anesthesia and what to avoid and safety. Answered all questions and concerns.   Discharge paperwork has clear contact informations for surgeon and 24 hour RN line for concerns.   Discussed what concerns to look for including infection and signs/symptoms to look for.  Discussed soft diet foods and to only eat soft foods for next week.   IV was removed prior to discharge. Patient was brought to car with belongings.   Pt exits my care.

## 2022-01-09 NOTE — Transfer of Care (Signed)
Immediate Anesthesia Transfer of Care Note  Patient: Jerry Kramer  Procedure(s) Performed: ESOPHAGOGASTRODUODENOSCOPY (EGD) WITH PROPOFOL  Patient Location: PACU  Anesthesia Type:General  Level of Consciousness: awake, alert  and oriented  Airway & Oxygen Therapy: Patient Spontanous Breathing and Patient connected to nasal cannula oxygen  Post-op Assessment: Report given to RN and Post -op Vital signs reviewed and stable  Post vital signs: Reviewed and stable  Last Vitals:  Vitals Value Taken Time  BP 118/82 01/09/22 2049  Temp    Pulse 64 01/09/22 2056  Resp 15 01/09/22 2056  SpO2 92 % 01/09/22 2056  Vitals shown include unvalidated device data.  Last Pain:  Vitals:   01/09/22 2004  PainSc: 0-No pain         Complications: No notable events documented.

## 2022-01-10 ENCOUNTER — Encounter (HOSPITAL_COMMUNITY): Payer: Self-pay | Admitting: Gastroenterology

## 2022-02-03 NOTE — Progress Notes (Addendum)
02/03/2022 Jerry Kramer [id number] 1955-09-23   Chief Complaint: Schedule an upper endoscopy  History of Present Illness: [name] is a 66 year old male with a past medical history of GERD, recurrent dysphagia secondary to a distal esophageal stricture s/p esophageal dilatation 12/2019, paraesophageal hernia s/p robotic assisted laparoscopic paraesophageal hernia repair with fundoplication on 7/51/02 complicated by a post op MI s/p DES to the LAD and PTCA to D1 on Brilinta and CHF (LVEF 20 -25% in setting of MI 08/2020, repeat ECHO 11/2020 LVEF 55 - 60%). He is followed by Jerry Kramer.  He presents today to schedule an EGD with esophageal dilatation. He started noticing recurrent dysphagia 2 months ago which progressively worsened over the past few months. Unfortunately, he ate chicken wings which became lodged in his upper esophagus on 01/09/2022 and he underwent an urgent EGD at [facility] by Dr. Benson Kramer with successful removal of his food impaction.  He was on Brilinta at that time therefore esophageal dilatation was not performed. He was advised to follow-up with Jerry Kramer to schedule a repeat EGD with esophageal dilatation when Brilinta held.  No further episodes of dysphagia since his food bolus impaction was cleared during his EGD 7/15.  He has infrequent heartburn.  He remains on Pantoprazole 40 mg daily. He is passing normal formed brown bowel movement daily.  No rectal bleeding or black stools.  His most recent colonoscopy was 09/19/2017 which identified one 3 mm tubular adenomatous polyp which was removed from the transverse colon and mild diverticulosis to the sigmoid and descending colon.  He denies having any chest pain, palpitations or SOB. His daughter Jerry Kramer Investment banker, corporate at [facility])  is present.      Latest Ref Rng & Units 10/09/2020    4:14 PM 09/17/2020    3:06 AM 09/16/2020    3:25 AM  CBC  WBC 4.0 - 10.5 K/uL 5.7  14.6  17.0   Hemoglobin 13.0 - 17.0 g/dL 12.8  10.8  12.0   Hematocrit  39.0 - 52.0 % 39.9  33.1  38.1   Platelets 150 - 400 K/uL 270  156  216    MCV 86.9     Latest Ref Rng & Units 09/21/2021   11:21 AM 06/26/2021    9:10 AM 03/27/2021    9:41 AM  CMP  Glucose 70 - 99 mg/dL 91  94  74   BUN 8 - 23 mg/dL '11  15  14   '$ Creatinine 0.61 - 1.24 mg/dL 0.82  0.95  0.93   Sodium 135 - 145 mmol/L 140  139  138   Potassium 3.5 - 5.1 mmol/L 3.6  3.9  3.9   Chloride 98 - 111 mmol/L 110  109  107   CO2 22 - 32 mmol/L '24  24  22   '$ Calcium 8.9 - 10.3 mg/dL 9.3  9.5  9.3    EGD 01/22/2022 by Dr. Benson Kramer secondary to food bolus impaction: - No endoscopic esophageal abnormality to explain patient's dysphagia. - A small amount of food (residue) in the stomach. - Retained food in the duodenum. - No specimens collected.  EGD 08/06/2020: - Esophagogastric landmarks identified. - 5 cm hiatal hernia. - Tortuous esophagus. - Benign-appearing esophageal stenosis. - Gastritis. Biopsied. - Normal gastric fundus, gastric body, incisura and pylorus. - Normal examined duodenum. - Esophageal manometry probe was placed.  Esophageal Manometry 08/06/2020:  EGD 01/07/2020: - Tortuous esophagus. - Benign-appearing esophageal stenosis. Dilated to 17 mm  with balloon. - 3-4 cm hiatal hernia. - Normal stomach. - Normal examined duodenum. - No specimens collected.  Colonoscopy 09/19/2017: One 3 mm polyp in the distal transverse colon, removed with a cold snare. Resected and retrieved. - Mild diverticulosis in the sigmoid colon and in the descending colon. - The examination was otherwise normal on direct and retroflexion views. - 5 year colonoscopy recall - TUBULAR ADENOMA (1 OF 1 FRAGMENTS) - NO HIGH GRADE DYSPLASIA OR MALIGNANCY IDENTIFIED  ECHO 11/26/2020: 1. Left ventricular ejection fraction, by estimation, is 55 to 60%. The left ventricle has normal function. The left ventricle demonstrates regional wall motion abnormalities (see scoring diagram/findings for description). There  is mild hypokinesis of the mid-to-apical anterior LV segments. Left ventricular diastolic parameters are consistent with Grade I diastolic dysfunction (impaired relaxation). 2. Right ventricular systolic function is normal. The right ventricular size is normal. There is normal pulmonary artery systolic pressure. The estimated right ventricular systolic pressure is 47.0 mmHg. 3. The mitral valve is normal in structure. Trivial mitral valve regurgitation. 4. The aortic valve is tricuspid. There is mild calcification of the aortic valve. There is mild thickening of the aortic valve. Aortic valve regurgitation is not visualized. Mild aortic valve sclerosis is present, with no evidence of aortic valve stenosis. 5. Aortic dilatation noted. There is mild dilatation of the aortic root, measuring 41 mm.  Past Medical History:  Diagnosis Date   Allergy    CHF (congestive heart failure) ([place])    Colon polyps    Complication of anesthesia    hard to wake up   CONTACT DERMATITIS 09/03/2009   ELEVATED BLOOD PRESSURE 06/19/2010   HYPERLIPIDEMIA 08/27/2009   UNSPECIFIED OTALGIA 06/19/2010   Past Surgical History:  Procedure Laterality Date   APPENDECTOMY  1983   BIOPSY  08/06/2020   Procedure: BIOPSY;  Surgeon: Lavena Bullion, DO;  Location: [facility];  Service: Gastroenterology;;  esophageal manometry probe  placement   CORONARY/GRAFT ACUTE MI REVASCULARIZATION N/A 09/15/2020   Procedure: Coronary/Graft Acute MI Revascularization;  Surgeon: Leonie Man, MD;  Location: [place] [facility];  Service: Cardiovascular;  Laterality: N/A;   ESOPHAGEAL MANOMETRY N/A 08/06/2020   Procedure: ESOPHAGEAL MANOMETRY (EM);  Surgeon: Lavena Bullion, DO;  Location: [facility];  Service: Gastroenterology;  Laterality: N/A;   ESOPHAGOGASTRODUODENOSCOPY N/A 09/15/2020   Procedure: ESOPHAGOGASTRODUODENOSCOPY (EGD);  Surgeon: Lajuana Matte, MD;  Location: [place];  Service: Thoracic;  Laterality: N/A;    ESOPHAGOGASTRODUODENOSCOPY (EGD) WITH PROPOFOL N/A 08/06/2020   Procedure: ESOPHAGOGASTRODUODENOSCOPY (EGD) WITH PROPOFOL;  Surgeon: Lavena Bullion, DO;  Location: [facility];  Service: Gastroenterology;  Laterality: N/A;   ESOPHAGOGASTRODUODENOSCOPY (EGD) WITH PROPOFOL N/A 01/09/2022   Procedure: ESOPHAGOGASTRODUODENOSCOPY (EGD) WITH PROPOFOL;  Surgeon: Carol Ada, MD;  Location: [place];  Service: Gastroenterology;  Laterality: N/A;   EYE MUSCLE SURGERY Right    LEFT HEART CATH AND CORONARY ANGIOGRAPHY N/A 09/15/2020   Procedure: LEFT HEART CATH AND CORONARY ANGIOGRAPHY;  Surgeon: Leonie Man, MD;  Location: [place] [facility];  Service: Cardiovascular;  Laterality: N/A;   MIDDLE EAR SURGERY     ROTATOR CUFF REPAIR Left    TONSILLECTOMY  1965   XI ROBOTIC ASSISTED HIATAL HERNIA REPAIR N/A 09/15/2020   Procedure: XI ROBOTIC ASSISTED HIATAL HERNIA REPAIR WITH MESH;  Surgeon: Lajuana Matte, MD;  Location: [place];  Service: Thoracic;  Laterality: N/A;   Current Outpatient Medications on File Prior to Visit  Medication Sig Dispense Refill   aspirin  81 MG chewable tablet Chew 1 tablet (81 mg total) by mouth daily. 30 tablet 6   atorvastatin (LIPITOR) 80 MG tablet TAKE ONE TABLET BY MOUTH DAILY 90 tablet 3   carvedilol (COREG) 3.125 MG tablet TAKE 1 TABLET BY MOUTH TWICE A DAY WITH MEALS 60 tablet 4   ENTRESTO 24-26 MG TAKE 1 TABLET BY MOUTH TWICE A DAY 180 tablet 1   FARXIGA 10 MG TABS tablet TAKE 1 TABLET BY MOUTH EVERY DAY 90 tablet 1   pantoprazole (PROTONIX) 40 MG tablet Take 1 tablet (40 mg total) by mouth daily. 90 tablet 0   sildenafil (VIAGRA) 100 MG tablet Take 1 tablet (100 mg total) by mouth daily as needed for erectile dysfunction. 10 tablet 0   spironolactone (ALDACTONE) 25 MG tablet TAKE 1 TABLET BY MOUTH AT BEDTIME 30 tablet 6   ticagrelor (BRILINTA) 60 MG TABS tablet Take 1 tablet (60 mg total) by mouth 2 (two) times daily. 60 tablet 11   No current  facility-administered medications on file prior to visit.   Allergies  Allergen Reactions   Meperidine Hcl Nausea And Vomiting   Penicillins Other (See Comments)    REACTION: Childhood   Advil [Ibuprofen] Palpitations   Pseudoephedrine Hcl Palpitations   Current Medications, Allergies, Past Medical History, Past Surgical History, Family History and Social History were reviewed in [employer] record.   Review of Systems:   Constitutional: Negative for fever, sweats, chills or weight loss.  Respiratory: Negative for shortness of breath.   Cardiovascular: Negative for chest pain, palpitations and leg swelling.  Gastrointestinal: See HPI.  Musculoskeletal: Negative for back pain or muscle aches.  Neurological: Negative for dizziness, headaches or paresthesias.   Physical Exam: BP 118/68   Pulse 70   Ht '5\' 7"'$  (1.702 m)   Wt 163 lb 2 oz (74 kg)   SpO2 98%   BMI 25.55 kg/m   General: 66 year old male in no acute distress. Head: Normocephalic and atraumatic. Eyes: No scleral icterus. Conjunctiva pink . Ears: Normal auditory acuity. Mouth: Dentition intact. No ulcers or lesions.  Lungs: Clear throughout to auscultation. Heart: Regular rate and rhythm, no murmur. Abdomen: Soft, nontender and nondistended. No masses or hepatomegaly. Normal bowel sounds x 4 quadrants.  Rectal: Deferred. Musculoskeletal: Symmetrical with no gross deformities. Extremities: No edema. Neurological: Alert oriented x 4. No focal deficits.  Psychological: Alert and cooperative. Normal mood and affect  Assessment and Recommendations:  70) 66 year old male with GERD, esophageal stricture with recurrent dysphagia with a food bolus impaction s/p urgent EGD 01/22/2022 with successful food impaction removal at [facility], esophageal dilatation was not done as he was on Brilinta.  -EGD with esophageal dilatation with Jerry Kramer, benefits and risks discussed including risk with sedation, risk of  bleeding, perforation and infection.  -Cardiac clearance requested prior to proceeding with an EGD, cardiac clearance to also  include Brilinta hold instructions  -Continue Pantoprazole 40 mg daily -Patient to avoid large pieces of meat and bread, cut food into small pieces and chew food thoroughly  2) Acute anterolateral STEMI 3/22 post-op paraesophageal hernia repair s/p DES to LAD and PTCA D1 on Brilinta and ASA '81mg'$  QD -Cardiac clearance as requested above  3) Chronic systolic CHF. Ischemic cardiomyopathy. LVEF 20 -25% in setting of MI 08/2020, repeat ECHO 11/2020 LVEF 55 - 60%.  4) Normocytic anemia. No Overt  GI bleeding.  Last hemoglobin 12.8 on 10/09/2020. -CBC  5) History of colon polyps.  Colonoscopy 08/2017  identified 1 tubular adenomatous polyp removed from the distal transverse colon. -Next colonoscopy due 08/2022  Further recommendations to be determined after the above evaluation completed  Today's encounter was 30  minutes which included precharting, chart/result review, history/exam, face-to-face time used for counseling, formulating treatment plan with follow-up and documentation.  ADDENDUM: Case reviewed by Osvaldo Angst 02/10/2022, patient with history of difficult ntubation therefore EGD to be scheduled at [facility]: Cardiac clearance per Diona Browner cardiology NP received 02/10/2022 as follows: Preoperative Cardiovascular Risk Assessment:   According to the Revised Cardiac Risk Index (RCRI), his Perioperative Risk of Major Cardiac Event is (%): 6.6.  His Functional Capacity in METs is: 8.97 according to the Duke Activity Status Index (DASI). Therefore, based on ACC/AHA guidelines, patient would be at acceptable risk for the planned procedure without further cardiovascular testing.   Per Dr. Aundra Dubin, primary cardiologist, patient may hold Brilinta for 5 days prior to procedure. Please resume Brilinta as soon as possible postprocedure, at the discretion of  the surgeon.

## 2022-02-04 ENCOUNTER — Ambulatory Visit (INDEPENDENT_AMBULATORY_CARE_PROVIDER_SITE_OTHER): Payer: BC Managed Care – PPO | Admitting: Nurse Practitioner

## 2022-02-04 ENCOUNTER — Telehealth: Payer: Self-pay | Admitting: *Deleted

## 2022-02-04 ENCOUNTER — Encounter: Payer: Self-pay | Admitting: Nurse Practitioner

## 2022-02-04 VITALS — BP 118/68 | HR 70 | Ht 67.0 in | Wt 163.1 lb

## 2022-02-04 DIAGNOSIS — K222 Esophageal obstruction: Secondary | ICD-10-CM

## 2022-02-04 DIAGNOSIS — R1310 Dysphagia, unspecified: Secondary | ICD-10-CM | POA: Diagnosis not present

## 2022-02-04 DIAGNOSIS — D649 Anemia, unspecified: Secondary | ICD-10-CM | POA: Diagnosis not present

## 2022-02-04 NOTE — Telephone Encounter (Signed)
Ok to hold ticagrelor for colonoscopy.

## 2022-02-04 NOTE — Telephone Encounter (Signed)
   Name: Jerry Kramer  DOB: 12/24/1955  MRN: [id number]  Primary Cardiologist: Dr. Aundra Dubin   Preoperative team, please contact this patient and set up a phone call appointment for further preoperative risk assessment. Please obtain consent and complete medication review. Thank you for your help.  Last OV 08/2021.  I confirm that guidance regarding antiplatelet and oral anticoagulation therapy has been completed and, if necessary, noted below.   Charlie Pitter, PA-C 02/04/2022, 4:29 PM [place]

## 2022-02-04 NOTE — Progress Notes (Signed)
Addendum: Reviewed and agree with assessment and management plan. Javanna Patin M, MD  

## 2022-02-04 NOTE — Telephone Encounter (Signed)
[facility] Pre-operative Risk Assessment     Request for surgical clearance:     Endoscopy Procedure  What type of surgery is being performed?     EGD  When is this surgery scheduled?     Thursday 02/18/22  What type of clearance is required ?   Pharmacy/Cardiac clearance  Are there any medications that need to be held prior to surgery and how long? Brilinta 5 days and cardiac clearance  Practice name and name of physician performing surgery?      [facility]  What is your office phone and fax number?      Phone- [phone]  [phone]  Anesthesia type (None, local, MAC, general) ?       MAC

## 2022-02-04 NOTE — Telephone Encounter (Signed)
   Patient Name: Jerry Kramer  DOB: [birth year] MRN: [id number]  Primary Cardiologist: Primary Cardiologist: Dr. Aundra Dubin with advanced HF team  Chart reviewed as part of pre-operative protocol coverage. Patient has h/o CAD with anterolateral STEMI 08/2020 with occluded LAD and D1, now s/p DES to LAD and PTCA to D1 with ICM with recovered EF. Now on ASA + maintenance dose Brilinta. Per periop DAPT protocol, given overlapping stents and length of PCI in LAD, need the OK from Dr. Aundra Dubin to stop Brilinta for colonoscopy. Dr. Aundra Dubin - Please route response to [facility] PREOP (the pre-op pool). Thank you.  After response preop team will plan virtual visit finalize clearance.  Charlie Pitter, PA-C 02/04/2022, 12:00 PM

## 2022-02-04 NOTE — Patient Instructions (Signed)
Continue Pantoprazole 40 mg daily.   You will be contacted by our office prior to your procedure for directions on holding your Brilinta.  If you do not hear from our office 1 week prior to your scheduled procedure, please call [phone] to discuss.   _______________________________________________________  If you are age 65 or older, your body mass index should be between 23-30. Your Body mass index is 25.55 kg/m. If this is out of the aforementioned range listed, please consider follow up with your Primary Care Provider.  If you are age 69 or younger, your body mass index should be between 19-25. Your Body mass index is 25.55 kg/m. If this is out of the aformentioned range listed, please consider follow up with your Primary Care Provider.   ________________________________________________________  The [facility] providers would like to encourage you to use [facility] to communicate with providers for non-urgent requests or questions.  Due to long hold times on the telephone, sending your provider a message by [facility] may be a faster and more efficient way to get a response.  Please allow 48 business hours for a response.  Please remember that this is for non-urgent requests.  _______________________________________________________

## 2022-02-05 ENCOUNTER — Telehealth: Payer: Self-pay | Admitting: *Deleted

## 2022-02-05 NOTE — Telephone Encounter (Signed)
Pt agreeable to plan of care for tele pre op appt 02/10/22 @ 10:20. Med rec and consent are done.     Patient Consent for Virtual Visit        Jerry Kramer has provided verbal consent on 02/05/2022 for a virtual visit (video or telephone).   CONSENT FOR VIRTUAL VISIT FOR:  Jerry Kramer  By participating in this virtual visit I agree to the following:  I hereby voluntarily request, consent and authorize [facility] and its employed or contracted physicians, physician assistants, nurse practitioners or other licensed health care professionals (the Practitioner), to provide me with telemedicine health care services (the "Services") as deemed necessary by the treating Practitioner. I acknowledge and consent to receive the Services by the Practitioner via telemedicine. I understand that the telemedicine visit will involve communicating with the Practitioner through live audiovisual communication technology and the disclosure of certain medical information by electronic transmission. I acknowledge that I have been given the opportunity to request an in-person assessment or other available alternative prior to the telemedicine visit and am voluntarily participating in the telemedicine visit.  I understand that I have the right to withhold or withdraw my consent to the use of telemedicine in the course of my care at any time, without affecting my right to future care or treatment, and that the Practitioner or I may terminate the telemedicine visit at any time. I understand that I have the right to inspect all information obtained and/or recorded in the course of the telemedicine visit and may receive copies of available information for a reasonable fee.  I understand that some of the potential risks of receiving the Services via telemedicine include:  Delay or interruption in medical evaluation due to technological equipment failure or disruption; Information transmitted may not be sufficient (e.g. poor  resolution of images) to allow for appropriate medical decision making by the Practitioner; and/or  In rare instances, security protocols could fail, causing a breach of personal health information.  Furthermore, I acknowledge that it is my responsibility to provide information about my medical history, conditions and care that is complete and accurate to the best of my ability. I acknowledge that Practitioner's advice, recommendations, and/or decision may be based on factors not within their control, such as incomplete or inaccurate data provided by me or distortions of diagnostic images or specimens that may result from electronic transmissions. I understand that the practice of medicine is not an exact science and that Practitioner makes no warranties or guarantees regarding treatment outcomes. I acknowledge that a copy of this consent can be made available to me via my patient portal ([place]), or I can request a printed copy by calling the office of [facility].    I understand that my insurance will be billed for this visit.   I have read or had this consent read to me. I understand the contents of this consent, which adequately explains the benefits and risks of the Services being provided via telemedicine.  I have been provided ample opportunity to ask questions regarding this consent and the Services and have had my questions answered to my satisfaction. I give my informed consent for the services to be provided through the use of telemedicine in my medical care

## 2022-02-05 NOTE — Telephone Encounter (Signed)
Pt agreeable to plan of care for tele pre op appt 02/10/22 @ 10:20. Med rec and consent are done.

## 2022-02-10 ENCOUNTER — Telehealth: Payer: Self-pay | Admitting: *Deleted

## 2022-02-10 ENCOUNTER — Other Ambulatory Visit (INDEPENDENT_AMBULATORY_CARE_PROVIDER_SITE_OTHER): Payer: BC Managed Care – PPO

## 2022-02-10 ENCOUNTER — Ambulatory Visit (INDEPENDENT_AMBULATORY_CARE_PROVIDER_SITE_OTHER): Payer: BC Managed Care – PPO | Admitting: Nurse Practitioner

## 2022-02-10 DIAGNOSIS — Z01810 Encounter for preprocedural cardiovascular examination: Secondary | ICD-10-CM

## 2022-02-10 DIAGNOSIS — D649 Anemia, unspecified: Secondary | ICD-10-CM

## 2022-02-10 LAB — CBC
HCT: 43.9 % (ref 39.0–52.0)
Hemoglobin: 14.9 g/dL (ref 13.0–17.0)
MCHC: 34 g/dL (ref 30.0–36.0)
MCV: 93.6 fl (ref 78.0–100.0)
Platelets: 159 10*3/uL (ref 150.0–400.0)
RBC: 4.69 Mil/uL (ref 4.22–5.81)
RDW: 13.2 % (ref 11.5–15.5)
WBC: 5.7 10*3/uL (ref 4.0–10.5)

## 2022-02-10 NOTE — Progress Notes (Signed)
Virtual Visit via Telephone Note   Because of Jerry Kramer's co-morbid illnesses, he is at least at moderate risk for complications without adequate follow up.  This format is felt to be most appropriate for this patient at this time.  The patient did not have access to video technology/had technical difficulties with video requiring transitioning to audio format only (telephone).  All issues noted in this document were discussed and addressed.  No physical exam could be performed with this format.  Please refer to the patient's chart for his consent to telehealth for [facility].  Evaluation Performed:  Preoperative cardiovascular risk assessment _____________   Date:  02/10/2022   Patient ID:  Jerry Kramer, DOB 06-Aug-1955, MRN [id number] Patient Location:  Home Provider location:   Office  Primary Care Provider:  Eulas Post, MD Primary Cardiologist:  None  Chief Complaint / Patient Profile   66 y.o. y/o male with a h/o CAD s/p DES-LAD, PTCA-D1, chronic systolic heart failure, hypertension, hyperlipidemia, and GERD who is pending endoscopy on 02/18/2022 with [facility] and presents today for telephonic preoperative cardiovascular risk assessment.  Past Medical History    Past Medical History:  Diagnosis Date   Allergy    CHF (congestive heart failure) ([place])    Colon polyps    Complication of anesthesia    hard to wake up   CONTACT DERMATITIS 09/03/2009   ELEVATED BLOOD PRESSURE 06/19/2010   HYPERLIPIDEMIA 08/27/2009   UNSPECIFIED OTALGIA 06/19/2010   Past Surgical History:  Procedure Laterality Date   APPENDECTOMY  1983   BIOPSY  08/06/2020   Procedure: BIOPSY;  Surgeon: Lavena Bullion, DO;  Location: [facility];  Service: Gastroenterology;;  esophageal manometry probe  placement   CORONARY/GRAFT ACUTE MI REVASCULARIZATION N/A 09/15/2020   Procedure: Coronary/Graft Acute MI Revascularization;  Surgeon: Leonie Man, MD;  Location: [place] [facility];  Service: Cardiovascular;  Laterality: N/A;   ESOPHAGEAL MANOMETRY N/A 08/06/2020   Procedure: ESOPHAGEAL MANOMETRY (EM);  Surgeon: Lavena Bullion, DO;  [facility];  Service: Gastroenterology;  Laterality: N/A;   ESOPHAGOGASTRODUODENOSCOPY N/A 09/15/2020   Procedure: ESOPHAGOGASTRODUODENOSCOPY (EGD);  Surgeon: Lajuana Matte, MD;  Location: [place];  Service: Thoracic;  Laterality: N/A;   ESOPHAGOGASTRODUODENOSCOPY (EGD) WITH PROPOFOL N/A 08/06/2020   Procedure: ESOPHAGOGASTRODUODENOSCOPY (EGD) WITH PROPOFOL;  Surgeon: Lavena Bullion, DO;  [facility];  Service: Gastroenterology;  Laterality: N/A;   ESOPHAGOGASTRODUODENOSCOPY (EGD) WITH PROPOFOL N/A 01/09/2022   Procedure: ESOPHAGOGASTRODUODENOSCOPY (EGD) WITH PROPOFOL;  Surgeon: Carol Ada, MD;  Location: [place];  Service: Gastroenterology;  Laterality: N/A;   EYE MUSCLE SURGERY Right    LEFT HEART CATH AND CORONARY ANGIOGRAPHY N/A 09/15/2020   Procedure: LEFT HEART CATH AND CORONARY ANGIOGRAPHY;  Surgeon: Leonie Man, MD;  Location: [place] [facility];  Service: Cardiovascular;  Laterality: N/A;   MIDDLE EAR SURGERY     ROTATOR CUFF REPAIR Left    TONSILLECTOMY  1965   XI ROBOTIC ASSISTED HIATAL HERNIA REPAIR N/A 09/15/2020   Procedure: XI ROBOTIC ASSISTED HIATAL HERNIA REPAIR WITH MESH;  Surgeon: Lajuana Matte, MD;  Location: [facility];  Service: Thoracic;  Laterality: N/A;    Allergies  Allergies  Allergen Reactions   Meperidine Hcl Nausea And Vomiting   Penicillins Other (See Comments)    REACTION: Childhood   Advil [Ibuprofen] Palpitations   Pseudoephedrine Hcl Palpitations    History of Present Illness    Jerry Kramer is a 66 y.o.  male who presents via audio/video conferencing for a telehealth visit today.  Pt was last seen in cardiology clinic on 09/21/2021 by Dr. Aundra Dubin.  At that time Jerry Kramer was doing well.  The patient is now pending procedure as outlined above. Since his  last visit, he has done well from a cardiac standpoint. He denies chest pain, palpitations, dyspnea, pnd, orthopnea, n, v, dizziness, syncope, edema, weight gain, or early satiety. All other systems reviewed and are otherwise negative except as noted above.   Home Medications    Prior to Admission medications   Medication Sig Start Date End Date Taking? Authorizing Provider  aspirin 81 MG chewable tablet Chew 1 tablet (81 mg total) by mouth daily. 10/09/20   Lyda Jester M, PA-C  atorvastatin (LIPITOR) 80 MG tablet TAKE ONE TABLET BY MOUTH DAILY 04/08/21   Larey Dresser, MD  carvedilol (COREG) 3.125 MG tablet TAKE 1 TABLET BY MOUTH TWICE A DAY WITH MEALS 11/30/21   Lyda Jester M, PA-C  ENTRESTO 24-26 MG TAKE 1 TABLET BY MOUTH TWICE A DAY 01/05/22   Larey Dresser, MD  FARXIGA 10 MG TABS tablet TAKE 1 TABLET BY MOUTH EVERY DAY 10/28/21   Larey Dresser, MD  pantoprazole (PROTONIX) 40 MG tablet Take 1 tablet (40 mg total) by mouth daily. 12/30/21   Pyrtle, Lajuan Lines, MD  sildenafil (VIAGRA) 100 MG tablet Take 1 tablet (100 mg total) by mouth daily as needed for erectile dysfunction. 10/08/21   Burchette, Alinda Sierras, MD  spironolactone (ALDACTONE) 25 MG tablet TAKE 1 TABLET BY MOUTH AT BEDTIME 10/26/21   Larey Dresser, MD  ticagrelor (BRILINTA) 60 MG TABS tablet Take 1 tablet (60 mg total) by mouth 2 (two) times daily. 09/21/21   Larey Dresser, MD    Physical Exam    Vital Signs:  Jerry Kramer does not have vital signs available for review today.  Given telephonic nature of communication, physical exam is limited. AAOx3. NAD. Normal affect.  Speech and respirations are unlabored.  Accessory Clinical Findings    None  Assessment & Plan    1.  Preoperative Cardiovascular Risk Assessment:  According to the Revised Cardiac Risk Index (RCRI), his Perioperative Risk of Major Cardiac Event is (%): 6.6.  His Functional Capacity in METs is: 8.97 according to the Duke Activity Status Index  (DASI). Therefore, based on ACC/AHA guidelines, patient would be at acceptable risk for the planned procedure without further cardiovascular testing.  Per Dr. Aundra Dubin, primary cardiologist, patient may hold Brilinta for 5 days prior to procedure. Please resume Brilinta as soon as possible postprocedure, at the discretion of the surgeon.  A copy of this note will be routed to requesting surgeon.  Time:   Today, I have spent 5 minutes with the patient with telehealth technology discussing medical history, symptoms, and management plan.     Lenna Sciara, NP  02/10/2022, 10:41 AM

## 2022-02-10 NOTE — Telephone Encounter (Signed)
Dr. Hilarie Fredrickson,  This pt is scheduled with you on 8/24.  He is a documented difficult intubation and his procedure will need to be done at the hospital.    Thanks,  Osvaldo Angst

## 2022-02-11 ENCOUNTER — Telehealth: Payer: Self-pay | Admitting: Nurse Practitioner

## 2022-02-11 NOTE — Telephone Encounter (Signed)
Left message for pt to call back  °

## 2022-02-11 NOTE — Telephone Encounter (Signed)
Jerry Kramer, patient has a history of difficult intubation therefore his EGD needs to be scheduled with Dr. Hilarie Fredrickson at [facility] as noted by Osvaldo Angst. Please cancel his EGD at [facility] on 02/18/2022 and schedule EGD at [facility].   Note cardiac clearance per  Diona Browner cardiology NP received 02/10/2022 as follows:  Preoperative Cardiovascular Risk Assessment:   According to the Revised Cardiac Risk Index (RCRI), his Perioperative Risk of Major Cardiac Event is (%): 6.6.  His Functional Capacity in METs is: 8.97 according to the Duke Activity Status Index (DASI). Therefore, based on ACC/AHA guidelines, patient would be at acceptable risk for the planned procedure without further cardiovascular testing.   Per Dr. Aundra Dubin, primary cardiologist, patient may hold Brilinta for 5 days prior to procedure. Please resume Brilinta as soon as possible postprocedure, at the discretion of the surgeon.

## 2022-02-12 ENCOUNTER — Other Ambulatory Visit: Payer: Self-pay

## 2022-02-12 DIAGNOSIS — K222 Esophageal obstruction: Secondary | ICD-10-CM

## 2022-02-12 DIAGNOSIS — R1310 Dysphagia, unspecified: Secondary | ICD-10-CM

## 2022-02-12 NOTE — Telephone Encounter (Signed)
See additional phone note. 

## 2022-02-12 NOTE — Telephone Encounter (Signed)
Order in for procedure, spoke with [facility] and appt has been scheduled. Pt knows to arrive at 11:30am, be NPO after midnight and to hold Brilinta for 5 days prior to procedure.

## 2022-02-12 NOTE — Telephone Encounter (Signed)
Vaughan Basta, I have spoken to [facility], Elna Breslow Please place this patient in the Depo for upper endoscopy with dilation next Thursday, 02/18/2022 at [facility] 1 PM Patient is already aware He is also aware to hold Brilinta 5 days and we have received clearance from cardiology is due to do so  Thanks

## 2022-02-12 NOTE — Telephone Encounter (Signed)
Inbound cal from patient returning call. Please give a call to further advise.  Thank you

## 2022-02-12 NOTE — Telephone Encounter (Signed)
Pt was made aware of Jerry Kramer recommendations: Pt made aware that his  EGD at [facility] on 02/18/2022 has be canceled. Pt was made aware that our office will contact pt and schedule him with Dr. Hilarie Fredrickson to have this done at [place] made aware that he does have cardiac clearance and that he  may hold Brilinta for 5 days prior to procedure:  Pt verbalized understanding with all questions answered.

## 2022-02-18 ENCOUNTER — Encounter: Payer: BC Managed Care – PPO | Admitting: Internal Medicine

## 2022-02-18 ENCOUNTER — Ambulatory Visit (HOSPITAL_COMMUNITY)
Admission: RE | Admit: 2022-02-18 | Discharge: 2022-02-18 | Disposition: A | Discharge status: Home / Self Care | Payer: BC Managed Care – PPO | Source: Ambulatory Visit | Attending: Internal Medicine | Admitting: Internal Medicine

## 2022-02-18 ENCOUNTER — Encounter (HOSPITAL_COMMUNITY): Payer: Self-pay | Admitting: Internal Medicine

## 2022-02-18 ENCOUNTER — Ambulatory Visit (HOSPITAL_COMMUNITY): Payer: BC Managed Care – PPO | Admitting: Anesthesiology

## 2022-02-18 ENCOUNTER — Encounter (HOSPITAL_COMMUNITY)
Admission: RE | Disposition: A | Discharge status: Home / Self Care | Payer: Self-pay | Source: Ambulatory Visit | Attending: Internal Medicine

## 2022-02-18 ENCOUNTER — Other Ambulatory Visit: Payer: Self-pay

## 2022-02-18 DIAGNOSIS — Q398 Other congenital malformations of esophagus: Secondary | ICD-10-CM | POA: Diagnosis not present

## 2022-02-18 DIAGNOSIS — Z9889 Other specified postprocedural states: Secondary | ICD-10-CM | POA: Diagnosis not present

## 2022-02-18 DIAGNOSIS — Q399 Congenital malformation of esophagus, unspecified: Secondary | ICD-10-CM

## 2022-02-18 DIAGNOSIS — I2510 Atherosclerotic heart disease of native coronary artery without angina pectoris: Secondary | ICD-10-CM | POA: Insufficient documentation

## 2022-02-18 DIAGNOSIS — K219 Gastro-esophageal reflux disease without esophagitis: Secondary | ICD-10-CM | POA: Diagnosis not present

## 2022-02-18 DIAGNOSIS — Z79899 Other long term (current) drug therapy: Secondary | ICD-10-CM | POA: Diagnosis not present

## 2022-02-18 DIAGNOSIS — R1310 Dysphagia, unspecified: Secondary | ICD-10-CM

## 2022-02-18 DIAGNOSIS — I252 Old myocardial infarction: Secondary | ICD-10-CM | POA: Diagnosis not present

## 2022-02-18 DIAGNOSIS — K222 Esophageal obstruction: Secondary | ICD-10-CM

## 2022-02-18 DIAGNOSIS — I110 Hypertensive heart disease with heart failure: Secondary | ICD-10-CM | POA: Diagnosis not present

## 2022-02-18 DIAGNOSIS — I509 Heart failure, unspecified: Secondary | ICD-10-CM | POA: Diagnosis not present

## 2022-02-18 HISTORY — PX: ESOPHAGOGASTRODUODENOSCOPY (EGD) WITH PROPOFOL: SHX5813

## 2022-02-18 HISTORY — PX: BALLOON DILATION: SHX5330

## 2022-02-18 SURGERY — ESOPHAGOGASTRODUODENOSCOPY (EGD) WITH PROPOFOL
Anesthesia: Monitor Anesthesia Care

## 2022-02-18 MED ORDER — SODIUM CHLORIDE 0.9 % IV SOLN: INTRAVENOUS | Status: DC

## 2022-02-18 MED ORDER — LIDOCAINE 2% (20 MG/ML) 5 ML SYRINGE
INTRAMUSCULAR | Status: DC | PRN
  Administered 2022-02-18: 80 mg via INTRAVENOUS

## 2022-02-18 MED ORDER — PROPOFOL 10 MG/ML IV BOLUS
INTRAVENOUS | Status: DC | PRN
  Administered 2022-02-18: 30 mg via INTRAVENOUS

## 2022-02-18 MED ORDER — PROPOFOL 500 MG/50ML IV EMUL
INTRAVENOUS | Status: DC | PRN
  Administered 2022-02-18: 125 ug/kg/min via INTRAVENOUS

## 2022-02-18 SURGICAL SUPPLY — 15 items

## 2022-02-18 NOTE — H&P (Signed)
GASTROENTEROLOGY PROCEDURE H&P NOTE   Primary Care Physician: Eulas Post, MD    Reason for Procedure:   Esophageal dysphagia  Plan:    EGD with dilation  Patient is appropriate for endoscopic procedure(s) in the ambulatory ([place]) setting.  The nature of the procedure, as well as the risks, benefits, and alternatives were carefully and thoroughly reviewed with the patient. Ample time for discussion and questions allowed. The patient understood, was satisfied, and agreed to proceed.     HPI: Jerry Kramer is a 66 y.o. male who presents for EGD.  Medical history as below.  No recent chest pain or shortness of breath.  No abdominal pain today.  Past Medical History:  Diagnosis Date   Allergy    CHF (congestive heart failure) ([place])    Colon polyps    Complication of anesthesia    hard to wake up   CONTACT DERMATITIS 09/03/2009   ELEVATED BLOOD PRESSURE 06/19/2010   HYPERLIPIDEMIA 08/27/2009   UNSPECIFIED OTALGIA 06/19/2010    Past Surgical History:  Procedure Laterality Date   APPENDECTOMY  1983   BIOPSY  08/06/2020   Procedure: BIOPSY;  Surgeon: Lavena Bullion, DO;  [facility];  Service: Gastroenterology;;  esophageal manometry probe  placement   CORONARY/GRAFT ACUTE MI REVASCULARIZATION N/A 09/15/2020   Procedure: Coronary/Graft Acute MI Revascularization;  Surgeon: Leonie Man, MD;  Location: [place] [facility];  Service: Cardiovascular;  Laterality: N/A;   ESOPHAGEAL MANOMETRY N/A 08/06/2020   Procedure: ESOPHAGEAL MANOMETRY (EM);  Surgeon: Lavena Bullion, DO;  [facility];  Service: Gastroenterology;  Laterality: N/A;   ESOPHAGOGASTRODUODENOSCOPY N/A 09/15/2020   Procedure: ESOPHAGOGASTRODUODENOSCOPY (EGD);  Surgeon: Lajuana Matte, MD;  Location: [place];  Service: Thoracic;  Laterality: N/A;   ESOPHAGOGASTRODUODENOSCOPY (EGD) WITH PROPOFOL N/A 08/06/2020   Procedure: ESOPHAGOGASTRODUODENOSCOPY (EGD) WITH PROPOFOL;  Surgeon:  Lavena Bullion, DO;  [facility];  Service: Gastroenterology;  Laterality: N/A;   ESOPHAGOGASTRODUODENOSCOPY (EGD) WITH PROPOFOL N/A 01/09/2022   Procedure: ESOPHAGOGASTRODUODENOSCOPY (EGD) WITH PROPOFOL;  Surgeon: Carol Ada, MD;  Location: [place];  Service: Gastroenterology;  Laterality: N/A;   EYE MUSCLE SURGERY Right    LEFT HEART CATH AND CORONARY ANGIOGRAPHY N/A 09/15/2020   Procedure: LEFT HEART CATH AND CORONARY ANGIOGRAPHY;  Surgeon: Leonie Man, MD;  Location: [place] [facility];  Service: Cardiovascular;  Laterality: N/A;   MIDDLE EAR SURGERY     ROTATOR CUFF REPAIR Left    TONSILLECTOMY  1965   XI ROBOTIC ASSISTED HIATAL HERNIA REPAIR N/A 09/15/2020   Procedure: XI ROBOTIC ASSISTED HIATAL HERNIA REPAIR WITH MESH;  Surgeon: Lajuana Matte, MD;  Location: [place];  Service: Thoracic;  Laterality: N/A;    Prior to Admission medications   Medication Sig Start Date End Date Taking? Authorizing Provider  aspirin 81 MG chewable tablet Chew 1 tablet (81 mg total) by mouth daily. 10/09/20  Yes Simmons, Brittainy M, PA-C  atorvastatin (LIPITOR) 80 MG tablet TAKE ONE TABLET BY MOUTH DAILY 04/08/21  Yes Larey Dresser, MD  carvedilol (COREG) 3.125 MG tablet TAKE 1 TABLET BY MOUTH TWICE A DAY WITH MEALS 11/30/21  Yes Lyda Jester M, PA-C  ENTRESTO 24-26 MG TAKE 1 TABLET BY MOUTH TWICE A DAY 01/05/22  Yes Larey Dresser, MD  FARXIGA 10 MG TABS tablet TAKE 1 TABLET BY MOUTH EVERY DAY 10/28/21  Yes Larey Dresser, MD  pantoprazole (PROTONIX) 40 MG tablet Take 1 tablet (40 mg total) by  mouth daily. 12/30/21  Yes Kimbly Eanes, Lajuan Lines, MD  sildenafil (VIAGRA) 100 MG tablet Take 1 tablet (100 mg total) by mouth daily as needed for erectile dysfunction. 10/08/21  Yes Burchette, Alinda Sierras, MD  spironolactone (ALDACTONE) 25 MG tablet TAKE 1 TABLET BY MOUTH AT BEDTIME 10/26/21  Yes Larey Dresser, MD  ticagrelor (BRILINTA) 60 MG TABS tablet Take 1 tablet (60 mg total) by mouth 2  (two) times daily. 09/21/21   Larey Dresser, MD    Current Facility-Administered Medications  Medication Dose Route Frequency Provider Last Rate Last Admin   0.9 %  sodium chloride infusion   Intravenous Continuous Tayt Moyers, Lajuan Lines, MD 20 mL/hr at 02/18/22 1247 Continued from Pre-op at 02/18/22 1247    Allergies as of 02/12/2022 - Review Complete 02/10/2022  Allergen Reaction Noted   Meperidine hcl Nausea And Vomiting 08/27/2009   Penicillins Other (See Comments) 08/27/2009   Advil [ibuprofen] Palpitations 04/23/2013   Pseudoephedrine hcl Palpitations 08/04/2020    Family History  Problem Relation Age of Onset   Heart disease Mother 87       CAD   Cancer Mother        lung   Hyperlipidemia Father    Stroke Father    Colon polyps Father    Diabetes Sister        type II   Heart attack Brother 88   Heart disease Brother 35       MI   Colon cancer Neg Hx    Esophageal cancer Neg Hx    Pancreatic cancer Neg Hx    Stomach cancer Neg Hx    Rectal cancer Neg Hx     Social History   Socioeconomic History   Marital status: Married    Spouse name: Not on file   Number of children: Not on file   Years of education: Not on file   Highest education level: Not on file  Occupational History   Not on file  Tobacco Use   Smoking status: Never   Smokeless tobacco: Never  Vaping Use   Vaping Use: Never used  Substance and Sexual Activity   Alcohol use: No   Drug use: No   Sexual activity: Not on file  Other Topics Concern   Not on file  Social History Narrative   Occupation: Secondary school teacher   Married   Never Smoked   Alcohol use- no   Social Determinants of Radio broadcast assistant Strain: Not on file  Food Insecurity: Not on file  Transportation Needs: Not on file  Physical Activity: Not on file  Stress: Not on file  Social Connections: Not on file  Intimate Partner Violence: Not on file    Physical Exam: Vital signs in last 24 hours: '@BP'$  115/83    Pulse (!) 52   Resp 16   Ht '5\' 7"'$  (1.702 m)   Wt 73 kg   SpO2 100%   BMI 25.22 kg/m  GEN: NAD EYE: Sclerae anicteric ENT: MMM CV: Non-tachycardic Pulm: CTA b/l GI: Soft, NT/ND NEURO:  Alert & Oriented x 3   Zenovia Jarred, MD [facility]  02/18/2022 1:25 PM

## 2022-02-18 NOTE — Anesthesia Postprocedure Evaluation (Signed)
Anesthesia Post Note  Patient: Jerry Kramer  Procedure(s) Performed: ESOPHAGOGASTRODUODENOSCOPY (EGD) WITH PROPOFOL BALLOON DILATION     Patient location during evaluation: PACU Anesthesia Type: MAC Level of consciousness: awake and alert Pain management: pain level controlled Vital Signs Assessment: post-procedure vital signs reviewed and stable Respiratory status: spontaneous breathing, nonlabored ventilation and respiratory function stable Cardiovascular status: blood pressure returned to baseline Postop Assessment: no apparent nausea or vomiting Anesthetic complications: no   No notable events documented.  Last Vitals:  Vitals:   02/18/22 1410 02/18/22 1417  BP: 101/72 112/80  Pulse: (!) 50 (!) 50  Resp: 15 14  Temp:    SpO2: 96% 96%    Last Pain:  Vitals:   02/18/22 1417  TempSrc:   PainSc: 0-No pain                 Marthenia Rolling

## 2022-02-18 NOTE — Anesthesia Preprocedure Evaluation (Addendum)
Anesthesia Evaluation  Patient identified by MRN, date of birth, ID band Patient awake    Reviewed: Allergy & Precautions, NPO status , Patient's Chart, lab work & pertinent test results, reviewed documented beta blocker date and time   History of Anesthesia Complications Negative for: history of anesthetic complications  Airway Mallampati: III  TM Distance: >3 FB Neck ROM: Full    Dental no notable dental hx.    Pulmonary neg pulmonary ROS,    Pulmonary exam normal        Cardiovascular Pt. on home beta blockers + CAD, + Past MI and +CHF  Normal cardiovascular exam  TTE 11/26/20: EF 55-60%, mild hypokinesis of mid-to-apical anterior LV segments, grade I DD, mild dilatation of the aortic root measuring 41 mm.    Neuro/Psych negative neurological ROS  negative psych ROS   GI/Hepatic Neg liver ROS, hiatal hernia, GERD  Medicated and Controlled,Dysphagia   Endo/Other  negative endocrine ROS  Renal/GU negative Renal ROS  negative genitourinary   Musculoskeletal negative musculoskeletal ROS (+)   Abdominal   Peds  Hematology negative hematology ROS (+)   Anesthesia Other Findings Day of surgery medications reviewed with patient.  Reproductive/Obstetrics negative OB ROS                            Anesthesia Physical Anesthesia Plan  ASA: 3  Anesthesia Plan: MAC   Post-op Pain Management: Minimal or no pain anticipated   Induction:   PONV Risk Score and Plan: Treatment may vary due to age or medical condition and Propofol infusion  Airway Management Planned: Natural Airway and Nasal Cannula  Additional Equipment: None  Intra-op Plan:   Post-operative Plan:   Informed Consent: I have reviewed the patients History and Physical, chart, labs and discussed the procedure including the risks, benefits and alternatives for the proposed anesthesia with the patient or authorized representative  who has indicated his/her understanding and acceptance.       Plan Discussed with: CRNA  Anesthesia Plan Comments:        Anesthesia Quick Evaluation

## 2022-02-18 NOTE — Transfer of Care (Signed)
Immediate Anesthesia Transfer of Care Note  Patient: Jerry Kramer  Procedure(s) Performed: ESOPHAGOGASTRODUODENOSCOPY (EGD) WITH PROPOFOL BALLOON DILATION  Patient Location: Endoscopy Unit  Anesthesia Type:MAC  Level of Consciousness: drowsy and patient cooperative  Airway & Oxygen Therapy: Patient Spontanous Breathing and Patient connected to nasal cannula oxygen  Post-op Assessment: Report given to RN and Post -op Vital signs reviewed and stable  Post vital signs: Reviewed and stable  Last Vitals:  Vitals Value Taken Time  BP 95/61 02/18/22 1352  Temp    Pulse 52 02/18/22 1354  Resp 14 02/18/22 1354  SpO2 97 % 02/18/22 1354  Vitals shown include unvalidated device data.  Last Pain:  Vitals:   02/18/22 1148  PainSc: 0-No pain         Complications: No notable events documented.

## 2022-02-18 NOTE — Op Note (Signed)
[facility] Patient Name: Jerry Kramer Procedure Date : 02/18/2022 MRN: [id number] Attending MD: Jerene Bears , MD Date of Birth: 02-08-1956 CSN: [id number] Age: 66 Admit Type: Outpatient Procedure:                Upper GI endoscopy Indications:              Dysphagia, patient with hx of esophageal                            dysmotility, prior paraesophageal hernia s/p                            robotic assisted repair with mesh placement and dor                            fundoplication in March 9024, recent food impaction                            with EGD in July 2023 Providers:                Lajuan Lines. Hilarie Fredrickson, MD, Burtis Junes, RN, Cherylynn Ridges,                            Technician, Larene Beach, CRNA Referring MD:             Eulas Post Medicines:                Monitored Anesthesia Care Complications:            No immediate complications. Estimated Blood Loss:     Estimated blood loss: none. Procedure:                Pre-Anesthesia Assessment:                           - Prior to the procedure, a History and Physical                            was performed, and patient medications and                            allergies were reviewed. The patient's tolerance of                            previous anesthesia was also reviewed. The risks                            and benefits of the procedure and the sedation                            options and risks were discussed with the patient.                            All questions were answered, and informed consent  was obtained. Prior Anticoagulants: The patient has                            taken Brilinta last dose was 5 days prior to                            procedure. ASA Grade Pradaxa (dabigatran), last                            dose was 5 days prior to procedure. ASA Grade                            Assessment: III - A patient with severe systemic                             disease. After reviewing the risks and benefits,                            the patient was deemed in satisfactory condition to                            undergo the procedure.                           After obtaining informed consent, the endoscope was                            passed under direct vision. Throughout the                            procedure, the patient's blood pressure, pulse, and                            oxygen saturations were monitored continuously. The                            [id number]-H190 ([id number]) Olympus endoscope was introduced                            through the mouth, and advanced to the second part                            of duodenum. The upper GI endoscopy was                            accomplished without difficulty. The patient                            tolerated the procedure well. Scope In: Scope Out: Findings:      The lower third of the esophagus was moderately tortuous.      The Z-line was regular and was found 38 cm from the incisors. There was       no definitive stricture, though there was mild resistance encountered at  the GE junction. A TTS dilator was passed through the scope. Dilation       with a 15-16.5-18 mm balloon dilator was performed to 18 mm (after       inspection at 15 and 16.5 mm). The lower esophagus and GEJ appeared more       patent post-dilation but without significant mucosal disruption.      The cardia and gastric fundus were normal on retroflexion. No residual       or recurrent hiatal hernia.      The entire examined stomach was normal.      The examined duodenum was normal. Impression:               - Tortuous esophagus.                           - Z-line regular, 38 cm from the incisors.                            Increased resistance at LES. Dilated to 18 mm (as                            above).                           - Normal stomach.                           - Normal examined duodenum.                            - No specimens collected. Moderate Sedation:      N/A Recommendation:           - Patient has a contact number available for                            emergencies. The signs and symptoms of potential                            delayed complications were discussed with the                            patient. Return to normal activities tomorrow.                            Written discharge instructions were provided to the                            patient.                           - Resume previous diet.                           - Continue present medications including                            pantoprazole 40 mg once daily.                           -  Okay to resume Brilinta today.                           - Repeat upper endoscopy PRN for retreatment. Procedure Code(s):        --- Professional ---                           [phone], Esophagogastroduodenoscopy, flexible,                            transoral; with transendoscopic balloon dilation of                            esophagus (less than 30 mm diameter) Diagnosis Code(s):        --- Professional ---                           Q39.9, Congenital malformation of esophagus,                            unspecified                           R13.10, Dysphagia, unspecified CPT copyright 2019 American Medical [employer]. All rights reserved. The codes documented in this report are preliminary and upon coder review may  be revised to meet current compliance requirements. Jerene Bears, MD 02/18/2022 1:58:26 PM This report has been signed electronically. Number of Addenda: 0

## 2022-02-18 NOTE — Discharge Instructions (Signed)
YOU HAD AN ENDOSCOPIC PROCEDURE TODAY: Refer to the procedure report and other information in the discharge instructions given to you for any specific questions about what was found during the examination. If this information does not answer your questions, please call Campus office at [phone] to clarify.  ° °YOU SHOULD EXPECT: Some feelings of bloating in the abdomen. Passage of more gas than usual. Walking can help get rid of the air that was put into your GI tract during the procedure and reduce the bloating. If you had a lower endoscopy (such as a colonoscopy or flexible sigmoidoscopy) you may notice spotting of blood in your stool or on the toilet paper. Some abdominal soreness may be present for a day or two, also. ° °DIET: Your first meal following the procedure should be a light meal and then it is ok to progress to your normal diet. A half-sandwich or bowl of soup is an example of a good first meal. Heavy or fried foods are harder to digest and may make you feel nauseous or bloated. Drink plenty of fluids but you should avoid alcoholic beverages for 24 hours. If you had a esophageal dilation, please see attached instructions for diet.   ° °ACTIVITY: Your care partner should take you home directly after the procedure. You should plan to take it easy, moving slowly for the rest of the day. You can resume normal activity the day after the procedure however YOU SHOULD NOT DRIVE, use power tools, machinery or perform tasks that involve climbing or major physical exertion for 24 hours (because of the sedation medicines used during the test).  ° °SYMPTOMS TO REPORT IMMEDIATELY: °A gastroenterologist can be reached at any hour. Please call [phone]  for any of the following symptoms:  °Following lower endoscopy (colonoscopy, flexible sigmoidoscopy) °Excessive amounts of blood in the stool  °Significant tenderness, worsening of abdominal pains  °Swelling of the abdomen that is new, acute  °Fever of 100° or  higher  °Following upper endoscopy (EGD, EUS, ERCP, esophageal dilation) °Vomiting of blood or coffee ground material  °New, significant abdominal pain  °New, significant chest pain or pain under the shoulder blades  °Painful or persistently difficult swallowing  °New shortness of breath  °Black, tarry-looking or red, bloody stools ° °FOLLOW UP:  °If any biopsies were taken you will be contacted by phone or by letter within the next 1-3 weeks. Call [phone]  if you have not heard about the biopsies in 3 weeks.  °Please also call with any specific questions about appointments or follow up tests. ° °

## 2022-02-19 ENCOUNTER — Encounter (HOSPITAL_COMMUNITY): Payer: Self-pay | Admitting: Internal Medicine

## 2022-02-28 ENCOUNTER — Other Ambulatory Visit (HOSPITAL_COMMUNITY): Payer: Self-pay | Admitting: Cardiology

## 2022-03-15 ENCOUNTER — Other Ambulatory Visit (HOSPITAL_COMMUNITY): Payer: Self-pay

## 2022-03-15 MED ORDER — ATORVASTATIN CALCIUM 80 MG PO TABS: 80.0000 mg | ORAL_TABLET | Freq: Every day | ORAL | 3 refills | Status: DC

## 2022-03-22 ENCOUNTER — Ambulatory Visit (HOSPITAL_COMMUNITY)
Admission: RE | Admit: 2022-03-22 | Discharge: 2022-03-22 | Disposition: A | Discharge status: Home / Self Care | Payer: BC Managed Care – PPO | Source: Ambulatory Visit | Attending: Cardiology | Admitting: Cardiology

## 2022-03-22 ENCOUNTER — Encounter (HOSPITAL_COMMUNITY): Payer: Self-pay | Admitting: Cardiology

## 2022-03-22 ENCOUNTER — Ambulatory Visit (HOSPITAL_BASED_OUTPATIENT_CLINIC_OR_DEPARTMENT_OTHER)
Admission: RE | Admit: 2022-03-22 | Discharge: 2022-03-22 | Disposition: A | Discharge status: Home / Self Care | Payer: BC Managed Care – PPO | Source: Ambulatory Visit

## 2022-03-22 VITALS — BP 98/64 | HR 57 | Wt 167.0 lb

## 2022-03-22 DIAGNOSIS — E785 Hyperlipidemia, unspecified: Secondary | ICD-10-CM | POA: Diagnosis not present

## 2022-03-22 DIAGNOSIS — K219 Gastro-esophageal reflux disease without esophagitis: Secondary | ICD-10-CM | POA: Diagnosis not present

## 2022-03-22 DIAGNOSIS — Z7982 Long term (current) use of aspirin: Secondary | ICD-10-CM | POA: Insufficient documentation

## 2022-03-22 DIAGNOSIS — I110 Hypertensive heart disease with heart failure: Secondary | ICD-10-CM | POA: Insufficient documentation

## 2022-03-22 DIAGNOSIS — Z79899 Other long term (current) drug therapy: Secondary | ICD-10-CM | POA: Insufficient documentation

## 2022-03-22 DIAGNOSIS — I2510 Atherosclerotic heart disease of native coronary artery without angina pectoris: Secondary | ICD-10-CM | POA: Insufficient documentation

## 2022-03-22 DIAGNOSIS — Z7902 Long term (current) use of antithrombotics/antiplatelets: Secondary | ICD-10-CM | POA: Insufficient documentation

## 2022-03-22 DIAGNOSIS — I5022 Chronic systolic (congestive) heart failure: Secondary | ICD-10-CM

## 2022-03-22 DIAGNOSIS — I493 Ventricular premature depolarization: Secondary | ICD-10-CM | POA: Insufficient documentation

## 2022-03-22 DIAGNOSIS — Z955 Presence of coronary angioplasty implant and graft: Secondary | ICD-10-CM | POA: Diagnosis not present

## 2022-03-22 DIAGNOSIS — Z7984 Long term (current) use of oral hypoglycemic drugs: Secondary | ICD-10-CM | POA: Diagnosis not present

## 2022-03-22 DIAGNOSIS — I252 Old myocardial infarction: Secondary | ICD-10-CM | POA: Insufficient documentation

## 2022-03-22 DIAGNOSIS — I255 Ischemic cardiomyopathy: Secondary | ICD-10-CM | POA: Insufficient documentation

## 2022-03-22 LAB — BASIC METABOLIC PANEL
Anion gap: 7 (ref 5–15)
BUN: 14 mg/dL (ref 8–23)
CO2: 25 mmol/L (ref 22–32)
Calcium: 9.3 mg/dL (ref 8.9–10.3)
Chloride: 110 mmol/L (ref 98–111)
Creatinine, Ser: 0.89 mg/dL (ref 0.61–1.24)
GFR, Estimated: >60 mL/min (ref >60)
Glucose, Bld: 98 mg/dL (ref 70–99)
Potassium: 3.7 mmol/L (ref 3.5–5.1)
Sodium: 142 mmol/L (ref 135–145)

## 2022-03-22 LAB — ECHOCARDIOGRAM COMPLETE
AR max vel: 2.56 cm2
AV Peak grad: 7.7 mmHg
Ao pk vel: 1.39 m/s
Area-P 1/2: 3.91 cm2
S' Lateral: 3.5 cm
Single Plane A4C EF: 56.8 %

## 2022-03-22 LAB — HEMOGLOBIN A1C
Hgb A1c MFr Bld: 5.5 % (ref 4.8–5.6)
Mean Plasma Glucose: 111.15 mg/dL

## 2022-03-22 NOTE — Patient Instructions (Signed)
There has been no changes to your medications.  Labs done today, your results will be available in MyChart, we will contact you for abnormal readings.  Your physician recommends that you schedule a follow-up appointment in: 6 months ( March 2024) ** please call the office in January to arrange your follow up appointment **  If you have any questions or concerns before your next appointment please send us a message through mychart or call our office at [phone].    TO LEAVE A MESSAGE FOR THE NURSE SELECT OPTION 2, PLEASE LEAVE A MESSAGE INCLUDING: YOUR NAME DATE OF BIRTH CALL BACK NUMBER REASON FOR CALL**this is important as we prioritize the call backs  YOU WILL RECEIVE A CALL BACK THE SAME DAY AS LONG AS YOU CALL BEFORE 4:00 PM  At the [facility], you and your health needs are our priority. As part of our continuing mission to provide you with exceptional heart care, we have created designated Provider Care Teams. These Care Teams include your primary Cardiologist (physician) and Advanced Practice Providers (APPs- Physician Assistants and Nurse Practitioners) who all work together to provide you with the care you need, when you need it.   You may see any of the following providers on your designated Care Team at your next follow up: Dr Daniel Bensimhon Dr Dalton McLean Dr. Aditya Sabharwal Amy Clegg, NP Brittainy Simmons, PA Jessica Milford,NP Lindsay Finch, PA Alma Diaz, NP Lauren Kemp, PharmD   Please be sure to bring in all your medications bottles to every appointment.    

## 2022-03-22 NOTE — Progress Notes (Signed)
[facility] Note    PCP: Eulas Post, MD Cardiologist: Dr. Aundra Dubin   HPI: 66 y.o. with history of HTN, hyperlipidemia, GERD/hiatal hernia.  He is a nonsmoker, mother and 2 brothers had MIs in 20s. Now w/ systolic heart failure and CAD. His daughter, [name], works in [employer].  He had been dealing with peptic stricture and hiatal hernia for a while now; he saw Dr. Kipp Brood and underwent robotic assisted laparoscopic paraesophageal hernia repair with fundoplication on 6/38/46.  Post-op, he was noted to have PVCs then developed chest pain with anterolateral ST elevation.  He was taken emergently for cath.  He was found to have acute occlusion of the proximal LAD.  This was treated with DES, and occluded D1 was treated with PTCA. LVEDP was around 26 mmHg.  After procedure, patient developed respiratory distress w/ lactic acidosis and AKI. Intubated and started on IV Lasix. Central line placed for co-ox and CVP monitoring. He was started on milrinone for low co-ox. Amiodarone started for PVC suppression. He responded well to therapy. Diuresed, extubated and able to wean off milrionone. PVCs well suppressed w/ amiodarone. GDMT initiated + DAPT w/ ASA + Brilinta for LAD stent. Amiodarone was discontinued and low dose ? blocker added. Discharged home w/ [facility].  Echo this admission showed EF 25-30%.   Repeat echo in 6/22 showed EF up to 55-60% with mild mid-apical anterior hypokinesis, normal RV.  Echo was done today and reviewed, EF 65-99%, grade 2 diastolic dysfunction, apical hypokinesis.   He returns for followup of CHF and CAD.  He is doing well, plays racketball once a week for 1.5 hrs.  No exertional dyspnea or chest pain.  Rare lightheadedness if he stands up too fast.  No orthopnea/PND.  No palpitations though PVCs were noted on his echo today.    ECG (personally reviewed): NSR, old ASMI  Labs (4/22): K 4.1, creatinine 1.09 Labs (6/22): K 3.6, creatinine 0.87, LDL 40, TGs  69 Labs (12/22): K 3.9, creatinine 0.95 Labs (3/23): LDL 39, HDL 39, K 3.6, creatinine 0.82  PMH: 1. Hyperlipidemia 2. GERD with hiatal hernia and peptic stricture.  S/p robotic assisted laparoscopic paraesophageal hernia repair with fundoplication on 3/57/01.  3. CAD: Acute anterolateral STEMI 3/22 post-op paraesophageal hernia repair. 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  DES to LAD and PTCA D1.  4. Chronic systolic CHF: Ischemic cardiomyopathy.  - Echo (3/22): EF 25-30%, LAD territory WMAs, normal RV, trivial MR.  - Echo (6/22): 55-60% with mild mid-apical anterior hypokinesis, normal RV. - Echo (9/23): EF 77-93%, grade 2 diastolic dysfunction, apical hypokinesis. 5. HTN   Current Outpatient Medications  Medication Sig Dispense Refill   aspirin 81 MG chewable tablet Chew 1 tablet (81 mg total) by mouth daily. 30 tablet 6   atorvastatin (LIPITOR) 80 MG tablet Take 1 tablet (80 mg total) by mouth daily. 90 tablet 3   carvedilol (COREG) 3.125 MG tablet TAKE 1 TABLET BY MOUTH TWICE A DAY WITH FOOD 180 tablet 1   ENTRESTO 24-26 MG TAKE 1 TABLET BY MOUTH TWICE A DAY 180 tablet 1   FARXIGA 10 MG TABS tablet TAKE 1 TABLET BY MOUTH EVERY DAY 90 tablet 1   pantoprazole (PROTONIX) 40 MG tablet Take 1 tablet (40 mg total) by mouth daily. 90 tablet 0   sildenafil (VIAGRA) 100 MG tablet Take 1 tablet (100 mg total) by mouth daily as needed for erectile  dysfunction. 10 tablet 0   spironolactone (ALDACTONE) 25 MG tablet TAKE 1 TABLET BY MOUTH AT BEDTIME 30 tablet 6   ticagrelor (BRILINTA) 60 MG TABS tablet Take 1 tablet (60 mg total) by mouth 2 (two) times daily. 60 tablet 11   No current facility-administered medications for this encounter.    Allergies  Allergen Reactions   Meperidine Hcl Nausea And Vomiting   Penicillins Other (See Comments)    REACTION: Childhood   Advil [Ibuprofen] Palpitations    Pseudoephedrine Hcl Palpitations      Social History   Socioeconomic History   Marital status: Married    Spouse name: Not on file   Number of children: Not on file   Years of education: Not on file   Highest education level: Not on file  Occupational History   Not on file  Tobacco Use   Smoking status: Never   Smokeless tobacco: Never  Vaping Use   Vaping Use: Never used  Substance and Sexual Activity   Alcohol use: No   Drug use: No   Sexual activity: Not on file  Other Topics Concern   Not on file  Social History Narrative   Occupation: Secondary school teacher   Married   Never Smoked   Alcohol use- no   Social Determinants of Radio broadcast assistant Strain: Not on file  Food Insecurity: Not on file  Transportation Needs: Not on file  Physical Activity: Not on file  Stress: Not on file  Social Connections: Not on file  Intimate Partner Violence: Not on file      Family History  Problem Relation Age of Onset   Heart disease Mother 20       CAD   Cancer Mother        lung   Hyperlipidemia Father    Stroke Father    Colon polyps Father    Diabetes Sister        type II   Heart attack Brother 61   Heart disease Brother 71       MI   Colon cancer Neg Hx    Esophageal cancer Neg Hx    Pancreatic cancer Neg Hx    Stomach cancer Neg Hx    Rectal cancer Neg Hx     Vitals:   03/22/22 0853  BP: 98/64  Pulse: (!) 57  SpO2: 96%  Weight: 75.8 kg (167 lb)    PHYSICAL EXAM: General: NAD Neck: No JVD, no thyromegaly or thyroid nodule.  Lungs: Clear to auscultation bilaterally with normal respiratory effort. CV: Nondisplaced PMI.  Heart regular S1/S2, no S3/S4, no murmur.  No peripheral edema.  No carotid bruit.  Normal pedal pulses.  Abdomen: Soft, nontender, no hepatosplenomegaly, no distention.  Skin: Intact without lesions or rashes.  Neurologic: Alert and oriented x 3.  Psych: Normal affect. Extremities: No clubbing or cyanosis.  HEENT: Normal.    ASSESSMENT & PLAN: 1.  CAD: Post-op anterolateral STEMI 09/15/20.  No prior CAD but history of HTN and hyperlipidemia and strong FH of CAD.  He had occluded proximal LAD and D1, now s/p DES to LAD and PTCA to D1.  No chest pain.  - Continue ASA 81 daily and ticagrelor 60 mg bid. .  - Continue atorvastatin 80 mg nightly. Good lipids in 3/23, I will sent hs-CRP and Lp(a) today to assess for residual risk.   2. Systolic CHF: Ischemic cardiomyopathy. Echo in 3/22 with EF 25-30%, LAD territory WMAs with no evidence for  mechanical MI complications.  Repeat echo in 6/22 with EF up to 55-60% with mid-apical anterior hypokinesis. Echo today showed EF 57-01%, grade 2 diastolic dysfunction, apical hypokinesis. NYHA class I, not volume overloaded on exam.  - Continue Entresto 24/26 bid.  BMET today.  - Continue spironolactone 25 mg  - Continue dapagliflozin 10 mg daily.   - Continue Coreg 3.125 mg bid.  3. PVCs: Not symptomatic.  - Continue Coreg 3.125 mg bid.   Followup in 6 months.   Loralie Champagne, MD 03/22/22

## 2022-03-24 ENCOUNTER — Encounter: Payer: Self-pay | Admitting: Family Medicine

## 2022-03-24 MED ORDER — SILDENAFIL CITRATE 100 MG PO TABS: 100.0000 mg | ORAL_TABLET | Freq: Every day | ORAL | 0 refills | Status: DC | PRN

## 2022-03-26 LAB — HIGH SENSITIVITY CRP: CRP, High Sensitivity: 0.7 mg/L (ref 0.00–3.00)

## 2022-03-26 LAB — LIPOPROTEIN A (LPA): Lipoprotein (a): 19.1 nmol/L (ref <75.0)

## 2022-03-27 ENCOUNTER — Other Ambulatory Visit: Payer: Self-pay | Admitting: Internal Medicine

## 2022-04-09 DIAGNOSIS — Z23 Encounter for immunization: Secondary | ICD-10-CM | POA: Diagnosis not present

## 2022-04-27 ENCOUNTER — Other Ambulatory Visit (HOSPITAL_COMMUNITY): Payer: Self-pay | Admitting: Cardiology

## 2022-04-27 MED ORDER — DAPAGLIFLOZIN PROPANEDIOL 10 MG PO TABS: 10.0000 mg | ORAL_TABLET | Freq: Every day | ORAL | 3 refills | Status: DC

## 2022-05-19 IMAGING — CT CT CHEST W/O CM
2 of 4 series · 15 of 36 positions shown, 18 images · non-contrast
Comparison: Is is is none aside from stone study of the abdomen and
pelvis from 8728.

CLINICAL DATA: Large hiatal hernia, evaluate hiatal hernia.

EXAM:
CT CHEST WITHOUT CONTRAST
TECHNIQUE: Multidetector CT imaging of the chest was performed following the
standard protocol without IV contrast.

[Series 2: thorax · axial · 0.76mm/px · z∈[-329,-77]mm · 12 of 150 slices shown, 15 images]
[im 12/150  mediastinal]
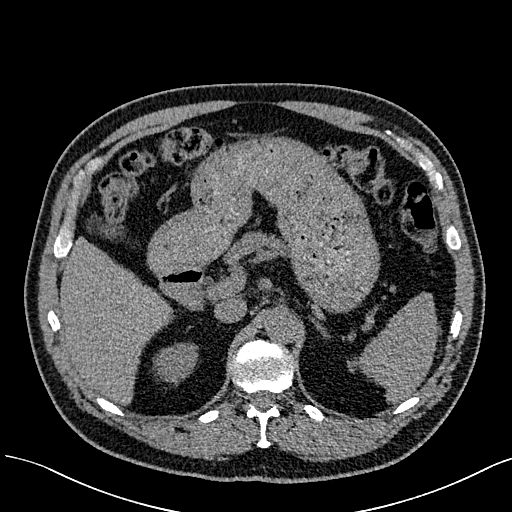
[im 12/150  lung]
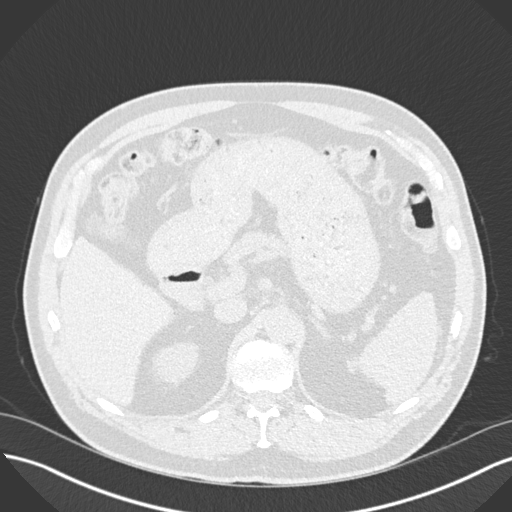
[im 23/150  lung]
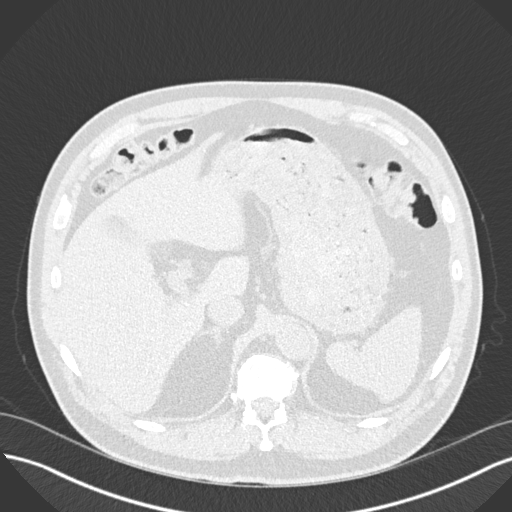
[im 35/150  lung]
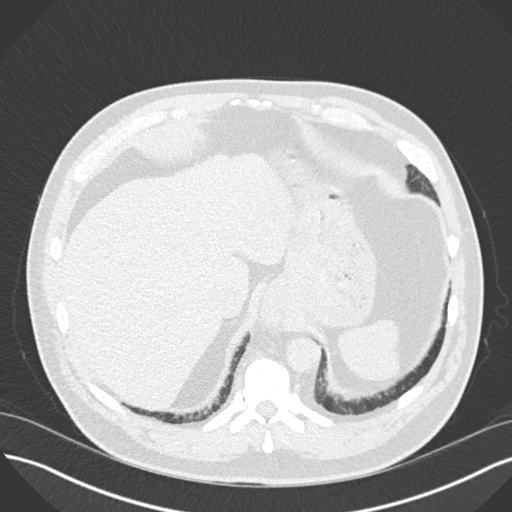
[im 46/150  lung]
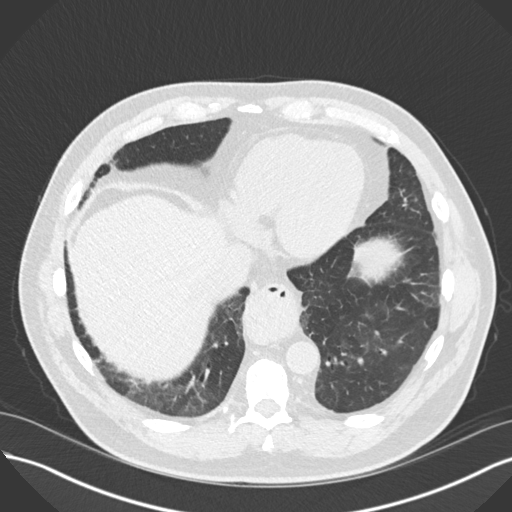
[im 58/150  mediastinal]
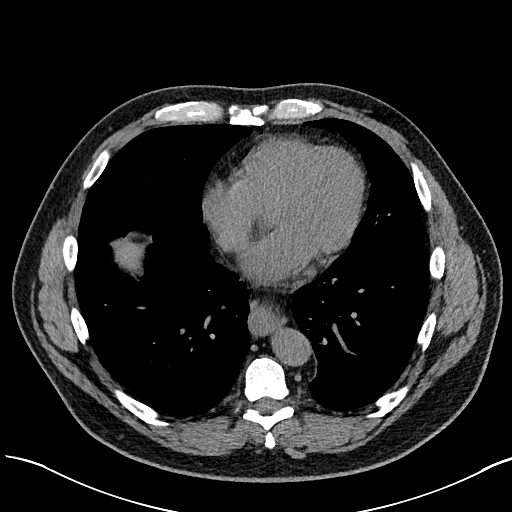
[im 58/150  lung]
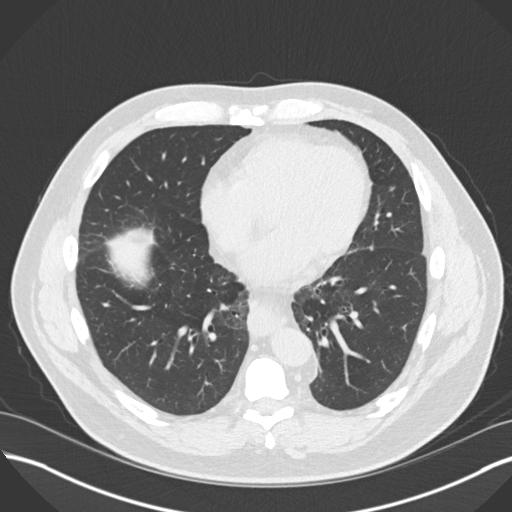
[im 69/150  lung]
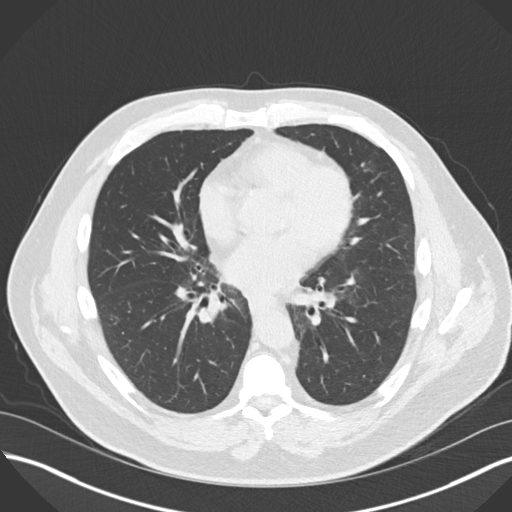
[im 81/150  lung]
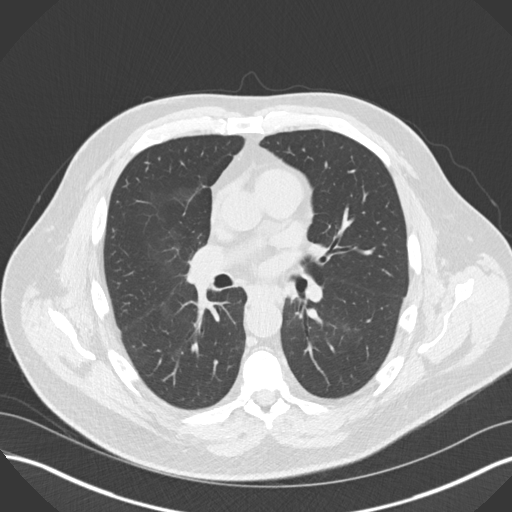
[im 92/150  lung]
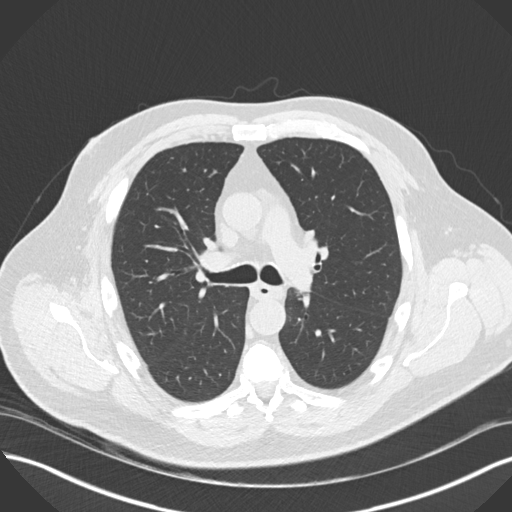
[im 104/150  mediastinal]
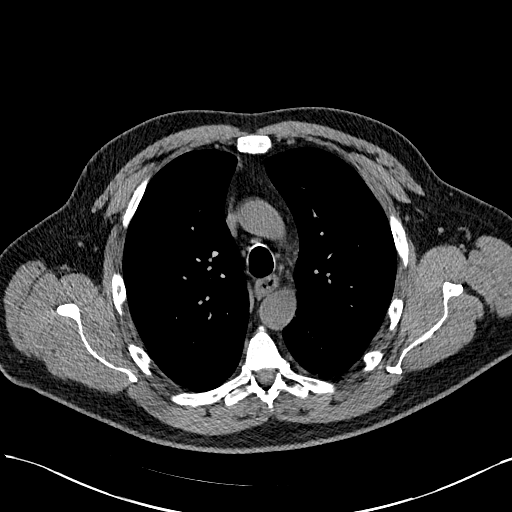
[im 104/150  lung]
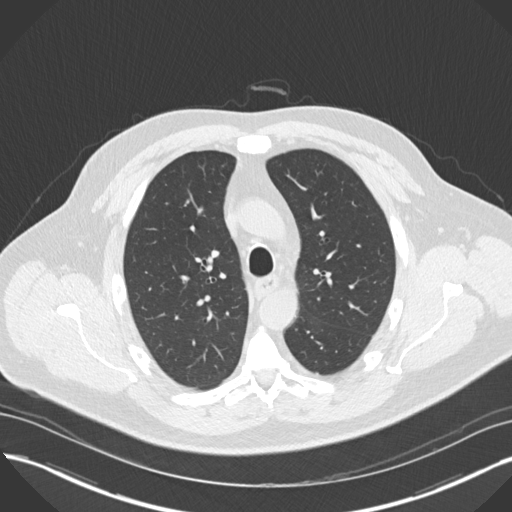
[im 115/150  lung]
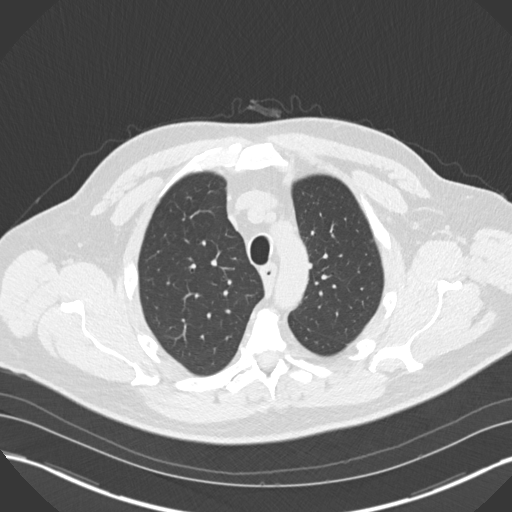
[im 127/150  lung]
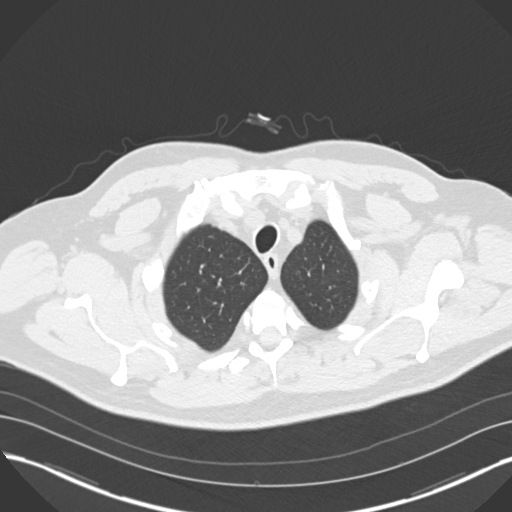
[im 138/150  lung]
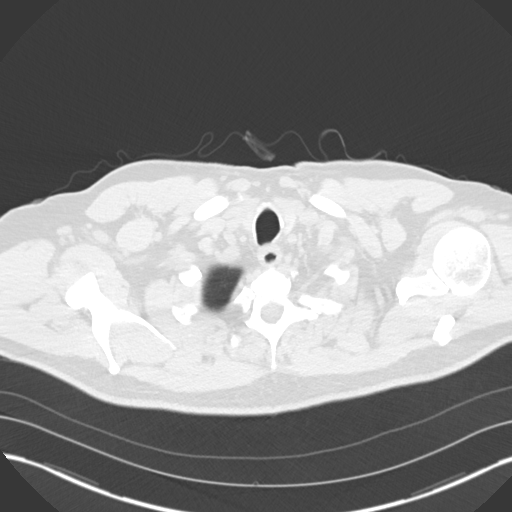

[Series 5: coronal · coronal · 0.59mm/px · 3 of 138 slices shown]
[im 28/138  lung]
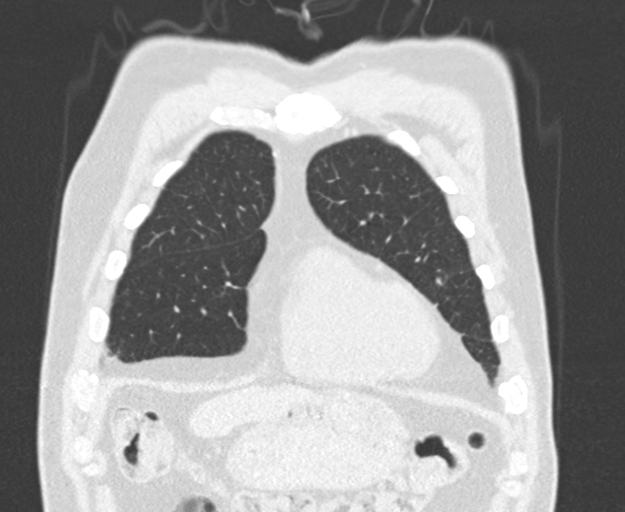
[im 55/138  lung]
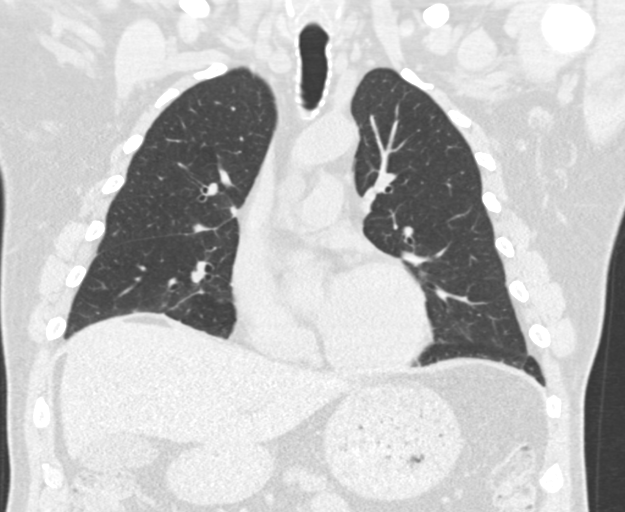
[im 83/138  lung]
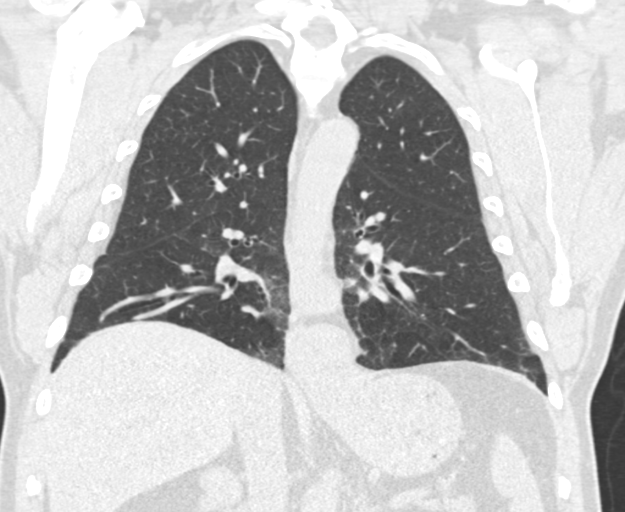

[15 of 36 positions shown; findings below may reference images not displayed]

FINDINGS: Cardiovascular: Scattered calcified atheromatous plaque in the
thoracic aorta. Heart size normal without pericardial effusion.
Normal caliber of the thoracic aorta and central pulmonary
vasculature. Limited assessment of cardiovascular structures given
lack of intravenous contrast.

Mediastinum/Nodes: Moderate size hiatal hernia extends into the
chest. Mild circumferential esophageal thickening and mildly
patulous esophagus. Hiatal hernia is larger than on previous
imaging. Thoracic inlet structures are normal. No axillary
lymphadenopathy.

Lungs/Pleura: Minimal atelectasis. No effusion. Mild subpleural
reticulation. Airways are patent.

Upper Abdomen: Incidental imaging of upper abdominal contents
without acute process. Imaged portions of liver, gallbladder,
pancreas, spleen and adrenal glands are unremarkable. Stomach
distended with ingested contents. No perigastric stranding.

Musculoskeletal: Spinal degenerative changes. No acute or
destructive bone finding.
IMPRESSION: 1. Moderate size hiatal hernia extends into the chest. Mild
circumferential esophageal thickening and mildly patulous esophagus.
Findings may represent esophagitis in the setting of hiatal hernia.
2. Aortic atherosclerosis.

Aortic Atherosclerosis (SOIK9-L19.9).

## 2022-06-01 ENCOUNTER — Telehealth: Payer: BC Managed Care – PPO | Admitting: Family Medicine

## 2022-06-01 DIAGNOSIS — H10021 Other mucopurulent conjunctivitis, right eye: Secondary | ICD-10-CM

## 2022-06-01 MED ORDER — POLYMYXIN B-TRIMETHOPRIM 10000-0.1 UNIT/ML-% OP SOLN: 1.0000 [drp] | Freq: Four times a day (QID) | OPHTHALMIC | 0 refills | Status: DC

## 2022-06-01 NOTE — Progress Notes (Signed)

## 2022-06-29 ENCOUNTER — Other Ambulatory Visit: Payer: Self-pay | Admitting: Internal Medicine

## 2022-07-02 ENCOUNTER — Other Ambulatory Visit (HOSPITAL_COMMUNITY): Payer: Self-pay

## 2022-07-02 ENCOUNTER — Other Ambulatory Visit (HOSPITAL_COMMUNITY): Payer: Self-pay | Admitting: *Deleted

## 2022-07-02 MED ORDER — TICAGRELOR 60 MG PO TABS
60.0000 mg | ORAL_TABLET | Freq: Two times a day (BID) | ORAL | 0 refills | Status: DC
  Filled 2022-07-02: qty 60, 30d supply, fill #0

## 2022-07-17 ENCOUNTER — Encounter (HOSPITAL_COMMUNITY): Payer: Self-pay | Admitting: Cardiology

## 2022-07-19 ENCOUNTER — Other Ambulatory Visit (HOSPITAL_COMMUNITY): Payer: Self-pay | Admitting: Cardiology

## 2022-07-21 ENCOUNTER — Other Ambulatory Visit (HOSPITAL_COMMUNITY): Payer: Self-pay

## 2022-07-21 ENCOUNTER — Encounter: Payer: Self-pay | Admitting: Family Medicine

## 2022-07-21 ENCOUNTER — Ambulatory Visit (INDEPENDENT_AMBULATORY_CARE_PROVIDER_SITE_OTHER): Payer: Medicare Other | Admitting: Family Medicine

## 2022-07-21 VITALS — BP 116/80 | HR 64 | Temp 97.8°F | Ht 65.75 in | Wt 158.7 lb

## 2022-07-21 DIAGNOSIS — Z23 Encounter for immunization: Secondary | ICD-10-CM | POA: Diagnosis not present

## 2022-07-21 DIAGNOSIS — Z0000 Encounter for general adult medical examination without abnormal findings: Secondary | ICD-10-CM | POA: Diagnosis not present

## 2022-07-21 LAB — BASIC METABOLIC PANEL
BUN: 19 mg/dL (ref 6–23)
CO2: 28 mEq/L (ref 19–32)
Calcium: 10.2 mg/dL (ref 8.4–10.5)
Chloride: 103 mEq/L (ref 96–112)
Creatinine, Ser: 0.94 mg/dL (ref 0.40–1.50)
GFR: 85.13 mL/min (ref >60.00)
Glucose, Bld: 98 mg/dL (ref 70–99)
Potassium: 4.6 mEq/L (ref 3.5–5.1)
Sodium: 139 mEq/L (ref 135–145)

## 2022-07-21 LAB — CBC WITH DIFFERENTIAL/PLATELET
Basophils Absolute: 0.1 10*3/uL (ref 0.0–0.1)
Basophils Relative: 0.8 % (ref 0.0–3.0)
Eosinophils Absolute: 0.3 10*3/uL (ref 0.0–0.7)
Eosinophils Relative: 5 % (ref 0.0–5.0)
HCT: 50.4 % (ref 39.0–52.0)
Hemoglobin: 17 g/dL (ref 13.0–17.0)
Lymphocytes Relative: 16.6 % (ref 12.0–46.0)
Lymphs Abs: 1.1 10*3/uL (ref 0.7–4.0)
MCHC: 33.8 g/dL (ref 30.0–36.0)
MCV: 93.8 fl (ref 78.0–100.0)
Monocytes Absolute: 0.6 10*3/uL (ref 0.1–1.0)
Monocytes Relative: 9.3 % (ref 3.0–12.0)
Neutro Abs: 4.6 10*3/uL (ref 1.4–7.7)
Neutrophils Relative %: 68.3 % (ref 43.0–77.0)
Platelets: 223 10*3/uL (ref 150.0–400.0)
RBC: 5.38 Mil/uL (ref 4.22–5.81)
RDW: 14.3 % (ref 11.5–15.5)
WBC: 6.7 10*3/uL (ref 4.0–10.5)

## 2022-07-21 LAB — HEPATIC FUNCTION PANEL
ALT: 32 U/L (ref 0–53)
AST: 29 U/L (ref 0–37)
Albumin: 4.9 g/dL (ref 3.5–5.2)
Alkaline Phosphatase: 110 U/L (ref 39–117)
Bilirubin, Direct: 0.3 mg/dL (ref 0.0–0.3)
Total Bilirubin: 1.4 mg/dL — ABNORMAL HIGH (ref 0.2–1.2)
Total Protein: 7.4 g/dL (ref 6.0–8.3)

## 2022-07-21 LAB — LIPID PANEL
Cholesterol: 106 mg/dL (ref 0–200)
HDL: 47.2 mg/dL (ref >39.00)
LDL Cholesterol: 48 mg/dL (ref 0–99)
NonHDL: 58.69
Total CHOL/HDL Ratio: 2
Triglycerides: 52 mg/dL (ref 0.0–149.0)
VLDL: 10.4 mg/dL (ref 0.0–40.0)

## 2022-07-21 LAB — PSA, MEDICARE: PSA: 1.09 ng/ml (ref 0.10–4.00)

## 2022-07-21 MED ORDER — METOPROLOL SUCCINATE ER 25 MG PO TB24: 12.5000 mg | ORAL_TABLET | Freq: Every day | ORAL | 2 refills | Status: DC

## 2022-07-21 NOTE — Progress Notes (Signed)
Established Patient Office Visit  Subjective   Patient ID: Jerry Kramer, male    DOB: [birth year]  Age: 67 y.o. MRN: [id number]  Chief Complaint  Patient presents with   Annual Exam    HPI   Jerry Kramer is seen for physical exam.  Generally doing well this year.  He has managed to lose additional weight and is staying quite active.  He has history of GERD and hiatal hernia and couple years ago underwent robotic assisted laparoscopic paraesophageal hernia repair with fundoplication in March 6213.  He developed PVCs postoperatively and anterolateral ST elevation MI.  Initial echo with ejection fraction 25 to 30% and more recent echo EF 55 to 60%.  He just retired November 10.  He still does some part-time Dealer work.  Good tolerance with day-to-day activities.  Denies any recent dyspnea, dizziness, peripheral edema, or chest pains.  Health maintenance reviewed  -No history of Prevnar. -Flu vaccine already given -Tetanus up-to-date -Due for repeat colonoscopy in March  Family history and social history reviewed with no significant changes.  Past Medical History:  Diagnosis Date   Allergy    CHF (congestive heart failure) ([place])    Colon polyps    Complication of anesthesia    hard to wake up   CONTACT DERMATITIS 09/03/2009   ELEVATED BLOOD PRESSURE 06/19/2010   HYPERLIPIDEMIA 08/27/2009   UNSPECIFIED OTALGIA 06/19/2010   Past Surgical History:  Procedure Laterality Date   APPENDECTOMY  1983   BALLOON DILATION N/A 02/18/2022   Procedure: BALLOON DILATION;  Surgeon: Jerene Bears, MD;  Location: [place];  Service: Gastroenterology;  Laterality: N/A;   BIOPSY  08/06/2020   Procedure: BIOPSY;  Surgeon: Lavena Bullion, DO;  Location: [facility];  Service: Gastroenterology;;  esophageal manometry probe  placement   CORONARY/GRAFT ACUTE MI REVASCULARIZATION N/A 09/15/2020   Procedure: Coronary/Graft Acute MI Revascularization;  Surgeon: Leonie Man, MD;  Location: [place]  [facility];  Service: Cardiovascular;  Laterality: N/A;   ESOPHAGEAL MANOMETRY N/A 08/06/2020   Procedure: ESOPHAGEAL MANOMETRY (EM);  Surgeon: Lavena Bullion, DO;  Location: [facility];  Service: Gastroenterology;  Laterality: N/A;   ESOPHAGOGASTRODUODENOSCOPY N/A 09/15/2020   Procedure: ESOPHAGOGASTRODUODENOSCOPY (EGD);  Surgeon: Lajuana Matte, MD;  Location: [place];  Service: Thoracic;  Laterality: N/A;   ESOPHAGOGASTRODUODENOSCOPY (EGD) WITH PROPOFOL N/A 08/06/2020   Procedure: ESOPHAGOGASTRODUODENOSCOPY (EGD) WITH PROPOFOL;  Surgeon: Lavena Bullion, DO;  [facility];  Service: Gastroenterology;  Laterality: N/A;   ESOPHAGOGASTRODUODENOSCOPY (EGD) WITH PROPOFOL N/A 01/09/2022   Procedure: ESOPHAGOGASTRODUODENOSCOPY (EGD) WITH PROPOFOL;  Surgeon: Carol Ada, MD;  Location: [place];  Service: Gastroenterology;  Laterality: N/A;   ESOPHAGOGASTRODUODENOSCOPY (EGD) WITH PROPOFOL N/A 02/18/2022   Procedure: ESOPHAGOGASTRODUODENOSCOPY (EGD) WITH PROPOFOL;  Surgeon: Jerene Bears, MD;  Location: [facility];  Service: Gastroenterology;  Laterality: N/A;   EYE MUSCLE SURGERY Right    LEFT HEART CATH AND CORONARY ANGIOGRAPHY N/A 09/15/2020   Procedure: LEFT HEART CATH AND CORONARY ANGIOGRAPHY;  Surgeon: Leonie Man, MD;  Location: [place] [facility];  Service: Cardiovascular;  Laterality: N/A;   MIDDLE EAR SURGERY     ROTATOR CUFF REPAIR Left    TONSILLECTOMY  1965   XI ROBOTIC ASSISTED HIATAL HERNIA REPAIR N/A 09/15/2020   Procedure: XI ROBOTIC ASSISTED HIATAL HERNIA REPAIR WITH MESH;  Surgeon: Lajuana Matte, MD;  Location: [place];  Service: Thoracic;  Laterality: N/A;    reports that he has never smoked. He has never  used smokeless tobacco. He reports that he does not drink alcohol and does not use drugs. family history includes Cancer in his mother; Colon polyps in his father; Diabetes in his sister; Heart attack (age of onset: 3) in his brother; Heart disease  (age of onset: 70) in his brother and mother; Hyperlipidemia in his father; Stroke in his father. Allergies  Allergen Reactions   Meperidine Hcl Nausea And Vomiting   Penicillins Other (See Comments)    REACTION: Childhood   Advil [Ibuprofen] Palpitations   Pseudoephedrine Hcl Palpitations    Review of Systems  Constitutional:  Negative for chills, fever, malaise/fatigue and weight loss.  HENT:  Negative for hearing loss.   Eyes:  Negative for blurred vision and double vision.  Respiratory:  Negative for cough and shortness of breath.   Cardiovascular:  Negative for chest pain, palpitations and leg swelling.  Gastrointestinal:  Negative for abdominal pain, blood in stool, constipation and diarrhea.  Genitourinary:  Negative for dysuria.  Skin:  Negative for rash.  Neurological:  Negative for dizziness, speech change, seizures, loss of consciousness and headaches.  Psychiatric/Behavioral:  Negative for depression.       Objective:     BP 116/80 (BP Location: Left Arm, Patient Position: Sitting, Cuff Size: Normal)   Pulse 64   Temp 97.8 F (36.6 C) (Oral)   Ht 5' 5.75" (1.67 m)   Wt 158 lb 11.2 oz (72 kg)   SpO2 98%   BMI 25.81 kg/m  BP Readings from Last 3 Encounters:  07/21/22 116/80  03/22/22 98/64  02/18/22 112/80   Wt Readings from Last 3 Encounters:  07/21/22 158 lb 11.2 oz (72 kg)  03/22/22 167 lb (75.8 kg)  02/18/22 161 lb (73 kg)      Physical Exam Vitals reviewed.  Constitutional:      General: He is not in acute distress.    Appearance: He is well-developed.  HENT:     Head: Normocephalic and atraumatic.     Right Ear: External ear normal.     Left Ear: External ear normal.     Ears:     Comments: Has significant scarring right TM from remote surgery years ago Eyes:     Conjunctiva/sclera: Conjunctivae normal.     Pupils: Pupils are equal, round, and reactive to light.  Neck:     Thyroid: No thyromegaly.  Cardiovascular:     Rate and Rhythm:  Normal rate and regular rhythm.     Heart sounds: Normal heart sounds. No murmur heard. Pulmonary:     Effort: No respiratory distress.     Breath sounds: No wheezing or rales.  Abdominal:     General: Bowel sounds are normal. There is no distension.     Palpations: Abdomen is soft. There is no mass.     Tenderness: There is no abdominal tenderness. There is no guarding or rebound.  Musculoskeletal:     Cervical back: Normal range of motion and neck supple.     Right lower leg: No edema.     Left lower leg: No edema.  Lymphadenopathy:     Cervical: No cervical adenopathy.  Skin:    Findings: No rash.  Neurological:     Mental Status: He is alert and oriented to person, place, and time.     Cranial Nerves: No cranial nerve deficit.  Psychiatric:        Mood and Affect: Mood normal.      No results found for any visits  on 07/21/22.    The ASCVD Risk score (Arnett DK, et al., 2019) failed to calculate for the following reasons:   The patient has a prior MI or stroke diagnosis    Assessment & Plan:   Problem List Items Addressed This Visit   None Visit Diagnoses     Need for pneumococcal 20-valent conjugate vaccination    -  Primary   Relevant Orders   Pneumococcal conjugate vaccine 20-valent (Prevnar 20)   Physical exam       Relevant Orders   Basic metabolic panel   Lipid panel   CBC with Differential/Platelet   Hepatic function panel   PSA, Medicare     67 year old male with history of CAD as above.  He has history of heart failure with reduced ejection fraction but doing well medically at this time. -Flu vaccine already given -Prevnar 20 given -We discussed Shingrix vaccine and he will consider at some point this year -Obtain screening labs as above -Continue close follow-up with cardiology -Patient plans to contact GI regarding follow-up colonoscopy which will be due in a couple months  No follow-ups on file.    Carolann Littler, MD

## 2022-07-21 NOTE — Progress Notes (Signed)
toprol

## 2022-07-21 NOTE — Patient Instructions (Signed)
Consider Shingrix vaccine at some point this year.  

## 2022-07-22 ENCOUNTER — Telehealth: Payer: Self-pay | Admitting: Family Medicine

## 2022-07-22 NOTE — Telephone Encounter (Signed)
Pt call and stated he is returning your call and want a call back.

## 2022-07-22 NOTE — Telephone Encounter (Signed)
Please see result note 

## 2022-07-31 ENCOUNTER — Other Ambulatory Visit (HOSPITAL_COMMUNITY): Payer: Self-pay | Admitting: Cardiology

## 2022-08-02 NOTE — Telephone Encounter (Signed)
This is a CHF pt 

## 2022-08-02 NOTE — Telephone Encounter (Signed)
Established patient Dr.McLean

## 2022-09-06 ENCOUNTER — Telehealth: Payer: Self-pay | Admitting: Family Medicine

## 2022-09-06 NOTE — Telephone Encounter (Signed)
I send out bulk communication for annual wellness appointment.  Patient left message wanting to schedule appointment.  Patient is due for Welcome to Medicare appointment before 05/29/23

## 2022-09-16 ENCOUNTER — Inpatient Hospital Stay (HOSPITAL_COMMUNITY)
Admission: RE | Admit: 2022-09-16 | Discharge: 2022-09-16 | Disposition: A | Discharge status: Home / Self Care | Payer: Medicare Other | Source: Ambulatory Visit | Attending: Cardiology | Admitting: Cardiology

## 2022-09-16 ENCOUNTER — Other Ambulatory Visit (HOSPITAL_COMMUNITY): Payer: Self-pay | Admitting: Cardiology

## 2022-09-16 ENCOUNTER — Ambulatory Visit (HOSPITAL_COMMUNITY)
Admission: RE | Admit: 2022-09-16 | Discharge: 2022-09-16 | Disposition: A | Discharge status: Home / Self Care | Payer: Medicare Other | Source: Ambulatory Visit | Attending: Cardiology | Admitting: Cardiology

## 2022-09-16 ENCOUNTER — Encounter (HOSPITAL_COMMUNITY): Payer: Self-pay | Admitting: Cardiology

## 2022-09-16 DIAGNOSIS — Z955 Presence of coronary angioplasty implant and graft: Secondary | ICD-10-CM | POA: Diagnosis not present

## 2022-09-16 DIAGNOSIS — I255 Ischemic cardiomyopathy: Secondary | ICD-10-CM | POA: Insufficient documentation

## 2022-09-16 DIAGNOSIS — Z7982 Long term (current) use of aspirin: Secondary | ICD-10-CM | POA: Diagnosis not present

## 2022-09-16 DIAGNOSIS — I493 Ventricular premature depolarization: Secondary | ICD-10-CM

## 2022-09-16 DIAGNOSIS — Z79899 Other long term (current) drug therapy: Secondary | ICD-10-CM | POA: Diagnosis not present

## 2022-09-16 DIAGNOSIS — E785 Hyperlipidemia, unspecified: Secondary | ICD-10-CM | POA: Insufficient documentation

## 2022-09-16 DIAGNOSIS — I110 Hypertensive heart disease with heart failure: Secondary | ICD-10-CM | POA: Insufficient documentation

## 2022-09-16 DIAGNOSIS — Z7902 Long term (current) use of antithrombotics/antiplatelets: Secondary | ICD-10-CM | POA: Diagnosis not present

## 2022-09-16 DIAGNOSIS — I2510 Atherosclerotic heart disease of native coronary artery without angina pectoris: Secondary | ICD-10-CM | POA: Diagnosis not present

## 2022-09-16 DIAGNOSIS — Z8249 Family history of ischemic heart disease and other diseases of the circulatory system: Secondary | ICD-10-CM | POA: Diagnosis not present

## 2022-09-16 DIAGNOSIS — I252 Old myocardial infarction: Secondary | ICD-10-CM | POA: Diagnosis not present

## 2022-09-16 DIAGNOSIS — K219 Gastro-esophageal reflux disease without esophagitis: Secondary | ICD-10-CM | POA: Diagnosis not present

## 2022-09-16 DIAGNOSIS — I5022 Chronic systolic (congestive) heart failure: Secondary | ICD-10-CM | POA: Insufficient documentation

## 2022-09-16 LAB — BASIC METABOLIC PANEL
Anion gap: 10 (ref 5–15)
BUN: 13 mg/dL (ref 8–23)
CO2: 22 mmol/L (ref 22–32)
Calcium: 9.5 mg/dL (ref 8.9–10.3)
Chloride: 106 mmol/L (ref 98–111)
Creatinine, Ser: 1.1 mg/dL (ref 0.61–1.24)
GFR, Estimated: >60 mL/min (ref >60)
Glucose, Bld: 98 mg/dL (ref 70–99)
Potassium: 3.7 mmol/L (ref 3.5–5.1)
Sodium: 138 mmol/L (ref 135–145)

## 2022-09-16 LAB — MAGNESIUM: Magnesium: 1.9 mg/dL (ref 1.7–2.4)

## 2022-09-16 LAB — TSH: TSH: 1.244 u[IU]/mL (ref 0.350–4.500)

## 2022-09-16 NOTE — Patient Instructions (Signed)
There has been no changes to your medications.  Labs done today, your results will be available in MyChart, we will contact you for abnormal readings.  Your provider has recommended that  you wear a Zio Patch for 7 days.  This monitor will record your heart rhythm for our review.  IF you have any symptoms while wearing the monitor please press the button.  If you have any issues with the patch or you notice a red or orange light on it please call the company at [phone].  Once you remove the patch please mail it back to the company as soon as possible so we can get the results. PLACE WHEN YOU GET BACK FROM THE [facility]   Your physician recommends that you schedule a follow-up appointment in: 6 months ( September ) ** please call the office in July to arrange your follow up appointment. **  If you have any questions or concerns before your next appointment please send [place] a message through [place] or call our office at [phone].    TO LEAVE A MESSAGE FOR THE NURSE SELECT OPTION 2, PLEASE LEAVE A MESSAGE INCLUDING: YOUR NAME DATE OF BIRTH CALL BACK NUMBER REASON FOR CALL**this is important as we prioritize the call backs  YOU WILL RECEIVE A CALL BACK THE SAME DAY AS LONG AS YOU CALL BEFORE 4:00 PM  At the [facility], you and your health needs are our priority. As part of our continuing mission to provide you with exceptional heart care, we have created designated Provider Care Teams. These Care Teams include your primary Cardiologist (physician) and Advanced Practice Providers (APPs- Physician Assistants and Nurse Practitioners) who all work together to provide you with the care you need, when you need it.   You may see any of the following providers on your designated Care Team at your next follow up: Dr Glori Bickers Dr Loralie Champagne Dr. Roxana Hires, NP Lyda Jester, [place] [facility] [place], [place] Forestine Na, NP Audry Riles,  PharmD   Please be sure to bring in all your medications bottles to every appointment.    Thank you for choosing [facility]

## 2022-09-16 NOTE — Progress Notes (Signed)
Advanced Heart Failure Clinic Note    PCP: Jerry Post, MD Cardiologist: Dr. Aundra Kramer   HPI: 67 y.o. with history of HTN, hyperlipidemia, GERD/hiatal hernia.  He is a nonsmoker, mother and 2 brothers had MIs in 12s. Now w/ systolic heart failure and CAD. His daughter, Jerry Kramer, works in [employer] PACU.  He had been dealing with peptic stricture and hiatal hernia for a while now; he saw Dr. Kipp Kramer and underwent robotic assisted laparoscopic paraesophageal hernia repair with fundoplication on [id number].  Kramer-op, he was noted to have PVCs then developed chest pain with anterolateral ST elevation.  He was taken emergently for cath.  He was found to have acute occlusion of the proximal LAD.  This was treated with DES, and occluded D1 was treated with PTCA. LVEDP was around 26 mmHg.  After procedure, patient developed respiratory distress w/ lactic acidosis and AKI. Intubated and started on IV Lasix. Central line placed for co-ox and CVP monitoring. He was started on milrinone for low co-ox. Amiodarone started for PVC suppression. He responded well to therapy. Diuresed, extubated and able to wean off milrionone. PVCs well suppressed w/ amiodarone. GDMT initiated + DAPT w/ ASA + Brilinta for LAD stent. Amiodarone was discontinued and low dose ? blocker added. Discharged home w/ [employer].  Echo this admission showed EF 25-30%.   Repeat echo in 6/22 showed EF up to 55-60% with mild mid-apical anterior hypokinesis, normal RV.  Echo in 9/23 showed EF 0000000, grade 2 diastolic dysfunction, apical hypokinesis.   Patient returns for followup of CAD. HR has been running low at times, got down to high 30s/40s at times.  I had him stop Coreg and start low dose Toprol XL 12.5 daily.  ECG today shows several PVCs.  He does not feel palpitations or lightheadedness. No exertional dyspnea or chest pain.  He continues to play racketball about twice a week, very good exercise tolerance.   Weight has been stable.   ECG  (personally reviewed): NSR with trigeminal PVCs  Labs (4/22): K 4.1, creatinine 1.09 Labs (6/22): K 3.6, creatinine 0.87, LDL 40, TGs 69 Labs (12/22): K 3.9, creatinine 0.95 Labs (3/23): LDL 39, HDL 39, K 3.6, creatinine 0.82 Labs (9/23): Lp(a) 19, hs-CRP 0.7 Labs (1/24): LDL 48, TGs 52, K 4.6, creatinine 0.94  PMH: 1. Hyperlipidemia 2. GERD with hiatal hernia and peptic stricture.  S/p robotic assisted laparoscopic paraesophageal hernia repair with fundoplication on [id number].  3. CAD: Acute anterolateral STEMI 3/22 Kramer-op paraesophageal hernia repair. 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  DES to LAD and PTCA D1.  4. Chronic systolic CHF: Ischemic cardiomyopathy.  - Echo (3/22): EF 25-30%, LAD territory WMAs, normal RV, trivial MR.  - Echo (6/22): 55-60% with mild mid-apical anterior hypokinesis, normal RV. - Echo (9/23): EF 0000000, grade 2 diastolic dysfunction, apical hypokinesis. 5. HTN 6. PVCs   Current Outpatient Medications  Medication Sig Dispense Refill   aspirin 81 MG chewable tablet Chew 1 tablet (81 mg total) by mouth daily. 30 tablet 6   atorvastatin (LIPITOR) 80 MG tablet Take 1 tablet (80 mg total) by mouth daily. 90 tablet 3   dapagliflozin propanediol (FARXIGA) 10 MG TABS tablet Take 1 tablet (10 mg total) by mouth daily. 90 tablet 3   ENTRESTO 24-26 MG TAKE 1 TABLET BY MOUTH TWICE A DAY 180 tablet 1   metoprolol succinate (TOPROL XL) 25 MG 24 hr tablet Take 0.5 tablets (12.5 mg  total) by mouth daily. 30 tablet 2   pantoprazole (PROTONIX) 40 MG tablet TAKE 1 TABLET BY MOUTH DAILY 90 tablet 0   sildenafil (VIAGRA) 100 MG tablet Take 1 tablet (100 mg total) by mouth daily as needed for erectile dysfunction. 10 tablet 0   spironolactone (ALDACTONE) 25 MG tablet TAKE 1 TABLET BY MOUTH EVERYDAY AT BEDTIME 90 tablet 2   ticagrelor (BRILINTA) 60 MG TABS tablet Take 1 tablet (60 mg total) by  mouth 2 (two) times daily. 60 tablet 0   No current facility-administered medications for this encounter.    Allergies  Allergen Reactions   Meperidine Hcl Nausea And Vomiting   Penicillins Other (See Comments)    REACTION: Childhood   Advil [Ibuprofen] Palpitations   Pseudoephedrine Hcl Palpitations      Social History   Socioeconomic History   Marital status: Married    Spouse name: Not on file   Number of children: Not on file   Years of education: Not on file   Highest education level: Not on file  Occupational History   Not on file  Tobacco Use   Smoking status: Never   Smokeless tobacco: Never  Vaping Use   Vaping Use: Never used  Substance and Sexual Activity   Alcohol use: No   Drug use: No   Sexual activity: Not on file  Other Topics Concern   Not on file  Social History Narrative   Occupation: Secondary school teacher   Married   Never Smoked   Alcohol use- no   Social Determinants of Radio broadcast assistant Strain: Not on file  Food Insecurity: Not on file  Transportation Needs: Not on file  Physical Activity: Not on file  Stress: Not on file  Social Connections: Not on file  Intimate Partner Violence: Not on file      Family History  Problem Relation Age of Onset   Heart disease Mother 35       CAD   Cancer Mother        lung   Hyperlipidemia Father    Stroke Father    Colon polyps Father    Diabetes Sister        type II   Heart attack Brother 80   Heart disease Brother 8       MI   Colon cancer Neg Hx    Esophageal cancer Neg Hx    Pancreatic cancer Neg Hx    Stomach cancer Neg Hx    Rectal cancer Neg Hx     There were no vitals filed for this visit.  PHYSICAL EXAM: General: NAD Neck: No JVD, no thyromegaly or thyroid nodule.  Lungs: Clear to auscultation bilaterally with normal respiratory effort. CV: Nondisplaced PMI.  Heart regular S1/S2, no S3/S4, no murmur.  No peripheral edema.  No carotid bruit.  Normal pedal pulses.   Abdomen: Soft, nontender, no hepatosplenomegaly, no distention.  Skin: Intact without lesions or rashes.  Neurologic: Alert and oriented x 3.  Psych: Normal affect. Extremities: No clubbing or cyanosis.  HEENT: Normal.   ASSESSMENT & PLAN: 1.  CAD: Kramer-op anterolateral STEMI 09/15/20.  No prior CAD but history of HTN and hyperlipidemia and strong FH of CAD.  He had occluded proximal LAD and D1, now s/p DES to LAD and PTCA to D1.  No chest pain.  - Continue ASA 81 daily and ticagrelor 60 mg bid.   - Continue atorvastatin 80 mg nightly. Good lipids in 1/24. Lp(a) in 9/23  was not elevated.  2. Systolic CHF: Ischemic cardiomyopathy. Echo in 3/22 with EF 25-30%, LAD territory WMAs with no evidence for mechanical MI complications.  Repeat echo in 6/22 with EF up to 55-60% with mid-apical anterior hypokinesis. Echo today showed EF 0000000, grade 2 diastolic dysfunction, apical hypokinesis. NYHA class I, not volume overloaded on exam.  - Continue Entresto 24/26 bid.  BMET today.  - Continue spironolactone 25 mg  - Continue dapagliflozin 10 mg daily.   - Continue Toprol XL 12.5 mg daily.  3. PVCs: Not symptomatic. Low heart rate noted at home may have been due to frequent/uncounted PVCs.   - Continue Toprol XL 12.5 mg daily.  - I will arrange for Zio monitor x 1 week to assess PVCs.  - Check K, Mg, TSH.   Followup in 6 months.   Loralie Champagne, MD 09/16/22

## 2022-09-17 ENCOUNTER — Encounter (HOSPITAL_COMMUNITY): Payer: Self-pay | Admitting: Cardiology

## 2022-09-19 ENCOUNTER — Other Ambulatory Visit: Payer: Self-pay | Admitting: Internal Medicine

## 2022-10-05 DIAGNOSIS — I493 Ventricular premature depolarization: Secondary | ICD-10-CM | POA: Diagnosis not present

## 2022-10-11 ENCOUNTER — Telehealth (HOSPITAL_COMMUNITY): Payer: Self-pay | Admitting: *Deleted

## 2022-10-11 DIAGNOSIS — I493 Ventricular premature depolarization: Secondary | ICD-10-CM

## 2022-10-11 DIAGNOSIS — I5022 Chronic systolic (congestive) heart failure: Secondary | ICD-10-CM

## 2022-10-11 MED ORDER — MEXILETINE HCL 150 MG PO CAPS: 150.0000 mg | ORAL_CAPSULE | Freq: Two times a day (BID) | ORAL | 3 refills | Status: DC

## 2022-10-11 MED ORDER — METOPROLOL SUCCINATE ER 25 MG PO TB24
25.0000 mg | ORAL_TABLET | Freq: Every day | ORAL | 3 refills | Status: DC
Start: 2022-10-11 — End: 2023-09-19

## 2022-10-11 NOTE — Telephone Encounter (Signed)
Called patient and his wife per Dr. Shirlee Latch with following Zio monitor results:  "I suspect low HR readings were due to PVCs.  He has a lot of PVCs, 14.4%. Also has runs of NSVT."  Instructed patient and his wife per Dr. Shirlee Latch of the following:   1. Increase ToproL XL to 25 mg daily.  2. Start mexiletine 150 mg bid.  3. I would like him to get cardiac PET to assess for ischemia.  4. I would like to have him referred to EP for ventricular arrhythmias.   Toprol and mexiletine Rx sent to local pharmacy. 2.   Cardiac PET scan ordered. Informed pt/wife we will obtain prior authorization (if required) and then          Radiology scheduling should contact patient to schedule.  3.   Referral sent to Electrophysiology as ordered. EP office should reach out to patient and schedule first         appointment.   Both verbalized understanding of above. If any questions, they may call Heart Failure Clinic at [phone].

## 2022-10-13 ENCOUNTER — Telehealth: Payer: Self-pay | Admitting: *Deleted

## 2022-10-13 NOTE — Telephone Encounter (Signed)
Cardiac PET approved  CPT Code 16109 Description: Myocard imag multiple studies Authorization Number: [id number] Expiration Date: 11/27/2022 Status: Your case has been Approved.  Message sent to [facility] cardiac PET pool to schedule.

## 2022-10-27 ENCOUNTER — Other Ambulatory Visit (HOSPITAL_COMMUNITY): Payer: Self-pay | Admitting: Cardiology

## 2022-11-05 ENCOUNTER — Ambulatory Visit: Payer: Medicare Other

## 2022-11-05 ENCOUNTER — Encounter: Payer: Self-pay | Admitting: Cardiology

## 2022-11-05 ENCOUNTER — Ambulatory Visit: Payer: Medicare Other | Attending: Cardiology | Admitting: Cardiology

## 2022-11-05 VITALS — BP 110/84 | HR 72 | Ht 65.75 in | Wt 159.0 lb

## 2022-11-05 DIAGNOSIS — I2510 Atherosclerotic heart disease of native coronary artery without angina pectoris: Secondary | ICD-10-CM

## 2022-11-05 DIAGNOSIS — I493 Ventricular premature depolarization: Secondary | ICD-10-CM

## 2022-11-05 DIAGNOSIS — I5022 Chronic systolic (congestive) heart failure: Secondary | ICD-10-CM

## 2022-11-05 MED ORDER — MEXILETINE HCL 250 MG PO CAPS: 250.0000 mg | ORAL_CAPSULE | Freq: Two times a day (BID) | ORAL | 6 refills | Status: DC

## 2022-11-05 NOTE — Patient Instructions (Addendum)
Medication Instructions:  Your physician has recommended you make the following change in your medication:  INCREASE Mexiletine to 250 mg twice a day  *If you need a refill on your cardiac medications before your next appointment, please call your pharmacy*   Lab Work: None ordered   Testing/Procedures:                           ZIO XT- Long Term Monitor Instructions  Your physician has requested you wear a ZIO patch monitor for 7 days - START THIS AFTER YOUR PET SCAN.  This is a single patch monitor. [employer] supplies one patch monitor per enrollment. Additional stickers are not available. Please do not apply patch if you will be having a Nuclear Stress Test,  Echocardiogram, Cardiac CT, MRI, or Chest Xray during the period you would be wearing the  monitor. The patch cannot be worn during these tests. You cannot remove and re-apply the  ZIO XT patch monitor.  Your ZIO patch monitor will be mailed 3 day USPS to your address on file. It may take 3-5 days  to receive your monitor after you have been enrolled.  Once you have received your monitor, please review the enclosed instructions. Your monitor  has already been registered assigning a specific monitor serial # to you.  Billing and Patient Assistance Program Information  We have supplied [employer] with any of your insurance information on file for billing purposes. [employer] offers a sliding scale Patient Assistance Program for patients that do not have  insurance, or whose insurance does not completely cover the cost of the ZIO monitor.  You must apply for the Patient Assistance Program to qualify for this discounted rate.  To apply, please call [employer] at [phone], select option 4, select option 2, ask to apply for  Patient Assistance Program. Meredeth Ide will ask your household income, and how many people  are in your household. They will quote your out-of-pocket cost based on that information.  [employer] will also be able to set  up a 5-month, interest-free payment plan if needed.  Applying the monitor   Shave hair from upper left chest.  Hold abrader disc by orange tab. Rub abrader in 40 strokes over the upper left chest as  indicated in your monitor instructions.  Clean area with 4 enclosed alcohol pads. Let dry.  Apply patch as indicated in monitor instructions. Patch will be placed under collarbone on left  side of chest with arrow pointing upward.  Rub patch adhesive wings for 2 minutes. Remove white label marked "1". Remove the white  label marked "2". Rub patch adhesive wings for 2 additional minutes.  While looking in a mirror, press and release button in center of patch. A small green light will  flash 3-4 times. This will be your only indicator that the monitor has been turned on.  Do not shower for the first 24 hours. You may shower after the first 24 hours.  Press the button if you feel a symptom. You will hear a small click. Record Date, Time and  Symptom in the Patient Logbook.  When you are ready to remove the patch, follow instructions on the last 2 pages of Patient  Logbook. Stick patch monitor onto the last page of Patient Logbook.  Place Patient Logbook in the blue and white box. Use locking tab on box and tape box closed  securely. The blue and white box has prepaid postage on it.  Please place it in the mailbox as  soon as possible. Your physician should have your test results approximately 7 days after the  monitor has been mailed back to [facility].  Call [facility] Customer Care at [phone] if you have questions regarding  your ZIO XT patch monitor. Call them immediately if you see an orange light blinking on your  monitor.  If your monitor falls off in less than 4 days, contact our Monitor department at [phone].  If your monitor becomes loose or falls off after 4 days call [employer] at [phone] for  suggestions on securing your monitor   Follow-Up: At [facility], you and your health needs are our priority.  As part of our continuing mission to provide you with exceptional heart care, we have created designated Provider Care Teams.  These Care Teams include your primary Cardiologist (physician) and Advanced Practice Providers (APPs -  Physician Assistants and Nurse Practitioners) who all work together to provide you with the care you need, when you need it.  Your next appointment:   To be  determined after monitor results  The format for your next appointment:   In Person  Provider:   Loman Brooklyn, MD\    Thank you for choosing [facility]!!   Dory Horn, RN [phone]  Other Instructions

## 2022-11-05 NOTE — Progress Notes (Unsigned)
Enrolled patient for a 7 day Zio XT monitor to be mailed to patients home.  

## 2022-11-05 NOTE — Progress Notes (Signed)
Electrophysiology Office Note   Date:  11/05/2022   ID:  Jerry Kramer, DOB 12-12-1955, MRN [id number]  PCP:  Kristian Covey, MD  Cardiologist:  Shirlee Latch Primary Electrophysiologist:  Elie Leppo Jorja Loa, MD    Chief Complaint: PVC   History of Present Illness: Jerry Kramer is a 67 y.o. male who is being seen today for the evaluation of PVC at the request of Laurey Morale, MD. Presenting today for electrophysiology evaluation.  He has a history seen for hypertension, hyperlipidemia, coronary artery disease post anterolateral STEMI March 2022 with DES to the LAD, chronic systolic heart failure with recovered ejection fraction, PVCs.  He underwent robotic assisted laparoscopic paraesophageal hernia repair with fundoplication on 09/15/2020.  Postop he was noted to have PVCs and developed chest pain with anterolateral ST elevation.  He was found to have acute occlusion of the LAD.  He had DES to the LAD and was found to have an occluded D1 treated with PTCA.  Postop he developed respiratory distress requiring intubation.  He was started on amiodarone for PVCs with good response.  He was started off to medical therapy.  His ejection fraction has since improved.  Today, he denies symptoms of palpitations, chest pain, shortness of breath, orthopnea, PND, lower extremity edema, claudication, dizziness, presyncope, syncope, bleeding, or neurologic sequela. The patient is tolerating medications without difficulties.    Past Medical History:  Diagnosis Date   Allergy    CHF (congestive heart failure) (HCC)    Colon polyps    Complication of anesthesia    hard to wake up   CONTACT DERMATITIS 09/03/2009   ELEVATED BLOOD PRESSURE 06/19/2010   HYPERLIPIDEMIA 08/27/2009   UNSPECIFIED OTALGIA 06/19/2010   Past Surgical History:  Procedure Laterality Date   APPENDECTOMY  1983   BALLOON DILATION N/A 02/18/2022   Procedure: BALLOON DILATION;  Surgeon: Beverley Fiedler, MD;  Location: [facility] ENDOSCOPY;   Service: Gastroenterology;  Laterality: N/A;   BIOPSY  08/06/2020   Procedure: BIOPSY;  Surgeon: Shellia Cleverly, DO;  Location: [facility] ENDOSCOPY;  Service: Gastroenterology;;  esophageal manometry probe  placement   CORONARY/GRAFT ACUTE MI REVASCULARIZATION N/A 09/15/2020   Procedure: Coronary/Graft Acute MI Revascularization;  Surgeon: Marykay Lex, MD;  Location: [facility] INVASIVE CV LAB;  Service: Cardiovascular;  Laterality: N/A;   ESOPHAGEAL MANOMETRY N/A 08/06/2020   Procedure: ESOPHAGEAL MANOMETRY (EM);  Surgeon: Shellia Cleverly, DO;  Location: [facility] ENDOSCOPY;  Service: Gastroenterology;  Laterality: N/A;   ESOPHAGOGASTRODUODENOSCOPY N/A 09/15/2020   Procedure: ESOPHAGOGASTRODUODENOSCOPY (EGD);  Surgeon: Corliss Skains, MD;  Location: [facility] OR;  Service: Thoracic;  Laterality: N/A;   ESOPHAGOGASTRODUODENOSCOPY (EGD) WITH PROPOFOL N/A 08/06/2020   Procedure: ESOPHAGOGASTRODUODENOSCOPY (EGD) WITH PROPOFOL;  Surgeon: Shellia Cleverly, DO;  Location: [facility] ENDOSCOPY;  Service: Gastroenterology;  Laterality: N/A;   ESOPHAGOGASTRODUODENOSCOPY (EGD) WITH PROPOFOL N/A 01/09/2022   Procedure: ESOPHAGOGASTRODUODENOSCOPY (EGD) WITH PROPOFOL;  Surgeon: Jeani Hawking, MD;  Location: [facility] ENDOSCOPY;  Service: Gastroenterology;  Laterality: N/A;   ESOPHAGOGASTRODUODENOSCOPY (EGD) WITH PROPOFOL N/A 02/18/2022   Procedure: ESOPHAGOGASTRODUODENOSCOPY (EGD) WITH PROPOFOL;  Surgeon: Beverley Fiedler, MD;  Location: [facility] ENDOSCOPY;  Service: Gastroenterology;  Laterality: N/A;   EYE MUSCLE SURGERY Right    LEFT HEART CATH AND CORONARY ANGIOGRAPHY N/A 09/15/2020   Procedure: LEFT HEART CATH AND CORONARY ANGIOGRAPHY;  Surgeon: Marykay Lex, MD;  Location: [facility] INVASIVE CV LAB;  Service: Cardiovascular;  Laterality: N/A;   MIDDLE EAR SURGERY     ROTATOR CUFF REPAIR Left  TONSILLECTOMY  1965   XI ROBOTIC ASSISTED HIATAL HERNIA REPAIR N/A 09/15/2020   Procedure: XI ROBOTIC ASSISTED HIATAL HERNIA REPAIR WITH MESH;  Surgeon:  Corliss Skains, MD;  Location: [facility] OR;  Service: Thoracic;  Laterality: N/A;     Current Outpatient Medications  Medication Sig Dispense Refill   aspirin 81 MG chewable tablet Chew 1 tablet (81 mg total) by mouth daily. 30 tablet 6   atorvastatin (LIPITOR) 80 MG tablet Take 1 tablet (80 mg total) by mouth daily. 90 tablet 3   BRILINTA 60 MG TABS tablet TAKE 1 TABLET BY MOUTH TWO TIMES A DAY 60 tablet 0   dapagliflozin propanediol (FARXIGA) 10 MG TABS tablet Take 1 tablet (10 mg total) by mouth daily. 90 tablet 3   ENTRESTO 24-26 MG TAKE 1 TABLET BY MOUTH TWICE A DAY 180 tablet 1   metoprolol succinate (TOPROL XL) 25 MG 24 hr tablet Take 1 tablet (25 mg total) by mouth daily. 90 tablet 3   mexiletine (MEXITIL) 250 MG capsule Take 1 capsule (250 mg total) by mouth 2 (two) times daily. 60 capsule 6   pantoprazole (PROTONIX) 40 MG tablet TAKE 1 TABLET BY MOUTH DAILY 90 tablet 0   sildenafil (VIAGRA) 100 MG tablet Take 1 tablet (100 mg total) by mouth daily as needed for erectile dysfunction. 10 tablet 0   spironolactone (ALDACTONE) 25 MG tablet TAKE 1 TABLET BY MOUTH EVERYDAY AT BEDTIME 90 tablet 2   No current facility-administered medications for this visit.    Allergies:   Meperidine hcl, Penicillins, Advil [ibuprofen], and Pseudoephedrine hcl   Social History:  The patient  reports that he has never smoked. He has never used smokeless tobacco. He reports that he does not drink alcohol and does not use drugs.   Family History:  The patient's family history includes Cancer in his mother; Colon polyps in his father; Diabetes in his sister; Heart attack (age of onset: 41) in his brother; Heart disease (age of onset: 43) in his brother and mother; Hyperlipidemia in his father; Stroke in his father.    ROS:  Please see the history of present illness.   Otherwise, review of systems is positive for none.   All other systems are reviewed and negative.    PHYSICAL EXAM: VS:  BP 110/84    Pulse 72   Ht 5' 5.75" (1.67 m)   Wt 159 lb (72.1 kg)   SpO2 98%   BMI 25.86 kg/m  , BMI Body mass index is 25.86 kg/m. GEN: Well nourished, well developed, in no acute distress  HEENT: normal  Neck: no JVD, carotid bruits, or masses Cardiac: RRR; no murmurs, rubs, or gallops,no edema  Respiratory:  clear to auscultation bilaterally, normal work of breathing GI: soft, nontender, nondistended, + BS MS: no deformity or atrophy  Skin: warm and dry Neuro:  Strength and sensation are intact Psych: euthymic mood, full affect  EKG:  EKG is ordered today. Personal review of the ekg ordered shows sinus rhythm, PVCs  Recent Labs: 07/21/2022: ALT 32; Hemoglobin 17.0; Platelets 223.0 09/16/2022: BUN 13; Creatinine, Ser 1.10; Magnesium 1.9; Potassium 3.7; Sodium 138; TSH 1.244    Lipid Panel     Component Value Date/Time   CHOL 106 07/21/2022 0847   TRIG 52.0 07/21/2022 0847   HDL 47.20 07/21/2022 0847   CHOLHDL 2 07/21/2022 0847   VLDL 10.4 07/21/2022 0847   LDLCALC 48 07/21/2022 0847     Wt Readings from Last 3 Encounters:  11/05/22 159 lb (72.1 kg)  07/21/22 158 lb 11.2 oz (72 kg)  03/22/22 167 lb (75.8 kg)      Other studies Reviewed: Additional studies/ records that were reviewed today include: TTE 03/22/22  Review of the above records today demonstrates:   1. Left ventricular ejection fraction, by estimation, is 55 to 60%. The  left ventricle has normal function. The left ventricle demonstrates  regional wall motion abnormalities with apical hypokinesis. Left  ventricular diastolic parameters are consistent  with Grade II diastolic dysfunction (pseudonormalization).   2. Peak RV-RA gradient 17 mmHg. Right ventricular systolic function is  normal. The right ventricular size is normal.   3. Left atrial size was mild to moderately dilated.   4. The mitral valve is normal in structure. Trivial mitral valve  regurgitation. No evidence of mitral stenosis.   5. The aortic  valve is tricuspid. Aortic valve regurgitation is trivial.  No aortic stenosis is present.   6. Aortic dilatation noted. There is mild dilatation of the aortic root,  measuring 37 mm.   Cardiac monitor 10/10/2022 personally reviewed 1. Primarily NSR.  2. Frequent NSVT, longest run lasted 19.4 seconds.  3. Frequent PVCs, 14.4% of total beats.   ASSESSMENT AND PLAN:  1.  Nonsustained VT/PVCs: Burden of 14.4%.  Had an episode of VT 19.4 seconds.  He has been started on mexiletine.  Since starting mexiletine he has done well.  He has not noted any palpitations.  EKG today shows sinus rhythm with PVCs.  Rhythm strips for 1 minute show only those 3 PVCs on the initial twelve-lead.  It is certainly possible that his PVCs are well treated with mexiletine.  Macklin Jacquin increase his dose to 50 mg twice daily.  Debera Sterba have him wear a cardiac monitor after his PET scan later this month.  Markee Matera discuss with his primary cardiologist.  2.  Coronary artery disease: Status post anterolateral STEMI.  DES to the LAD with PTCA to the D1.  No current chest pain.  Plan per primary cardiology.  3.  Chronic systolic heart failure: Due to ischemic cardiomyopathy.  Ejection fraction has since normalized.  Continue off to medical therapy per primary cardiology.    Current medicines are reviewed at length with the patient today.   The patient does not have concerns regarding his medicines.  The following changes were made today: Increase mexiletine  Labs/ tests ordered today include:  Orders Placed This Encounter  Procedures   LONG TERM MONITOR (3-14 DAYS)   EKG 12-Lead     Disposition:   FU with Jamella Grayer 3 months  Signed, Ulmer Degen Jorja Loa, MD  11/05/2022 9:46 AM     [facility] [address] [place] [address] [phone] (office) [phone] (fax)

## 2022-11-14 ENCOUNTER — Encounter: Payer: Self-pay | Admitting: Cardiology

## 2022-11-17 MED ORDER — MEXILETINE HCL 150 MG PO CAPS: 150.0000 mg | ORAL_CAPSULE | Freq: Two times a day (BID) | ORAL | 1 refills | Status: DC

## 2022-11-17 NOTE — Telephone Encounter (Signed)
Pt reduced Mexiletine to 150 this past Saturday. He will not start monitor until after CT test next Tuesday.  So he will place monitor on Wednesday, 5/29. Aware that I will let Dr. Elberta Fortis know this news. Aware that we will await monitor results to see if PVC still high, will address treatment plan once monitor is back and reviewed. Pt appreciates my following up this  late with him and agreeable to plan.

## 2022-11-18 NOTE — Addendum Note (Signed)
Addended by: Laurey Morale on: 11/18/2022 03:47 PM   Modules accepted: Orders

## 2022-11-18 NOTE — Addendum Note (Signed)
Addended by: Noralee Space on: 11/18/2022 03:46 PM   Modules accepted: Orders

## 2022-11-19 ENCOUNTER — Telehealth (HOSPITAL_COMMUNITY): Payer: Self-pay | Admitting: *Deleted

## 2022-11-19 NOTE — Telephone Encounter (Signed)
Reaching out to patient to offer assistance regarding upcoming cardiac imaging study; pt verbalizes understanding of appt date/time, parking situation and where to check in, pre-test NPO status, and verified current allergies; name and call back number provided for further questions should they arise  Xylon Croom RN Navigator Cardiac Imaging [facility] [phone] office [phone] cell  Patient aware to avoid caffeine 12 hours prior to his cardiac PET scan.   

## 2022-11-23 ENCOUNTER — Ambulatory Visit (HOSPITAL_COMMUNITY)
Admission: RE | Admit: 2022-11-23 | Discharge: 2022-11-23 | Disposition: A | Discharge status: Home / Self Care | Payer: Medicare Other | Source: Ambulatory Visit | Attending: Cardiology | Admitting: Cardiology

## 2022-11-23 DIAGNOSIS — I5022 Chronic systolic (congestive) heart failure: Secondary | ICD-10-CM | POA: Insufficient documentation

## 2022-11-23 DIAGNOSIS — I493 Ventricular premature depolarization: Secondary | ICD-10-CM | POA: Diagnosis not present

## 2022-11-23 LAB — NM PET CT CARDIAC PERFUSION MULTI W/ABSOLUTE BLOODFLOW
LV dias vol: 123 mL (ref 62–150)
MBFR: 2.8
Nuc Rest EF: 44 %
Nuc Stress EF: 51 %
Peak HR: 81 {beats}/min
Rest HR: 56 {beats}/min
Rest MBF: 0.5 ml/g/min
Rest Nuclear Isotope Dose: 18.7 mCi
Rest perfusion cavity size (mL): 123 mL
ST Depression (mm): 0 mm
Stress MBF: 1.4 ml/g/min
Stress Nuclear Isotope Dose: 18.7 mCi
Stress perfusion cavity size (mL): 134 mL
TID: 1.09

## 2022-11-23 MED ORDER — RUBIDIUM RB82 GENERATOR (RUBYFILL)
18.7000 | PACK | Freq: Once | INTRAVENOUS | Status: AC
  Administered 2022-11-23: 18.7 via INTRAVENOUS

## 2022-11-23 MED ORDER — REGADENOSON 0.4 MG/5ML IV SOLN
0.4000 mg | Freq: Once | INTRAVENOUS | Status: AC
  Administered 2022-11-23: 0.4 mg via INTRAVENOUS

## 2022-11-23 MED ORDER — REGADENOSON 0.4 MG/5ML IV SOLN
INTRAVENOUS | Status: AC
  Filled 2022-11-23: qty 5

## 2022-11-25 ENCOUNTER — Other Ambulatory Visit (HOSPITAL_COMMUNITY): Payer: Self-pay | Admitting: Cardiology

## 2022-11-29 ENCOUNTER — Other Ambulatory Visit (HOSPITAL_COMMUNITY): Payer: Self-pay

## 2022-11-29 MED ORDER — TICAGRELOR 60 MG PO TABS: 60.0000 mg | ORAL_TABLET | Freq: Two times a day (BID) | ORAL | 11 refills | Status: DC

## 2022-12-06 DIAGNOSIS — I493 Ventricular premature depolarization: Secondary | ICD-10-CM | POA: Diagnosis not present

## 2022-12-10 ENCOUNTER — Telehealth: Payer: Self-pay | Admitting: *Deleted

## 2022-12-10 DIAGNOSIS — I493 Ventricular premature depolarization: Secondary | ICD-10-CM

## 2022-12-10 NOTE — Telephone Encounter (Signed)
-----   Message from Will Jorja Loa, MD sent at 12/08/2022  4:42 PM EDT ----- PVC burden increased.  If he is feeling poorly, will need a follow-up visit to discuss rhythm control.  If he is feeling well, would repeat his echo in 6 months.

## 2022-12-10 NOTE — Telephone Encounter (Signed)
Pt aware of findings. Pt reports feeling well with no issues at this time. He is agreeable to echo in [place] of November and follow up with Dr. Elberta Fortis end of November. Aware office will call to arrange both appointments. Patient verbalized understanding and agreeable to plan.

## 2022-12-13 ENCOUNTER — Encounter (HOSPITAL_COMMUNITY): Payer: Self-pay

## 2022-12-22 ENCOUNTER — Other Ambulatory Visit: Payer: Self-pay | Admitting: Internal Medicine

## 2022-12-25 ENCOUNTER — Other Ambulatory Visit (HOSPITAL_COMMUNITY): Payer: Self-pay | Admitting: Cardiology

## 2023-01-15 ENCOUNTER — Other Ambulatory Visit (HOSPITAL_COMMUNITY): Payer: Self-pay | Admitting: Cardiology

## 2023-01-24 ENCOUNTER — Encounter (HOSPITAL_COMMUNITY): Payer: Self-pay | Admitting: Cardiology

## 2023-01-24 ENCOUNTER — Telehealth (HOSPITAL_COMMUNITY): Payer: Self-pay

## 2023-01-24 MED ORDER — SPIRONOLACTONE 25 MG PO TABS: 25.0000 mg | ORAL_TABLET | Freq: Every day | ORAL | 3 refills | Status: DC

## 2023-01-24 NOTE — Telephone Encounter (Signed)
Meds ordered this encounter  Medications  . spironolactone (ALDACTONE) 25 MG tablet    Sig: Take 1 tablet (25 mg total) by mouth daily.    Dispense:  90 tablet    Refill:  3

## 2023-01-31 ENCOUNTER — Other Ambulatory Visit (HOSPITAL_COMMUNITY): Payer: Self-pay | Admitting: Cardiology

## 2023-02-21 ENCOUNTER — Other Ambulatory Visit: Payer: Self-pay | Admitting: Medical Genetics

## 2023-02-21 DIAGNOSIS — Z006 Encounter for examination for normal comparison and control in clinical research program: Secondary | ICD-10-CM

## 2023-03-20 ENCOUNTER — Other Ambulatory Visit (HOSPITAL_COMMUNITY): Payer: Self-pay | Admitting: Cardiology

## 2023-03-20 ENCOUNTER — Other Ambulatory Visit: Payer: Self-pay | Admitting: Internal Medicine

## 2023-03-24 ENCOUNTER — Encounter (HOSPITAL_COMMUNITY): Payer: Self-pay | Admitting: Cardiology

## 2023-03-24 ENCOUNTER — Ambulatory Visit (HOSPITAL_COMMUNITY)
Admission: RE | Admit: 2023-03-24 | Discharge: 2023-03-24 | Disposition: A | Discharge status: Home / Self Care | Payer: Medicare Other | Source: Ambulatory Visit | Attending: Cardiology | Admitting: Cardiology

## 2023-03-24 VITALS — BP 110/70 | HR 60 | Wt 161.4 lb

## 2023-03-24 DIAGNOSIS — I5022 Chronic systolic (congestive) heart failure: Secondary | ICD-10-CM | POA: Insufficient documentation

## 2023-03-24 LAB — BASIC METABOLIC PANEL
Anion gap: 8 (ref 5–15)
BUN: 15 mg/dL (ref 8–23)
CO2: 25 mmol/L (ref 22–32)
Calcium: 9.4 mg/dL (ref 8.9–10.3)
Chloride: 106 mmol/L (ref 98–111)
Creatinine, Ser: 0.92 mg/dL (ref 0.61–1.24)
GFR, Estimated: >60 mL/min (ref >60)
Glucose, Bld: 100 mg/dL — ABNORMAL HIGH (ref 70–99)
Potassium: 4.4 mmol/L (ref 3.5–5.1)
Sodium: 139 mmol/L (ref 135–145)

## 2023-03-24 LAB — LIPID PANEL
Cholesterol: 95 mg/dL (ref 0–200)
HDL: 52 mg/dL (ref >40)
LDL Cholesterol: 30 mg/dL (ref 0–99)
Total CHOL/HDL Ratio: 1.8 RATIO
Triglycerides: 63 mg/dL (ref <150)
VLDL: 13 mg/dL (ref 0–40)

## 2023-03-24 LAB — MAGNESIUM: Magnesium: 2 mg/dL (ref 1.7–2.4)

## 2023-03-24 NOTE — Patient Instructions (Signed)
Medication Changes:  No Changes In Medications at this time.   Lab Work:  Labs done today, your results will be available in MyChart, we will contact you for abnormal readings.   Follow-Up in: 3 months as scheduled   At the [facility], you and your health needs are our priority. We have a designated team specialized in the treatment of Heart Failure. This Care Team includes your primary Heart Failure Specialized Cardiologist (physician), Advanced Practice Providers (APPs- Physician Assistants and Nurse Practitioners), and Pharmacist who all work together to provide you with the care you need, when you need it.   You may see any of the following providers on your designated Care Team at your next follow up:  Dr. Arvilla Meres Dr. Marca Ancona Dr. Marcos Eke, NP Robbie Lis, [place] [facility] [place], [place] Brynda Peon, NP Karle Plumber, PharmD   Please be sure to bring in all your medications bottles to every appointment.   Need to Contact us:  If you have any questions or concerns before your next appointment please send [place] a message through [place] or call our office at [phone].    TO LEAVE A MESSAGE FOR THE NURSE SELECT OPTION 2, PLEASE LEAVE A MESSAGE INCLUDING: YOUR NAME DATE OF BIRTH CALL BACK NUMBER REASON FOR CALL**this is important as we prioritize the call backs  YOU WILL RECEIVE A CALL BACK THE SAME DAY AS LONG AS YOU CALL BEFORE 4:00 PM

## 2023-03-25 ENCOUNTER — Encounter: Payer: Self-pay | Admitting: Cardiology

## 2023-03-26 NOTE — Progress Notes (Signed)
[facility] Note    PCP: Kristian Covey, MD Cardiologist: Dr. Shirlee Latch   HPI: 67 y.o. with history of HTN, hyperlipidemia, GERD/hiatal hernia.  He is a nonsmoker, mother and 2 brothers had MIs in 1s. Now w/ systolic heart failure and CAD. His daughter, Marisue Ivan, works in [employer].  He had been dealing with peptic stricture and hiatal hernia for a while now; he saw Dr. Cliffton Asters and underwent robotic assisted laparoscopic paraesophageal hernia repair with fundoplication on 09/15/20.  Post-op, he was noted to have PVCs then developed chest pain with anterolateral ST elevation.  He was taken emergently for cath.  He was found to have acute occlusion of the proximal LAD.  This was treated with DES, and occluded D1 was treated with PTCA. LVEDP was around 26 mmHg.  After procedure, patient developed respiratory distress w/ lactic acidosis and AKI. Intubated and started on IV Lasix. Central line placed for co-ox and CVP monitoring. He was started on milrinone for low co-ox. Amiodarone started for PVC suppression. He responded well to therapy. Diuresed, extubated and able to wean off milrionone. PVCs well suppressed w/ amiodarone. GDMT initiated + DAPT w/ ASA + Brilinta for LAD stent. Amiodarone was discontinued and low dose ? blocker added. Discharged home w/ Lifevest.  Echo this admission showed EF 25-30%.   Repeat echo in 6/22 showed EF up to 55-60% with mild mid-apical anterior hypokinesis, normal RV.  Echo in 9/23 showed EF 55-60%, grade 2 diastolic dysfunction, apical hypokinesis.   Cardiac PET in 3/24 showed apical lateral/apical primarily fixed defect with EF 44% => prior MI with peri-infarct ischemia.   He has been noted to have frequent PVCs.  Mexiletine was started, but Zio monitor in 6/24 still showed 22.1% PVCs.   Patient returns for followup of CAD, ischemic cardiomyopathy, and PVCs.  SBP 90s-100s at home though he denies lightheadedness.  He does not want to increase  bisoprolol as he feels it tires him.  He denies exertional dyspnea or chest pain.  He plays racketball twice a week with no problems.  No orthopnea/PND.    ECG (personally reviewed): NSR, poor RWP  Labs (4/22): K 4.1, creatinine 1.09 Labs (6/22): K 3.6, creatinine 0.87, LDL 40, TGs 69 Labs (12/22): K 3.9, creatinine 0.95 Labs (3/23): LDL 39, HDL 39, K 3.6, creatinine 4.54 Labs (9/23): Lp(a) 19, hs-CRP 0.7 Labs (1/24): LDL 48, TGs 52, K 4.6, creatinine 0.94 Labs (3/24): K 3.7, creatinine 1.1, TSH normal  PMH: 1. Hyperlipidemia 2. GERD with hiatal hernia and peptic stricture.  S/p robotic assisted laparoscopic paraesophageal hernia repair with fundoplication on 09/15/20.  3. CAD: Acute anterolateral STEMI 3/22 post-op paraesophageal hernia repair. 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  DES to LAD and PTCA D1.  - Cardiac PET (3/24): Apical lateral/apical primarily fixed defect with EF 44% => prior MI with peri-infarct ischemia.  4. Chronic systolic CHF: Ischemic cardiomyopathy.  - Echo (3/22): EF 25-30%, LAD territory WMAs, normal RV, trivial MR.  - Echo (6/22): 55-60% with mild mid-apical anterior hypokinesis, normal RV. - Echo (9/23): EF 55-60%, grade 2 diastolic dysfunction, apical hypokinesis. 5. HTN 6. PVCs: Zio in 6/24 with 22.1% PVCs.    Current Outpatient Medications  Medication Sig Dispense Refill   aspirin 81 MG chewable tablet Chew 1 tablet (81 mg total) by mouth daily. 30 tablet 6   atorvastatin (LIPITOR) 80 MG tablet TAKE ONE TABLET BY MOUTH DAILY 90  tablet 0   BRILINTA 60 MG TABS tablet TAKE 1 TABLET BY MOUTH 2 TIMES A DAY 60 tablet 11   dapagliflozin propanediol (FARXIGA) 10 MG TABS tablet Take 1 tablet (10 mg total) by mouth daily. 90 tablet 3   ENTRESTO 24-26 MG TAKE 1 TABLET BY MOUTH 2 TIMES A DAY 180 tablet 1   metoprolol succinate (TOPROL XL) 25 MG 24 hr tablet Take 1 tablet (25 mg  total) by mouth daily. 90 tablet 3   mexiletine (MEXITIL) 150 MG capsule Take 1 capsule (150 mg total) by mouth 2 (two) times daily. 180 capsule 1   pantoprazole (PROTONIX) 40 MG tablet TAKE 1 TABLET BY MOUTH DAILY 90 tablet 1   sildenafil (VIAGRA) 100 MG tablet Take 1 tablet (100 mg total) by mouth daily as needed for erectile dysfunction. 10 tablet 0   spironolactone (ALDACTONE) 25 MG tablet TAKE 1 TABLET BY MOUTH EVERYDAY AT BEDTIME 90 tablet 2   No current facility-administered medications for this encounter.    Allergies  Allergen Reactions   Meperidine Hcl Nausea And Vomiting   Penicillins Other (See Comments)    REACTION: Childhood   Advil [Ibuprofen] Palpitations   Pseudoephedrine Hcl Palpitations      Social History   Socioeconomic History   Marital status: Married    Spouse name: Not on file   Number of children: Not on file   Years of education: Not on file   Highest education level: Not on file  Occupational History   Not on file  Tobacco Use   Smoking status: Never   Smokeless tobacco: Never  Vaping Use   Vaping status: Never Used  Substance and Sexual Activity   Alcohol use: No   Drug use: No   Sexual activity: Not on file  Other Topics Concern   Not on file  Social History Narrative   Occupation: Technical sales engineer   Married   Never Smoked   Alcohol use- no   Social Determinants of Corporate investment banker Strain: Not on file  Food Insecurity: Not on file  Transportation Needs: Not on file  Physical Activity: Not on file  Stress: Not on file  Social Connections: Not on file  Intimate Partner Violence: Not on file      Family History  Problem Relation Age of Onset   Heart disease Mother 71       CAD   Cancer Mother        lung   Hyperlipidemia Father    Stroke Father    Colon polyps Father    Diabetes Sister        type II   Heart attack Brother 48   Heart disease Brother 67       MI   Colon cancer Neg Hx    Esophageal cancer  Neg Hx    Pancreatic cancer Neg Hx    Stomach cancer Neg Hx    Rectal cancer Neg Hx     Vitals:   03/24/23 1510  BP: 110/70  Pulse: 60  SpO2: 98%  Weight: 73.2 kg (161 lb 6.4 oz)    PHYSICAL EXAM: General: NAD Neck: No JVD, no thyromegaly or thyroid nodule.  Lungs: Clear to auscultation bilaterally with normal respiratory effort. CV: Nondisplaced PMI.  Heart regular S1/S2, no S3/S4, no murmur.  No peripheral edema.  No carotid bruit.  Normal pedal pulses.  Abdomen: Soft, nontender, no hepatosplenomegaly, no distention.  Skin: Intact without lesions or rashes.  Neurologic: Alert  and oriented x 3.  Psych: Normal affect. Extremities: No clubbing or cyanosis.  HEENT: Normal.   ASSESSMENT & PLAN: 1.  CAD: Post-op anterolateral STEMI 09/15/20.  No prior CAD but history of HTN and hyperlipidemia and strong FH of CAD.  He had occluded proximal LAD and D1, now s/p DES to LAD and PTCA to D1.  Cardiac PET in 3/24 showed apical infarction with mild peri-infarct ischemia.  No chest pain.  - Continue ASA 81 daily and ticagrelor 60 mg bid.   - Continue atorvastatin 80 mg nightly. Check lipids today.  2. Systolic CHF: Ischemic cardiomyopathy. Echo in 3/22 with EF 25-30%, LAD territory WMAs with no evidence for mechanical MI complications.  Repeat echo in 6/22 with EF up to 55-60% with mid-apical anterior hypokinesis. Echo in 9/23 showed EF 55-60%, grade 2 diastolic dysfunction, apical hypokinesis.  However, cardiac PET in 3/24 showed EF 44%.  NYHA class I, not volume overloaded on exam.  I worry that heavy PVC burden may lead to fall in EF.  - Continue Entresto 24/26 bid.  BMET today.  - Continue spironolactone 25 mg  - Continue dapagliflozin 10 mg daily.   - Continue Toprol XL 25 mg daily, he does not want to increase this due to perceived fatigue.  - Repeat echo in 10/24.  3. PVCs: Not symptomatic. Low heart rate noted at home at times may be due to frequent/uncounted PVCs.  Zio in 6/24 with  22.1% PVCs despite use of mexiletine.  I worry that EF could drop over time with high PVC burden.  - Continue Toprol XL 25 mg daily. As above, he does not want to increase this.  - Check K, Mg. - Continue mexiletine.  - Would like to avoid amiodarone if possible.  - He will be seeing Dr. Elberta Fortis in a couple of weeks, he may need PVC ablation.   Followup in 3 months with APP   Marca Ancona, MD 03/26/23

## 2023-04-01 ENCOUNTER — Ambulatory Visit (HOSPITAL_COMMUNITY): Payer: Medicare Other | Attending: Cardiovascular Disease

## 2023-04-01 DIAGNOSIS — I509 Heart failure, unspecified: Secondary | ICD-10-CM

## 2023-04-01 DIAGNOSIS — E785 Hyperlipidemia, unspecified: Secondary | ICD-10-CM | POA: Insufficient documentation

## 2023-04-01 DIAGNOSIS — I493 Ventricular premature depolarization: Secondary | ICD-10-CM

## 2023-04-01 DIAGNOSIS — I252 Old myocardial infarction: Secondary | ICD-10-CM | POA: Insufficient documentation

## 2023-04-01 DIAGNOSIS — Z955 Presence of coronary angioplasty implant and graft: Secondary | ICD-10-CM | POA: Diagnosis not present

## 2023-04-01 DIAGNOSIS — Z8249 Family history of ischemic heart disease and other diseases of the circulatory system: Secondary | ICD-10-CM | POA: Insufficient documentation

## 2023-04-01 LAB — ECHOCARDIOGRAM COMPLETE
Area-P 1/2: 2.33 cm2
S' Lateral: 2.75 cm

## 2023-04-02 ENCOUNTER — Other Ambulatory Visit (HOSPITAL_COMMUNITY): Payer: Self-pay | Admitting: Cardiology

## 2023-05-14 ENCOUNTER — Other Ambulatory Visit (HOSPITAL_COMMUNITY): Payer: Self-pay | Admitting: Cardiology

## 2023-05-16 ENCOUNTER — Encounter: Payer: Self-pay | Admitting: Cardiology

## 2023-05-16 ENCOUNTER — Ambulatory Visit: Payer: Medicare Other | Attending: Cardiology | Admitting: Cardiology

## 2023-05-16 VITALS — BP 116/80 | HR 61 | Ht 65.75 in | Wt 163.0 lb

## 2023-05-16 DIAGNOSIS — I5022 Chronic systolic (congestive) heart failure: Secondary | ICD-10-CM

## 2023-05-16 DIAGNOSIS — I493 Ventricular premature depolarization: Secondary | ICD-10-CM | POA: Diagnosis not present

## 2023-05-16 DIAGNOSIS — I2510 Atherosclerotic heart disease of native coronary artery without angina pectoris: Secondary | ICD-10-CM

## 2023-05-16 NOTE — Progress Notes (Signed)
  Electrophysiology Office Note:   Date:  05/16/2023  ID:  Jerry Kramer, DOB Aug 01, 1955, MRN [id number]  Primary Cardiologist: Marca Ancona, MD Electrophysiologist: Regan Lemming, MD      History of Present Illness:   Jerry Kramer is a 67 y.o. male with h/o PVCs, hypertension, hyperlipidemia, coronary artery disease post STEMI with DES to the LAD, chronic systolic heart failure with recovered ejection fraction seen today for routine electrophysiology followup.   Since last being seen in our clinic the patient reports doing quite well.  He has no chest pain or shortness of breath.  He is able to do all of his daily activities.  He does not notice palpitations.  He is unaware of PVCs.  He can sometimes tell when his heart rate is slow, but otherwise feels well.  he denies chest pain, palpitations, dyspnea, PND, orthopnea, nausea, vomiting, dizziness, syncope, edema, weight gain, or early satiety.   Review of systems complete and found to be negative unless listed in HPI.   EP Information / Studies Reviewed:    EKG is ordered today. Personal review as below.        Risk Assessment/Calculations:              Physical Exam:   VS:  BP 116/80 (BP Location: Left Arm, Patient Position: Sitting, Cuff Size: Normal)   Pulse 61   Ht 5' 5.75" (1.67 m)   Wt 163 lb (73.9 kg)   SpO2 97%   BMI 26.51 kg/m    Wt Readings from Last 3 Encounters:  05/16/23 163 lb (73.9 kg)  03/24/23 161 lb 6.4 oz (73.2 kg)  11/05/22 159 lb (72.1 kg)     GEN: Well nourished, well developed in no acute distress NECK: No JVD; No carotid bruits CARDIAC: Regular rate and rhythm, no murmurs, rubs, gallops RESPIRATORY:  Clear to auscultation without rales, wheezing or rhonchi  ABDOMEN: Soft, non-tender, non-distended EXTREMITIES:  No edema; No deformity   ASSESSMENT AND PLAN:    1.  PVCs/nonsustained VT: Burden of 22%.  Currently on mexiletine.  He is minimally symptomatic.  Fortunately his ejection  fraction has remained stable.  As he is minimally symptomatic, he would prefer to continue with his current management.  If ejection fraction goes down, we [name] plan for alternative therapy.  Vonna Brabson repeat echo in 6 months.  2.  Coronary artery disease: Post anterolateral STEMI with DES to the LAD.  No current chest pain.  Plan per primary cardiology.  3.  Chronic systolic heart failure: Due to ischemic cardiomyopathy.  Ejection fraction is normalized.  Continue medical therapy per primary cardiology.  Follow up with Dr. Elberta Fortis in 6 months  Signed, Mercedez Boule Jorja Loa, MD

## 2023-05-16 NOTE — Patient Instructions (Addendum)
Medication Instructions:  Your physician recommends that you continue on your current medications as directed. Please refer to the Current Medication list given to you today.  *If you need a refill on your cardiac medications before your next appointment, please call your pharmacy*   Lab Work: None ordered    Testing/Procedures: Your physician has requested that you have an echocardiogram in 5 months. Echocardiography is a painless test that uses sound waves to create images of your heart. It provides your doctor with information about the size and shape of your heart and how well your heart's chambers and valves are working. This procedure takes approximately one hour. There are no restrictions for this procedure. Please do NOT wear cologne, perfume, aftershave, or lotions (deodorant is allowed). Please arrive 15 minutes prior to your appointment time.  Please note: We ask at that you not bring children with you during ultrasound (echo/ vascular) testing. Due to room size and safety concerns, children are not allowed in the ultrasound rooms during exams. Our front office staff cannot provide observation of children in our lobby area while testing is being conducted. An adult accompanying a patient to their appointment will only be allowed in the ultrasound room at the discretion of the ultrasound technician under special circumstances. We apologize for any inconvenience.    Follow-Up: At [facility], you and your health needs are our priority.  As part of our continuing mission to provide you with exceptional heart care, we have created designated Provider Care Teams.  These Care Teams include your primary Cardiologist (physician) and Advanced Practice Providers (APPs -  Physician Assistants and Nurse Practitioners) who all work together to provide you with the care you need, when you need it.  Your next appointment:   6 month(s)  The format for your next appointment:   In  Person  Provider:   Loman Brooklyn, MD{   Thank you for choosing [facility]!!   Dory Horn, RN [phone]

## 2023-06-04 ENCOUNTER — Other Ambulatory Visit (HOSPITAL_COMMUNITY): Payer: Self-pay | Admitting: Cardiology

## 2023-06-06 ENCOUNTER — Other Ambulatory Visit (HOSPITAL_COMMUNITY)
Admission: RE | Admit: 2023-06-06 | Discharge: 2023-06-06 | Disposition: A | Discharge status: Home / Self Care | Payer: Self-pay | Source: Ambulatory Visit | Attending: Medical Genetics | Admitting: Medical Genetics

## 2023-06-06 DIAGNOSIS — Z006 Encounter for examination for normal comparison and control in clinical research program: Secondary | ICD-10-CM | POA: Insufficient documentation

## 2023-06-08 ENCOUNTER — Encounter: Payer: Self-pay | Admitting: Family Medicine

## 2023-06-08 MED ORDER — SILDENAFIL CITRATE 100 MG PO TABS: 100.0000 mg | ORAL_TABLET | Freq: Every day | ORAL | 0 refills | Status: DC | PRN

## 2023-06-10 ENCOUNTER — Ambulatory Visit (HOSPITAL_COMMUNITY)
Admission: RE | Admit: 2023-06-10 | Discharge: 2023-06-10 | Disposition: A | Discharge status: Home / Self Care | Payer: Medicare Other | Source: Ambulatory Visit | Attending: Cardiology | Admitting: Cardiology

## 2023-06-10 ENCOUNTER — Encounter (HOSPITAL_COMMUNITY): Payer: Self-pay | Admitting: Cardiology

## 2023-06-10 VITALS — BP 110/68 | HR 55 | Wt 161.8 lb

## 2023-06-10 DIAGNOSIS — I110 Hypertensive heart disease with heart failure: Secondary | ICD-10-CM | POA: Diagnosis not present

## 2023-06-10 DIAGNOSIS — I5022 Chronic systolic (congestive) heart failure: Secondary | ICD-10-CM | POA: Insufficient documentation

## 2023-06-10 DIAGNOSIS — Z7902 Long term (current) use of antithrombotics/antiplatelets: Secondary | ICD-10-CM | POA: Diagnosis not present

## 2023-06-10 DIAGNOSIS — K449 Diaphragmatic hernia without obstruction or gangrene: Secondary | ICD-10-CM | POA: Diagnosis not present

## 2023-06-10 DIAGNOSIS — I2510 Atherosclerotic heart disease of native coronary artery without angina pectoris: Secondary | ICD-10-CM | POA: Insufficient documentation

## 2023-06-10 DIAGNOSIS — I493 Ventricular premature depolarization: Secondary | ICD-10-CM | POA: Insufficient documentation

## 2023-06-10 DIAGNOSIS — K219 Gastro-esophageal reflux disease without esophagitis: Secondary | ICD-10-CM | POA: Diagnosis not present

## 2023-06-10 DIAGNOSIS — Z8249 Family history of ischemic heart disease and other diseases of the circulatory system: Secondary | ICD-10-CM | POA: Diagnosis not present

## 2023-06-10 DIAGNOSIS — E785 Hyperlipidemia, unspecified: Secondary | ICD-10-CM | POA: Diagnosis not present

## 2023-06-10 DIAGNOSIS — I255 Ischemic cardiomyopathy: Secondary | ICD-10-CM | POA: Diagnosis not present

## 2023-06-10 DIAGNOSIS — Z7982 Long term (current) use of aspirin: Secondary | ICD-10-CM | POA: Insufficient documentation

## 2023-06-10 DIAGNOSIS — Z955 Presence of coronary angioplasty implant and graft: Secondary | ICD-10-CM | POA: Insufficient documentation

## 2023-06-10 DIAGNOSIS — I252 Old myocardial infarction: Secondary | ICD-10-CM | POA: Insufficient documentation

## 2023-06-10 DIAGNOSIS — Z79899 Other long term (current) drug therapy: Secondary | ICD-10-CM | POA: Diagnosis not present

## 2023-06-10 LAB — BASIC METABOLIC PANEL
Anion gap: 8 (ref 5–15)
BUN: 15 mg/dL (ref 8–23)
CO2: 25 mmol/L (ref 22–32)
Calcium: 9.2 mg/dL (ref 8.9–10.3)
Chloride: 105 mmol/L (ref 98–111)
Creatinine, Ser: 0.93 mg/dL (ref 0.61–1.24)
GFR, Estimated: >60 mL/min (ref >60)
Glucose, Bld: 92 mg/dL (ref 70–99)
Potassium: 4.3 mmol/L (ref 3.5–5.1)
Sodium: 138 mmol/L (ref 135–145)

## 2023-06-10 LAB — BRAIN NATRIURETIC PEPTIDE: B Natriuretic Peptide: 80.1 pg/mL (ref 0.0–100.0)

## 2023-06-10 NOTE — Patient Instructions (Signed)
Great to see you today!!!  Labs done today, your results will be available in MyChart, we will contact you for abnormal readings.  Do the following things EVERYDAY: Weigh yourself in the morning before breakfast. Write it down and keep it in a log. Take your medicines as prescribed Eat low salt foods--Limit salt (sodium) to 2000 mg per day.  Stay as active as you can everyday Limit all fluids for the day to less than 2 liters  Your physician recommends that you schedule a follow-up appointment in: 6 months (June 2025), **Please call our office in April to schedule this appointment  If you have any questions or concerns before your next appointment please send [place] a message through [place] or call our office at [phone].    TO LEAVE A MESSAGE FOR THE NURSE SELECT OPTION 2, PLEASE LEAVE A MESSAGE INCLUDING: YOUR NAME DATE OF BIRTH CALL BACK NUMBER REASON FOR CALL**this is important as we prioritize the call backs  YOU WILL RECEIVE A CALL BACK THE SAME DAY AS LONG AS YOU CALL BEFORE 4:00 PM

## 2023-06-11 NOTE — Progress Notes (Signed)
Advanced Heart Failure Clinic Note    PCP: Kristian Covey, MD Cardiologist: Dr. Shirlee Latch   HPI: 67 y.o. with history of HTN, hyperlipidemia, GERD/hiatal hernia.  He is a nonsmoker, mother and 2 brothers had MIs in 20s. Now w/ systolic heart failure and CAD. His daughter, Marisue Ivan, works in [employer] PACU.  He had been dealing with peptic stricture and hiatal hernia for a while now; he saw Dr. Cliffton Asters and underwent robotic assisted laparoscopic paraesophageal hernia repair with fundoplication on 09/15/20.  Post-op, he was noted to have PVCs then developed chest pain with anterolateral ST elevation.  He was taken emergently for cath.  He was found to have acute occlusion of the proximal LAD.  This was treated with DES, and occluded D1 was treated with PTCA. LVEDP was around 26 mmHg.  After procedure, patient developed respiratory distress w/ lactic acidosis and AKI. Intubated and started on IV Lasix. Central line placed for co-ox and CVP monitoring. He was started on milrinone for low co-ox. Amiodarone started for PVC suppression. He responded well to therapy. Diuresed, extubated and able to wean off milrionone. PVCs well suppressed w/ amiodarone. GDMT initiated + DAPT w/ ASA + Brilinta for LAD stent. Amiodarone was discontinued and low dose ? blocker added. Discharged home w/ Life[facility].  Echo this admission showed EF 25-30%.   Repeat echo in 6/22 showed EF up to 55-60% with mild mid-apical anterior hypokinesis, normal RV.  Echo in 9/23 showed EF 55-60%, grade 2 diastolic dysfunction, apical hypokinesis.   Cardiac PET in 3/24 showed apical lateral/apical primarily fixed defect with EF 44% => prior MI with peri-infarct ischemia.   He has been noted to have frequent PVCs.  Mexiletine was started, but Zio monitor in 6/24 still showed 22.1% PVCs.  Echo in 10/24 showed EF 50-55%, normal RV, normal IVC (not significantly different when compared to prior).   Patient returns for followup of CAD, ischemic  cardiomyopathy, and PVCs.  No palpitations.  Not having frequent PVCs on exam or ECG.  He still plays racketball 2 times a week.  No exertional dyspnea or chest pain.   Went on a [place] cruise in [place], had no problems. No lightheadedness or syncope.    ECG (personally reviewed): NSR, rate 51, no PVCs  Labs (4/22): K 4.1, creatinine 1.09 Labs (6/22): K 3.6, creatinine 0.87, LDL 40, TGs 69 Labs (12/22): K 3.9, creatinine 0.95 Labs (3/23): LDL 39, HDL 39, K 3.6, creatinine 1.61 Labs (9/23): Lp(a) 19, hs-CRP 0.7 Labs (1/24): LDL 48, TGs 52, K 4.6, creatinine 0.94 Labs (3/24): K 3.7, creatinine 1.1, TSH normal Labs (9/24): K 4.4, creatinine 0.92, LDL 30  PMH: 1. Hyperlipidemia 2. GERD with hiatal hernia and peptic stricture.  S/p robotic assisted laparoscopic paraesophageal hernia repair with fundoplication on 09/15/20.  3. CAD: Acute anterolateral STEMI 3/22 post-op paraesophageal hernia repair. 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  DES to LAD and PTCA D1.  - Cardiac PET (3/24): Apical lateral/apical primarily fixed defect with EF 44% => prior MI with peri-infarct ischemia.  4. Chronic systolic CHF: Ischemic cardiomyopathy.  - Echo (3/22): EF 25-30%, LAD territory WMAs, normal RV, trivial MR.  - Echo (6/22): 55-60% with mild mid-apical anterior hypokinesis, normal RV. - Echo (9/23): EF 55-60%, grade 2 diastolic dysfunction, apical hypokinesis. - Echo (10/24): EF 50-55%, normal RV, normal IVC (not significantly different when compared to prior) 5. HTN 6. PVCs: Zio in 6/24 with 22.1% PVCs.  Current Outpatient Medications  Medication Sig Dispense Refill   aspirin 81 MG chewable tablet Chew 1 tablet (81 mg total) by mouth daily. 30 tablet 6   atorvastatin (LIPITOR) 80 MG tablet TAKE 1 TABLET BY MOUTH DAILY 90 tablet 3   BRILINTA 60 MG TABS tablet TAKE 1 TABLET BY MOUTH 2 TIMES A DAY 60 tablet 11    ENTRESTO 24-26 MG TAKE 1 TABLET BY MOUTH 2 TIMES A DAY 180 tablet 1   FARXIGA 10 MG TABS tablet TAKE 1 TABLET BY MOUTH EVERY DAY 90 tablet 3   metoprolol succinate (TOPROL XL) 25 MG 24 hr tablet Take 1 tablet (25 mg total) by mouth daily. 90 tablet 3   mexiletine (MEXITIL) 150 MG capsule Take 1 capsule (150 mg total) by mouth 2 (two) times daily. 180 capsule 1   pantoprazole (PROTONIX) 40 MG tablet TAKE 1 TABLET BY MOUTH DAILY 90 tablet 1   sildenafil (VIAGRA) 100 MG tablet Take 1 tablet (100 mg total) by mouth daily as needed for erectile dysfunction. 10 tablet 0   spironolactone (ALDACTONE) 25 MG tablet TAKE 1 TABLET BY MOUTH EVERYDAY AT BEDTIME 90 tablet 2   No current facility-administered medications for this encounter.    Allergies  Allergen Reactions   Meperidine Hcl Nausea And Vomiting   Penicillins Other (See Comments)    REACTION: Childhood   Advil [Ibuprofen] Palpitations   Pseudoephedrine Hcl Palpitations      Social History   Socioeconomic History   Marital status: Married    Spouse name: Not on file   Number of children: Not on file   Years of education: Not on file   Highest education level: Not on file  Occupational History   Not on file  Tobacco Use   Smoking status: Never   Smokeless tobacco: Never  Vaping Use   Vaping status: Never Used  Substance and Sexual Activity   Alcohol use: No   Drug use: No   Sexual activity: Not on file  Other Topics Concern   Not on file  Social History Narrative   Occupation: Technical sales engineer   Married   Never Smoked   Alcohol use- no   Social Drivers of Corporate investment banker Strain: Not on file  Food Insecurity: Not on file  Transportation Needs: Not on file  Physical Activity: Not on file  Stress: Not on file  Social Connections: Not on file  Intimate Partner Violence: Not on file      Family History  Problem Relation Age of Onset   Heart disease Mother 66       CAD   Cancer Mother        lung    Hyperlipidemia Father    Stroke Father    Colon polyps Father    Diabetes Sister        type II   Heart attack Brother 48   Heart disease Brother 65       MI   Colon cancer Neg Hx    Esophageal cancer Neg Hx    Pancreatic cancer Neg Hx    Stomach cancer Neg Hx    Rectal cancer Neg Hx     Vitals:   06/10/23 1142  BP: 110/68  Pulse: (!) 55  SpO2: 96%  Weight: 73.4 kg (161 lb 12.8 oz)    PHYSICAL EXAM: General: NAD Neck: No JVD, no thyromegaly or thyroid nodule.  Lungs: Clear to auscultation bilaterally with normal respiratory effort. CV: Nondisplaced PMI.  Heart regular S1/S2, no S3/S4, no murmur.  No peripheral edema.  No carotid bruit.  Normal pedal pulses.  Abdomen: Soft, nontender, no hepatosplenomegaly, no distention.  Skin: Intact without lesions or rashes.  Neurologic: Alert and oriented x 3.  Psych: Normal affect. Extremities: No clubbing or cyanosis.  HEENT: Normal.   ASSESSMENT & PLAN: 1.  CAD: Post-op anterolateral STEMI 09/15/20.  No prior CAD but history of HTN and hyperlipidemia and strong FH of CAD.  He had occluded proximal LAD and D1, now s/p DES to LAD and PTCA to D1.  Cardiac PET in 3/24 showed apical infarction with mild peri-infarct ischemia.  No chest pain.  - Continue ASA 81 daily and ticagrelor 60 mg bid.   - Continue atorvastatin 80 mg nightly. Good LDL in 9/24.  2. Systolic CHF: Ischemic cardiomyopathy. Echo in 3/22 with EF 25-30%, LAD territory WMAs with no evidence for mechanical MI complications.  Repeat echo in 6/22 with EF up to 55-60% with mid-apical anterior hypokinesis. Echo in 9/23 showed EF 55-60%, grade 2 diastolic dysfunction, apical hypokinesis.  However, cardiac PET in 3/24 showed EF 44%.  Echo in 10/24 was stable, EF 50-55%, normal RV, normal IVC (not significantly different when compared to prior). NYHA class I, not volume overloaded on exam.  I have been worried that heavy PVC burden may lead to fall in EF. However, he does not seem to  have many PVCs today.  - Continue Entresto 24/26 bid.  BMET/BNP today.  - Continue spironolactone 25 mg  - Continue dapagliflozin 10 mg daily.   - Continue Toprol XL 25 mg daily, he does not want to increase this due to perceived fatigue.  - Repeat echo in 4/25 to make sure no fall in EF with PVCs.  3. PVCs: Not symptomatic. Low heart rate noted at home at times may be due to frequent/uncounted PVCs.  Zio in 6/24 with 22.1% PVCs despite use of mexiletine.  I worry that EF could drop over time with high PVC burden. He does not, however, seem to have many PVCs today and echo in 10/24 showed stable EF 50-55%.  - Continue Toprol XL 25 mg daily. As above, he does not want to increase this.  - Continue mexiletine.  - Would like to avoid amiodarone if possible.  - Following with Dr. Elberta Fortis, will get repeat echo in 4/25.   Followup in 6 months.   Marca Ancona, MD 06/11/23

## 2023-06-14 LAB — GENECONNECT MOLECULAR SCREEN: Genetic Analysis Overall Interpretation: NEGATIVE

## 2023-07-04 ENCOUNTER — Other Ambulatory Visit (HOSPITAL_COMMUNITY): Payer: Self-pay | Admitting: Cardiology

## 2023-07-11 ENCOUNTER — Ambulatory Visit (INDEPENDENT_AMBULATORY_CARE_PROVIDER_SITE_OTHER): Payer: Medicare Other

## 2023-07-11 VITALS — Ht 66.0 in | Wt 157.0 lb

## 2023-07-11 DIAGNOSIS — Z0000 Encounter for general adult medical examination without abnormal findings: Secondary | ICD-10-CM | POA: Diagnosis not present

## 2023-07-11 NOTE — Patient Instructions (Addendum)
 Jerry Kramer , Thank you for taking time to come for your Medicare Wellness Visit. I appreciate your ongoing commitment to your health goals. Please review the following plan we discussed and let me know if I can assist you in the future.   Referrals/Orders/Follow-Ups/Clinician Recommendations:   This is a list of the screening recommended for you and due dates:  Health Maintenance  Topic Date Due   Zoster (Shingles) Vaccine (1 of 2) Never done   Colon Cancer Screening  09/20/2022   COVID-19 Vaccine (5 - 2024-25 season) 02/27/2023   Hepatitis C Screening  12/03/2030*   Medicare Annual Wellness Visit  07/10/2024   DTaP/Tdap/Td vaccine (3 - Td or Tdap) 09/16/2026   Pneumonia Vaccine  Completed   Flu Shot  Completed   HPV Vaccine  Aged Out  *Topic was postponed. The date shown is not the original due date.    Advanced directives: (Copy Requested) Please bring a copy of your health care power of attorney and living will to the office to be added to your chart at your convenience.  Next Medicare Annual Wellness Visit scheduled for next year: Yes

## 2023-07-11 NOTE — Progress Notes (Signed)
 Subjective:   Jerry Kramer is a 68 y.o. male who presents for Medicare Annual/Subsequent preventive examination.  Visit Complete: Virtual I connected with  Jerry Kramer on 07/11/23 by a audio enabled telemedicine application and verified that I am speaking with the correct person using two identifiers.  Patient Location: Home  Provider Location: Home Office  I discussed the limitations of evaluation and management by telemedicine. The patient expressed understanding and agreed to proceed.  Vital Signs: Because this visit was a virtual/telehealth visit, some criteria may be missing or patient reported. Any vitals not documented were not able to be obtained and vitals that have been documented are patient reported.  Patient Medicare AWV questionnaire was completed by the patient on 07/07/23; I have confirmed that all information answered by patient is correct and no changes since this date.  Cardiac Risk Factors include: advanced age (>73men, >32 women);male gender     Objective:    Today's Vitals   07/11/23 0828  Weight: 157 lb (71.2 kg)  Height: 5' 6 (1.676 m)   Body mass index is 25.34 kg/m.     07/11/2023    8:35 AM 02/18/2022   11:46 AM 01/09/2022    8:01 PM 09/15/2020   11:00 PM 09/12/2020    3:14 PM 08/06/2020    8:35 AM 09/19/2017    7:16 AM  Advanced Directives  Does Patient Have a Medical Advance Directive? Yes Yes Yes Yes Yes Yes Yes  Type of Estate Agent of [place];Living will  Healthcare Power of [place];Living will Healthcare Power of [place];Living will Healthcare Power of [place];Living will Healthcare Power of [place] of [place];Living will;Out of facility DNR (pink MOST or yellow form)  Does patient want to make changes to medical advance directive?    No - Patient declined   No - Patient declined  Copy of Healthcare Power of Attorney in Chart? No - copy requested  No - copy requested   No - copy requested No - copy  requested    Current Medications (verified) Outpatient Encounter Medications as of 07/11/2023  Medication Sig   aspirin  81 MG chewable tablet Chew 1 tablet (81 mg total) by mouth daily.   atorvastatin  (LIPITOR ) 80 MG tablet TAKE 1 TABLET BY MOUTH DAILY   BRILINTA  60 MG TABS tablet TAKE 1 TABLET BY MOUTH 2 TIMES A DAY   ENTRESTO  24-26 MG TAKE 1 TABLET BY MOUTH 2 TIMES A DAY   FARXIGA  10 MG TABS tablet TAKE 1 TABLET BY MOUTH EVERY DAY   metoprolol  succinate (TOPROL  XL) 25 MG 24 hr tablet Take 1 tablet (25 mg total) by mouth daily.   mexiletine (MEXITIL) 150 MG capsule TAKE 1 CAPSULE (150 MG TOTAL) BY MOUTH 2 (TWO) TIMES DAILY.   pantoprazole  (PROTONIX ) 40 MG tablet TAKE 1 TABLET BY MOUTH DAILY   sildenafil  (VIAGRA ) 100 MG tablet Take 1 tablet (100 mg total) by mouth daily as needed for erectile dysfunction.   spironolactone  (ALDACTONE ) 25 MG tablet TAKE 1 TABLET BY MOUTH EVERYDAY AT BEDTIME   No facility-administered encounter medications on file as of 07/11/2023.    Allergies (verified) Meperidine hcl, Penicillins, Advil [ibuprofen], and Pseudoephedrine hcl   History: Past Medical History:  Diagnosis Date   Allergy    CHF (congestive heart failure) (HCC)    Colon polyps    Complication of anesthesia    hard to wake up   CONTACT DERMATITIS 09/03/2009   ELEVATED BLOOD PRESSURE 06/19/2010   HYPERLIPIDEMIA  08/27/2009   UNSPECIFIED OTALGIA 06/19/2010   Past Surgical History:  Procedure Laterality Date   APPENDECTOMY  1983   BALLOON DILATION N/A 02/18/2022   Procedure: BALLOON DILATION;  Surgeon: Albertus Gordy HERO, MD;  Location: [facility] ENDOSCOPY;  Service: Gastroenterology;  Laterality: N/A;   BIOPSY  08/06/2020   Procedure: BIOPSY;  Surgeon: San Sandor GAILS, DO;  Location: [facility] ENDOSCOPY;  Service: Gastroenterology;;  esophageal manometry probe  placement   CORONARY/GRAFT ACUTE MI REVASCULARIZATION N/A 09/15/2020   Procedure: Coronary/Graft Acute MI Revascularization;  Surgeon: Anner Alm ORN, MD;  Location: [facility] INVASIVE CV LAB;  Service: Cardiovascular;  Laterality: N/A;   ESOPHAGEAL MANOMETRY N/A 08/06/2020   Procedure: ESOPHAGEAL MANOMETRY (EM);  Surgeon: San Sandor GAILS, DO;  Location: [facility] ENDOSCOPY;  Service: Gastroenterology;  Laterality: N/A;   ESOPHAGOGASTRODUODENOSCOPY N/A 09/15/2020   Procedure: ESOPHAGOGASTRODUODENOSCOPY (EGD);  Surgeon: Shyrl Linnie KIDD, MD;  Location: [facility] OR;  Service: Thoracic;  Laterality: N/A;   ESOPHAGOGASTRODUODENOSCOPY (EGD) WITH PROPOFOL  N/A 08/06/2020   Procedure: ESOPHAGOGASTRODUODENOSCOPY (EGD) WITH PROPOFOL ;  Surgeon: San Sandor GAILS, DO;  Location: [facility] ENDOSCOPY;  Service: Gastroenterology;  Laterality: N/A;   ESOPHAGOGASTRODUODENOSCOPY (EGD) WITH PROPOFOL  N/A 01/09/2022   Procedure: ESOPHAGOGASTRODUODENOSCOPY (EGD) WITH PROPOFOL ;  Surgeon: Rollin Dover, MD;  Location: [facility] ENDOSCOPY;  Service: Gastroenterology;  Laterality: N/A;   ESOPHAGOGASTRODUODENOSCOPY (EGD) WITH PROPOFOL  N/A 02/18/2022   Procedure: ESOPHAGOGASTRODUODENOSCOPY (EGD) WITH PROPOFOL ;  Surgeon: Albertus Gordy HERO, MD;  Location: [facility] ENDOSCOPY;  Service: Gastroenterology;  Laterality: N/A;   EYE MUSCLE SURGERY Right    LEFT HEART CATH AND CORONARY ANGIOGRAPHY N/A 09/15/2020   Procedure: LEFT HEART CATH AND CORONARY ANGIOGRAPHY;  Surgeon: Anner Alm ORN, MD;  Location: [facility] INVASIVE CV LAB;  Service: Cardiovascular;  Laterality: N/A;   MIDDLE EAR SURGERY     ROTATOR CUFF REPAIR Left    TONSILLECTOMY  1965   XI ROBOTIC ASSISTED HIATAL HERNIA REPAIR N/A 09/15/2020   Procedure: XI ROBOTIC ASSISTED HIATAL HERNIA REPAIR WITH MESH;  Surgeon: Shyrl Linnie KIDD, MD;  Location: [facility] OR;  Service: Thoracic;  Laterality: N/A;   Family History  Problem Relation Age of Onset   Heart disease Mother 69       CAD   Cancer Mother        lung   Hyperlipidemia Father    Stroke Father    Colon polyps Father    Diabetes Sister        type II   Heart attack Brother 48   Heart disease Brother 19        MI   Colon cancer Neg Hx    Esophageal cancer Neg Hx    Pancreatic cancer Neg Hx    Stomach cancer Neg Hx    Rectal cancer Neg Hx    Social History   Socioeconomic History   Marital status: Married    Spouse name: Not on file   Number of children: Not on file   Years of education: Not on file   Highest education level: Not on file  Occupational History   Not on file  Tobacco Use   Smoking status: Never   Smokeless tobacco: Never  Vaping Use   Vaping status: Never Used  Substance and Sexual Activity   Alcohol use: No   Drug use: No   Sexual activity: Not on file  Other Topics Concern   Not on file  Social History Narrative   Occupation: Technical Sales Engineer   Married   Never Smoked   Alcohol use-  no   Social Drivers of Corporate Investment Banker Strain: Low Risk  (07/11/2023)   Overall Financial Resource Strain (CARDIA)    Difficulty of Paying Living Expenses: Not hard at all  Food Insecurity: No Food Insecurity (07/11/2023)   Hunger Vital Sign    Worried About Running Out of Food in the Last Year: Never true    Ran Out of Food in the Last Year: Never true  Transportation Needs: No Transportation Needs (07/11/2023)   PRAPARE - Administrator, Civil Service (Medical): No    Lack of Transportation (Non-Medical): No  Physical Activity: Sufficiently Active (07/11/2023)   Exercise Vital Sign    Days of Exercise per Week: 4 days    Minutes of Exercise per Session: 150+ min  Stress: No Stress Concern Present (07/11/2023)   [employer] of Occupational Health - Occupational Stress Questionnaire    Feeling of Stress : Not at all  Social Connections: Socially Integrated (07/11/2023)   Social Connection and Isolation Panel [NHANES]    Frequency of Communication with Friends and Family: More than three times a week    Frequency of Social Gatherings with Friends and Family: More than three times a week    Attends Religious Services: More than 4 times per  year    Active Member of [employer] or Organizations: Yes    Attends Engineer, Structural: More than 4 times per year    Marital Status: Married    Tobacco Counseling Counseling given: Not Answered   Clinical Intake:  Pre-visit preparation completed: Yes  Pain : No/denies pain     Nutritional Risks: None Diabetes: No  How often do you need to have someone help you when you read instructions, pamphlets, or other written materials from your doctor or pharmacy?: 1 - Never  Interpreter Needed?: No  Information entered by :: Rojelio Blush LPN   Activities of Daily Living    07/11/2023    8:34 AM 07/07/2023    8:37 AM  In your present state of health, do you have any difficulty performing the following activities:  Hearing? 0 0  Vision? 0 0  Difficulty concentrating or making decisions? 0 0  Walking or climbing stairs? 0 0  Dressing or bathing? 0 0  Doing errands, shopping? 0 0  Preparing Food and eating ? N N  Using the Toilet? N N  In the past six months, have you accidently leaked urine? N N  Do you have problems with loss of bowel control? N N  Managing your Medications? N N  Managing your Finances? N N  Housekeeping or managing your Housekeeping? N N    Patient Care Team: Micheal Wolm ORN, MD as PCP - General Inocencio, Soyla Lunger, MD as PCP - Electrophysiology (Cardiology) Rolan Ezra RAMAN, MD as PCP - Cardiology (Cardiology)  Indicate any recent Medical Services you may have received from other than [facility] providers in the past year (date may be approximate).     Assessment:   This is a routine wellness examination for Jerry Kramer.  Hearing/Vision screen Hearing Screening - Comments:: Denies hearing difficulties   Vision Screening - Comments:: Wears reading glasses - up to date with routine eye exams with [facility]    Goals Addressed               This Visit's Progress     Increase physical activity (pt-stated)        Stay Active  Depression Screen    07/11/2023    8:33 AM 07/21/2022    7:59 AM 09/15/2016    6:58 AM  PHQ 2/9 Scores  PHQ - 2 Score 0 0 0    Fall Risk    07/11/2023    8:34 AM 07/07/2023    8:37 AM 07/21/2022    7:59 AM 09/15/2016    6:58 AM  Fall Risk   Falls in the past year? 0 0 0 No  Number falls in past yr: 0  0   Injury with Fall? 0 0 0   Risk for fall due to : No Fall Risks  No Fall Risks   Follow up Falls prevention discussed  Falls evaluation completed     MEDICARE RISK AT HOME: Medicare Risk at Home Any stairs in or around the home?: (Patient-Rptd) No If so, are there any without handrails?: (Patient-Rptd) No Home free of loose throw rugs in walkways, pet beds, electrical cords, etc?: (Patient-Rptd) Yes Adequate lighting in your home to reduce risk of falls?: (Patient-Rptd) Yes Life alert?: (Patient-Rptd) No Use of a cane, walker or w/c?: (Patient-Rptd) No Grab bars in the bathroom?: (Patient-Rptd) No Shower chair or bench in shower?: (Patient-Rptd) Yes Elevated toilet seat or a handicapped toilet?: (Patient-Rptd) Yes  TIMED UP AND GO:  Was the test performed?  No    Cognitive Function:        07/11/2023    8:35 AM  6CIT Screen  What Year? 0 points  What month? 0 points  What time? 0 points  Count back from 20 0 points  Months in reverse 0 points  Repeat phrase 0 points  Total Score 0 points    Immunizations Immunization History  Administered Date(s) Administered   Fluad Quad(high Dose 65+) 04/09/2022   Influenza Split 03/21/2012, 03/20/2013, 04/19/2014   Influenza-Unspecified 03/20/2015, 03/09/2016, 03/16/2018, 05/16/2021, 02/27/2023   PFIZER(Purple Top)SARS-COV-2 Vaccination 07/13/2019, 08/03/2019, 03/28/2020   PNEUMOCOCCAL CONJUGATE-20 07/21/2022   Pfizer Covid-19 Vaccine Bivalent Booster 68yrs & up 03/18/2021   Td 03/24/2007   Tdap 09/15/2016    TDAP status: Up to date  Flu Vaccine status: Up to date  Pneumococcal vaccine status: Up to  date  Covid-19 vaccine status: Declined, Education has been provided regarding the importance of this vaccine but patient still declined. Advised may receive this vaccine at local pharmacy or Health Dept.or vaccine clinic. Aware to provide a copy of the vaccination record if obtained from local pharmacy or Health Dept. Verbalized acceptance and understanding.  Qualifies for Shingles Vaccine? Yes   Zostavax completed No   Shingrix Completed?: No.    Education has been provided regarding the importance of this vaccine. Patient has been advised to call insurance company to determine out of pocket expense if they have not yet received this vaccine. Advised may also receive vaccine at local pharmacy or Health Dept. Verbalized acceptance and understanding.  Screening Tests Health Maintenance  Topic Date Due   Zoster Vaccines- Shingrix (1 of 2) Never done   Colonoscopy  09/20/2022   COVID-19 Vaccine (5 - 2024-25 season) 02/27/2023   Hepatitis C Screening  12/03/2030 (Originally 02/22/1974)   Medicare Annual Wellness (AWV)  07/10/2024   DTaP/Tdap/Td (3 - Td or Tdap) 09/16/2026   Pneumonia Vaccine 21+ Years old  Completed   INFLUENZA VACCINE  Completed   HPV VACCINES  Aged Out    Health Maintenance  Health Maintenance Due  Topic Date Due   Zoster Vaccines- Shingrix (1 of 2) Never done  Colonoscopy  09/20/2022   COVID-19 Vaccine (5 - 2024-25 season) 02/27/2023    Colorectal cancer screening: Referral to GI placed Scheduled for 09/20/23. Pt aware the office will call re: appt.     Additional Screening:  Hepatitis C Screening: does qualify; Completed Deferred  Vision Screening: Recommended annual ophthalmology exams for early detection of glaucoma and other disorders of the eye. Is the patient up to date with their annual eye exam?  Yes  Who is the provider or what is the name of the office in which the patient attends annual eye exams? [facility] If pt is not established with a  provider, would they like to be referred to a provider to establish care? No .   Dental Screening: Recommended annual dental exams for proper oral hygiene    Community Resource Referral / Chronic Care Management:  CRR required this visit?  No   CCM required this visit?  No     Plan:     I have personally reviewed and noted the following in the patient's chart:   Medical and social history Use of alcohol, tobacco or illicit drugs  Current medications and supplements including opioid prescriptions. Patient is not currently taking opioid prescriptions. Functional ability and status Nutritional status Physical activity Advanced directives List of other physicians Hospitalizations, surgeries, and ER visits in previous 12 months Vitals Screenings to include cognitive, depression, and falls Referrals and appointments  In addition, I have reviewed and discussed with patient certain preventive protocols, quality metrics, and best practice recommendations. A written personalized care plan for preventive services as well as general preventive health recommendations were provided to patient.     Rojelio LELON Blush, LPN   8/86/7974   After Visit Summary: (MyChart) Due to this being a telephonic visit, the after visit summary with patients personalized plan was offered to patient via MyChart   Nurse Notes: None

## 2023-08-11 ENCOUNTER — Encounter: Payer: Self-pay | Admitting: Family Medicine

## 2023-08-11 DIAGNOSIS — M25569 Pain in unspecified knee: Secondary | ICD-10-CM

## 2023-08-31 ENCOUNTER — Other Ambulatory Visit: Payer: Self-pay | Admitting: Internal Medicine

## 2023-09-07 ENCOUNTER — Telehealth: Payer: Self-pay | Admitting: Internal Medicine

## 2023-09-07 DIAGNOSIS — M25561 Pain in right knee: Secondary | ICD-10-CM | POA: Diagnosis not present

## 2023-09-07 NOTE — Telephone Encounter (Signed)
 See note below from pt. He would like to schedule endo/colon direct at the hospital. Please advise if ok for direct or if he needs an OV first. He is flagged as a difficult airway.

## 2023-09-07 NOTE — Telephone Encounter (Signed)
 We will need clearance for Brilinta  Ok to book now to hold slot, but should see APP 1st to get appropriate clearance in setting of chronic antiplt therapy JMP

## 2023-09-07 NOTE — Telephone Encounter (Signed)
 Inbound call from patient, wishing to schedule an endo colon procedure with Dr. Rhea Belton. Patient states he was recommended to schedule both procedures for another dilation for his hiatal hernia. Patient would like to schedule procedure directly instead of being seen in the office. Patient requested a message sent to Dr. Lauro Franklin nurse to see if this can be done.   Please advise.

## 2023-09-18 ENCOUNTER — Other Ambulatory Visit (HOSPITAL_COMMUNITY): Payer: Self-pay | Admitting: Cardiology

## 2023-09-18 ENCOUNTER — Other Ambulatory Visit: Payer: Self-pay | Admitting: Internal Medicine

## 2023-09-18 DIAGNOSIS — I493 Ventricular premature depolarization: Secondary | ICD-10-CM

## 2023-09-18 DIAGNOSIS — I5022 Chronic systolic (congestive) heart failure: Secondary | ICD-10-CM

## 2023-09-26 NOTE — Addendum Note (Signed)
 Addended by: Baird Lyons on: 09/26/2023 03:48 PM   Modules accepted: Orders

## 2023-09-27 ENCOUNTER — Ambulatory Visit (HOSPITAL_COMMUNITY): Payer: Medicare Other | Attending: Cardiology

## 2023-09-27 ENCOUNTER — Other Ambulatory Visit: Payer: Self-pay | Admitting: Cardiology

## 2023-09-27 DIAGNOSIS — I2510 Atherosclerotic heart disease of native coronary artery without angina pectoris: Secondary | ICD-10-CM

## 2023-09-27 DIAGNOSIS — I493 Ventricular premature depolarization: Secondary | ICD-10-CM

## 2023-09-27 DIAGNOSIS — I340 Nonrheumatic mitral (valve) insufficiency: Secondary | ICD-10-CM

## 2023-09-27 DIAGNOSIS — I5022 Chronic systolic (congestive) heart failure: Secondary | ICD-10-CM

## 2023-09-27 LAB — ECHOCARDIOGRAM COMPLETE
Area-P 1/2: 2.83 cm2
S' Lateral: 3.2 cm

## 2023-11-18 ENCOUNTER — Ambulatory Visit (HOSPITAL_COMMUNITY): Payer: Self-pay | Admitting: Cardiology

## 2023-11-18 ENCOUNTER — Ambulatory Visit (HOSPITAL_COMMUNITY)
Admission: RE | Admit: 2023-11-18 | Discharge: 2023-11-18 | Disposition: A | Discharge status: Home / Self Care | Source: Ambulatory Visit | Attending: Cardiology | Admitting: Cardiology

## 2023-11-18 ENCOUNTER — Encounter (HOSPITAL_COMMUNITY): Payer: Self-pay | Admitting: Cardiology

## 2023-11-18 VITALS — BP 104/60 | HR 61 | Wt 165.0 lb

## 2023-11-18 DIAGNOSIS — I110 Hypertensive heart disease with heart failure: Secondary | ICD-10-CM | POA: Insufficient documentation

## 2023-11-18 DIAGNOSIS — I493 Ventricular premature depolarization: Secondary | ICD-10-CM | POA: Diagnosis not present

## 2023-11-18 DIAGNOSIS — I252 Old myocardial infarction: Secondary | ICD-10-CM | POA: Insufficient documentation

## 2023-11-18 DIAGNOSIS — Z7902 Long term (current) use of antithrombotics/antiplatelets: Secondary | ICD-10-CM | POA: Diagnosis not present

## 2023-11-18 DIAGNOSIS — I2510 Atherosclerotic heart disease of native coronary artery without angina pectoris: Secondary | ICD-10-CM | POA: Insufficient documentation

## 2023-11-18 DIAGNOSIS — Z7982 Long term (current) use of aspirin: Secondary | ICD-10-CM | POA: Diagnosis not present

## 2023-11-18 DIAGNOSIS — E785 Hyperlipidemia, unspecified: Secondary | ICD-10-CM | POA: Diagnosis not present

## 2023-11-18 DIAGNOSIS — Z9889 Other specified postprocedural states: Secondary | ICD-10-CM | POA: Diagnosis not present

## 2023-11-18 DIAGNOSIS — Z955 Presence of coronary angioplasty implant and graft: Secondary | ICD-10-CM | POA: Insufficient documentation

## 2023-11-18 DIAGNOSIS — Z79899 Other long term (current) drug therapy: Secondary | ICD-10-CM | POA: Diagnosis not present

## 2023-11-18 DIAGNOSIS — R5383 Other fatigue: Secondary | ICD-10-CM | POA: Diagnosis not present

## 2023-11-18 DIAGNOSIS — I5022 Chronic systolic (congestive) heart failure: Secondary | ICD-10-CM | POA: Diagnosis not present

## 2023-11-18 DIAGNOSIS — Z8249 Family history of ischemic heart disease and other diseases of the circulatory system: Secondary | ICD-10-CM | POA: Diagnosis not present

## 2023-11-18 LAB — BASIC METABOLIC PANEL WITH GFR
Anion gap: 6 (ref 5–15)
BUN: 14 mg/dL (ref 8–23)
CO2: 24 mmol/L (ref 22–32)
Calcium: 9.3 mg/dL (ref 8.9–10.3)
Chloride: 109 mmol/L (ref 98–111)
Creatinine, Ser: 0.88 mg/dL (ref 0.61–1.24)
GFR, Estimated: >60 mL/min (ref >60)
Glucose, Bld: 73 mg/dL (ref 70–99)
Potassium: 3.7 mmol/L (ref 3.5–5.1)
Sodium: 139 mmol/L (ref 135–145)

## 2023-11-18 LAB — LIPID PANEL
Cholesterol: 94 mg/dL (ref 0–200)
HDL: 47 mg/dL (ref >40)
LDL Cholesterol: 41 mg/dL (ref 0–99)
Total CHOL/HDL Ratio: 2 ratio
Triglycerides: 31 mg/dL (ref <150)
VLDL: 6 mg/dL (ref 0–40)

## 2023-11-18 NOTE — Patient Instructions (Signed)
 There has been no changes to your medications.  Labs done today, your results will be available in MyChart, we will contact you for abnormal readings.  Your physician recommends that you schedule a follow-up appointment in: 6 months. ( October) ** PLEASE CALL THE OFFICE IN AUGUST TO ARRANGE YOUR FOLLOW UP APPOINTMENT.**  If you have any questions or concerns before your next appointment please send [place] a message through [place] or call our office at [phone].    TO LEAVE A MESSAGE FOR THE NURSE SELECT OPTION 2, PLEASE LEAVE A MESSAGE INCLUDING: YOUR NAME DATE OF BIRTH CALL BACK NUMBER REASON FOR CALL**this is important as we prioritize the call backs  YOU WILL RECEIVE A CALL BACK THE SAME DAY AS LONG AS YOU CALL BEFORE 4:00 PM  At the [facility], you and your health needs are our priority. As part of our continuing mission to provide you with exceptional heart care, we have created designated Provider Care Teams. These Care Teams include your primary Cardiologist (physician) and Advanced Practice Providers (APPs- Physician Assistants and Nurse Practitioners) who all work together to provide you with the care you need, when you need it.   You may see any of the following providers on your designated Care Team at your next follow up: Dr Arvilla Meres Dr Marca Ancona Dr. Dorthula Nettles Dr. Clearnce Hasten Amy Filbert Schilder, NP Robbie Lis, [place] [facility] [place], [place] Brynda Peon, NP [place] Lee, NP Clarisa Kindred, NP Karle Plumber, PharmD Enos Fling, PharmD   Please be sure to bring in all your medications bottles to every appointment.    Thank you for choosing [facility]

## 2023-11-19 NOTE — Progress Notes (Signed)
 Advanced Heart Failure Clinic Note    PCP: Jerry Sit, MD Cardiologist: Dr. Mitzie Kramer   Chief complaint: PVCs  HPI: 68 y.o. with history of HTN, hyperlipidemia, GERD/hiatal hernia.  He is a nonsmoker, mother and 2 brothers had MIs in 51s. Now w/ systolic heart failure and CAD. His daughter, Jerry Kramer, works in [employer] PACU.  He had been dealing with peptic stricture and hiatal hernia for a while now; he saw Dr. Deloise Kramer and underwent robotic assisted laparoscopic paraesophageal hernia repair with fundoplication on 09/15/20.  Post-op, he was noted to have PVCs then developed chest pain with anterolateral ST elevation.  He was taken emergently for cath.  He was found to have acute occlusion of the proximal LAD.  This was treated with DES, and occluded D1 was treated with PTCA. LVEDP was around 26 mmHg.  After procedure, patient developed respiratory distress w/ lactic acidosis and AKI. Intubated and started on IV Lasix . Central line placed for co-ox and CVP monitoring. He was started on milrinone  for low co-ox. Amiodarone  started for PVC suppression. He responded well to therapy. Diuresed, extubated and able to wean off milrionone. PVCs well suppressed w/ amiodarone . GDMT initiated + DAPT w/ ASA + Brilinta  for LAD stent. Amiodarone  was discontinued and low dose ? blocker added. Discharged home w/ [facility].  Echo this admission showed EF 25-30%.   Repeat echo in 6/22 showed EF up to 55-60% with mild mid-apical anterior hypokinesis, normal RV.  Echo in 9/23 showed EF 55-60%, grade 2 diastolic dysfunction, apical hypokinesis.   Cardiac PET in 3/24 showed apical lateral/apical primarily fixed defect with EF 44% => prior MI with peri-infarct ischemia.   He has been noted to have frequent PVCs.  Mexiletine was started, but Zio monitor in 6/24 still showed 22.1% PVCs.  Echo in 10/24 showed EF 50-55%, normal RV, normal IVC (not significantly different when compared to prior).   Echo was done today and  reviewed, EF 55-60%, normal RV.   Patient returns for followup of CAD, ischemic cardiomyopathy, and PVCs.  He continues to play racketball twice a week.  No exertional dyspnea or chest pain with most activities. He can get a little short of breath carrying a heavy load.  No orthopnea/PND.  He feels palpitations sometimes lying in bed at night, attributes to PVCs. This tends to fluctuate.   ECG (personally reviewed): NSR, poor RWP, no PVCs  Labs (9/23): Lp(a) 19, hs-CRP 0.7 Labs (1/24): LDL 48, TGs 52, K 4.6, creatinine 0.94 Labs (3/24): K 3.7, creatinine 1.1, TSH normal Labs (9/24): K 4.4, creatinine 0.92, LDL 30 Labs (12/24): BNP 80, K 4.3, creatinine 0.93  PMH: 1. Hyperlipidemia 2. GERD with hiatal hernia and peptic stricture.  S/p robotic assisted laparoscopic paraesophageal hernia repair with fundoplication on 09/15/20.  3. CAD: Acute anterolateral STEMI 3/22 post-op paraesophageal hernia repair. 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  DES to LAD and PTCA D1.  - Cardiac PET (3/24): Apical lateral/apical primarily fixed defect with EF 44% => prior MI with peri-infarct ischemia.  4. Chronic systolic CHF: Ischemic cardiomyopathy.  - Echo (3/22): EF 25-30%, LAD territory WMAs, normal RV, trivial MR.  - Echo (6/22): 55-60% with mild mid-apical anterior hypokinesis, normal RV. - Echo (9/23): EF 55-60%, grade 2 diastolic dysfunction, apical hypokinesis. - Echo (10/24): EF 50-55%, normal RV, normal IVC (not significantly different when compared to prior) - Echo (5/25): EF 55-60%, normal RV. 5. HTN 6. PVCs: Zio  in 6/24 with 22.1% PVCs.    Current Outpatient Medications  Medication Sig Dispense Refill   aspirin  81 MG chewable tablet Chew 1 tablet (81 mg total) by mouth daily. 30 tablet 6   atorvastatin  (LIPITOR ) 80 MG tablet TAKE 1 TABLET BY MOUTH DAILY 90 tablet 3   BRILINTA  60 MG TABS tablet TAKE 1 TABLET BY  MOUTH 2 TIMES A DAY 60 tablet 11   ENTRESTO  24-26 MG TAKE 1 TABLET BY MOUTH 2 TIMES A DAY 180 tablet 1   FARXIGA  10 MG TABS tablet TAKE 1 TABLET BY MOUTH EVERY DAY 90 tablet 3   metoprolol  succinate (TOPROL -XL) 25 MG 24 hr tablet TAKE 1 TABLET BY MOUTH DAILY 90 tablet 3   mexiletine (MEXITIL) 150 MG capsule TAKE 1 CAPSULE (150 MG TOTAL) BY MOUTH 2 (TWO) TIMES DAILY. 180 capsule 1   pantoprazole  (PROTONIX ) 40 MG tablet TAKE 1 TABLET BY MOUTH DAILY 90 tablet 0   sildenafil  (VIAGRA ) 100 MG tablet Take 1 tablet (100 mg total) by mouth daily as needed for erectile dysfunction. 10 tablet 0   spironolactone  (ALDACTONE ) 25 MG tablet TAKE 1 TABLET BY MOUTH EVERYDAY AT BEDTIME 90 tablet 2   No current facility-administered medications for this encounter.    Allergies  Allergen Reactions   Meperidine Hcl Nausea And Vomiting   Penicillins Other (See Comments)    REACTION: Childhood   Advil [Ibuprofen] Palpitations   Pseudoephedrine Hcl Palpitations      Social History   Socioeconomic History   Marital status: Married    Spouse name: Not on file   Number of children: Not on file   Years of education: Not on file   Highest education level: Not on file  Occupational History   Not on file  Tobacco Use   Smoking status: Never   Smokeless tobacco: Never  Vaping Use   Vaping status: Never Used  Substance and Sexual Activity   Alcohol use: No   Drug use: No   Sexual activity: Not on file  Other Topics Concern   Not on file  Social History Narrative   Occupation: Technical sales engineer   Married   Never Smoked   Alcohol use- no   Social Drivers of Corporate investment banker Strain: Low Risk  (07/11/2023)   Overall Financial Resource Strain (CARDIA)    Difficulty of Paying Living Expenses: Not hard at all  Food Insecurity: No Food Insecurity (07/11/2023)   Hunger Vital Sign    Worried About Running Out of Food in the Last Year: Never true    Ran Out of Food in the Last Year: Never true   Transportation Needs: No Transportation Needs (07/11/2023)   PRAPARE - Administrator, Civil Service (Medical): No    Lack of Transportation (Non-Medical): No  Physical Activity: Sufficiently Active (07/11/2023)   Exercise Vital Sign    Days of Exercise per Week: 4 days    Minutes of Exercise per Session: 150+ min  Stress: No Stress Concern Present (07/11/2023)   [employer] of Occupational Health - Occupational Stress Questionnaire    Feeling of Stress : Not at all  Social Connections: Socially Integrated (07/11/2023)   Social Connection and Isolation Panel [NHANES]    Frequency of Communication with Friends and Family: More than three times a week    Frequency of Social Gatherings with Friends and Family: More than three times a week    Attends Religious Services: More than 4 times per year  Active Member of Clubs or Organizations: Yes    Attends Banker Meetings: More than 4 times per year    Marital Status: Married  Catering manager Violence: Not At Risk (07/11/2023)   Humiliation, Afraid, Rape, and Kick questionnaire    Fear of Current or Ex-Partner: No    Emotionally Abused: No    Physically Abused: No    Sexually Abused: No      Family History  Problem Relation Age of Onset   Heart disease Mother 34       CAD   Cancer Mother        lung   Hyperlipidemia Father    Stroke Father    Colon polyps Father    Diabetes Sister        type II   Heart attack Brother 48   Heart disease Brother 45       MI   Colon cancer Neg Hx    Esophageal cancer Neg Hx    Pancreatic cancer Neg Hx    Stomach cancer Neg Hx    Rectal cancer Neg Hx     Vitals:   11/18/23 0836  BP: 104/60  Pulse: 61  SpO2: 93%  Weight: 74.8 kg (165 lb)    PHYSICAL EXAM: General: NAD Neck: No JVD, no thyromegaly or thyroid  nodule.  Lungs: Clear to auscultation bilaterally with normal respiratory effort. CV: Nondisplaced PMI.  Heart regular S1/S2, no S3/S4, no murmur.   No peripheral edema.  No carotid bruit.  Normal pedal pulses.  Abdomen: Soft, nontender, no hepatosplenomegaly, no distention.  Skin: Intact without lesions or rashes.  Neurologic: Alert and oriented x 3.  Psych: Normal affect. Extremities: No clubbing or cyanosis.  HEENT: Normal.   ASSESSMENT & PLAN: 1.  CAD: Post-op anterolateral STEMI 09/15/20.  No prior CAD but history of HTN and hyperlipidemia and strong FH of CAD.  He had occluded proximal LAD and D1, now s/p DES to LAD and PTCA to D1.  Cardiac PET in 3/24 showed apical infarction with mild peri-infarct ischemia.  No chest pain.   - Continue ASA 81 daily and ticagrelor  60 mg bid.   - Continue atorvastatin  80 mg nightly. Check lipids and Lp(a).   2. Systolic CHF: Ischemic cardiomyopathy. Echo in 3/22 with EF 25-30%, LAD territory WMAs with no evidence for mechanical MI complications.  Repeat echo in 6/22 with EF up to 55-60% with mid-apical anterior hypokinesis. Echo in 9/23 showed EF 55-60%, grade 2 diastolic dysfunction, apical hypokinesis.  However, cardiac PET in 3/24 showed EF 44%.  Echo in 10/24 was stable, EF 50-55%, normal RV, normal IVC (not significantly different when compared to prior). Echo today showed EF 55-60%.  NYHA class I-II, not volume overloaded on exam.  I have been worried that heavy PVC burden may lead to fall in EF. However, he does not seem to have many PVCs today.  - Continue Entresto  24/26 bid.  BMET today.   - Continue spironolactone  25 mg  - Continue dapagliflozin  10 mg daily.   - Continue Toprol  XL 25 mg daily, he has not wanted to increase this due to perceived fatigue.  3. PVCs: Not significantly symptomatic. Low heart rate noted at home at times may be due to frequent/uncounted PVCs.  Zio in 6/24 with 22.1% PVCs despite use of mexiletine.  I worry that EF could drop over time with high PVC burden. He does not, however, seem to have many PVCs today and echo today showed stable EF  55-60%.  - Continue Toprol  XL 25  mg daily. As above, he does not want to increase this.  - Continue mexiletine.  - Would like to avoid amiodarone  if possible.  - He has seen EP.   Followup in 6 months.   I spent 32 minutes reviewing records, interviewing/examining patient, and managing orders.   Jerry Bourdon, MD 11/19/23

## 2023-11-22 LAB — LIPOPROTEIN A (LPA): Lipoprotein (a): 17.3 nmol/L (ref <75.0)

## 2023-11-29 ENCOUNTER — Telehealth: Payer: Self-pay | Admitting: *Deleted

## 2023-11-29 NOTE — Telephone Encounter (Signed)
 Request for surgical clearance:     Endoscopy Procedure  What type of surgery is being performed?     Endoscopy/Colonoscopy  When is this surgery scheduled?     TBD  What type of clearance is required ?   Pharmacy AND Medical/cardiac  Are there any medications that need to be held prior to surgery and how long? Brilinta  x 5 day  Practice name and name of physician performing surgery?      [facility]  What is your office phone and fax number?      Phone- [phone]  Fax- [phone]  Anesthesia type (None, local, MAC, general) ?       MAC  Please route your response to Rudi Corwin, RN  Thank you!!

## 2023-11-30 ENCOUNTER — Ambulatory Visit: Attending: Cardiology | Admitting: Cardiology

## 2023-11-30 ENCOUNTER — Encounter: Payer: Self-pay | Admitting: Cardiology

## 2023-11-30 VITALS — BP 110/64 | HR 72 | Ht 66.0 in | Wt 160.0 lb

## 2023-11-30 DIAGNOSIS — I5022 Chronic systolic (congestive) heart failure: Secondary | ICD-10-CM | POA: Diagnosis not present

## 2023-11-30 DIAGNOSIS — I2510 Atherosclerotic heart disease of native coronary artery without angina pectoris: Secondary | ICD-10-CM

## 2023-11-30 DIAGNOSIS — I493 Ventricular premature depolarization: Secondary | ICD-10-CM

## 2023-11-30 NOTE — Patient Instructions (Signed)
 Medication Instructions:  Your physician recommends that you continue on your current medications as directed. Please refer to the Current Medication list given to you today.  *If you need a refill on your cardiac medications before your next appointment, please call your pharmacy*  Lab Work: None ordered.  You may go to any [facility] Location for your lab work:  [facility] - [address] ([facility]) - [address] [phone] [address]  [place] - [address]   [place]  - [address]   [place] - [address] - [address] [employer]  - [address] [place] - [address] ([facility]   If you have labs (blood work) drawn today and your tests are completely normal, you will receive your results only by: [employer] (if you have MyChart)  If you have any lab test that is abnormal or we need to change your treatment, we will call you or send a MyChart message to review the results.  Testing/Procedures: None ordered.  Follow-Up: At [facility], you and your health needs are our priority.  As part of our continuing mission to provide you with exceptional heart care, we have created designated Provider Care Teams.  These Care Teams include your primary Cardiologist (physician) and Advanced Practice Providers (APPs -  Physician Assistants and Nurse Practitioners) who all work together to provide you with the care you need, when you need it.  Your next appointment:   1 year(s)  The format for your next appointment:   In Person  Provider:   Agatha Horsfall, MD{or one of the following Advanced Practice Providers on your designated Care Team:   Mertha Abrahams, [place] [address]" [place], [place] Neda Balk, NP

## 2023-11-30 NOTE — Telephone Encounter (Signed)
   Name: Jerry Kramer  DOB: 09-20-1955  MRN: [id number]  Primary Cardiologist: Peder Bourdon, MD  Chart reviewed as part of pre-operative protocol coverage. The patient has an upcoming visit scheduled with Dr. Lawana Pray on 11/30/2023 at which time clearance can be addressed in case there are any issues that would impact surgical recommendations. I added preop FYI to appointment note so that provider is aware to address at time of outpatient visit.  Per office protocol the cardiology provider should forward their finalized clearance decision and recommendations regarding antiplatelet therapy to the requesting party below.    Per previous recommendation patient can hold Brilinta  5 days prior to procedure and should resume postprocedure when surgically safe at the discretion of performing surgeon.   I will route this message as FYI to requesting party and remove this message from the preop box as separate preop APP input not needed at this time.   Please call with any questions.  Francene Ing, Retha Cast, NP  11/30/2023, 7:41 AM

## 2023-11-30 NOTE — Progress Notes (Signed)
  Electrophysiology Office Note:   Date:  11/30/2023  ID:  SAHARSH STERLING, DOB 07/25/1955, MRN [id number]  Primary Cardiologist: Peder Bourdon, MD Primary Heart Failure: None Electrophysiologist: Kiely Cousar Cortland Ding, MD      History of Present Illness:   Jerry Kramer is a 68 y.o. male with h/o PVCs, hypertension, hyperlipidemia, coronary disease post STEMI with DES to the LAD, chronic systolic heart failure with recovered ejection fraction seen today for routine electrophysiology followup.   Since last being seen in our clinic the patient reports doing well.  He has no chest pain or shortness of breath.  He is able to do his daily activities.  He does note continued PVCs.  PVCs have become more rare and occur at times every few weeks.  he denies chest pain, palpitations, dyspnea, PND, orthopnea, nausea, vomiting, dizziness, syncope, edema, weight gain, or early satiety.   Review of systems complete and found to be negative unless listed in HPI.   EP Information / Studies Reviewed:    EKG is not ordered today. EKG from 11/18/2023 reviewed which showed sinus rhythm, possible anterior infarct        Risk Assessment/Calculations:            Physical Exam:   VS:  There were no vitals taken for this visit.   Wt Readings from Last 3 Encounters:  11/18/23 165 lb (74.8 kg)  07/11/23 157 lb (71.2 kg)  06/10/23 161 lb 12.8 oz (73.4 kg)     GEN: Well nourished, well developed in no acute distress NECK: No JVD; No carotid bruits CARDIAC: Regular rate and rhythm, no murmurs, rubs, gallops RESPIRATORY:  Clear to auscultation without rales, wheezing or rhonchi  ABDOMEN: Soft, non-tender, non-distended EXTREMITIES:  No edema; No deformity   ASSESSMENT AND PLAN:    1.  PVCs: 22% burden.  Has been started on mexiletine.  Ejection fraction has remained stable.  He is feeling intermittent palpitations but otherwise happy with his control.  No changes.  2.  Coronary artery disease: Post STEMI with  DES to the LAD.  No current chest pain.  Plan per primary cardiology.  3.  Chronic systolic heart failure: Due to ischemic cardiomyopathy.  Ejection fraction has normalized.  Medical therapy per primary cardiology.  4.  Preprocedure evaluation: No acute symptoms.  Does have palpitations but does not have chest pain or shortness of breath.  He had his most recent catheterization in 2022.  He would be able to hold his Brilinta  for his colonoscopy/endoscopy.  No further cardiac testing is necessary at this time.  He would be at intermediate risk for an intermediate risk procedure.  Follow up with EP APP in 12 months  Signed, [place] Leinen Cortland Ding, MD

## 2023-12-06 ENCOUNTER — Other Ambulatory Visit (HOSPITAL_COMMUNITY): Payer: Self-pay | Admitting: Cardiology

## 2023-12-08 ENCOUNTER — Ambulatory Visit: Admitting: Nurse Practitioner

## 2023-12-08 ENCOUNTER — Encounter: Payer: Self-pay | Admitting: Nurse Practitioner

## 2023-12-08 VITALS — BP 96/60 | HR 67 | Ht 66.0 in | Wt 162.5 lb

## 2023-12-08 DIAGNOSIS — I429 Cardiomyopathy, unspecified: Secondary | ICD-10-CM

## 2023-12-08 DIAGNOSIS — I5022 Chronic systolic (congestive) heart failure: Secondary | ICD-10-CM | POA: Diagnosis not present

## 2023-12-08 DIAGNOSIS — Z860101 Personal history of adenomatous and serrated colon polyps: Secondary | ICD-10-CM | POA: Diagnosis not present

## 2023-12-08 DIAGNOSIS — K222 Esophageal obstruction: Secondary | ICD-10-CM

## 2023-12-08 DIAGNOSIS — R1310 Dysphagia, unspecified: Secondary | ICD-10-CM

## 2023-12-08 DIAGNOSIS — Z860100 Personal history of colon polyps, unspecified: Secondary | ICD-10-CM

## 2023-12-08 NOTE — Patient Instructions (Addendum)
 _______________________________________________________  If your blood pressure at your visit was 140/90 or greater, please contact your primary care physician to follow up on this.  _______________________________________________________  If you are age 67 or older, your body mass index should be between 23-30. Your Body mass index is 26.23 kg/m. If this is out of the aforementioned range listed, please consider follow up with your Primary Care Provider.  If you are age 75 or younger, your body mass index should be between 19-25. Your Body mass index is 26.23 kg/m. If this is out of the aformentioned range listed, please consider follow up with your Primary Care Provider.   ________________________________________________________  The [facility] providers would like to encourage you to use MYCHART to communicate with providers for non-urgent requests or questions.  Due to long hold times on the telephone, sending your provider a message by [facility] may be a faster and more efficient way to get a response.  Please allow 48 business hours for a response.  Please remember that this is for non-urgent requests.  _______________________________________________________  Please call and speak to the nurse in 3 weeks at [phone] if you haven't heard anything about a date for your EGD/Colonoscopy that needs to be scheduled at the hospital with Dr Bridgett Camps.  Thank you for trusting me with your gastrointestinal care!   Everett Hitt, CRNP

## 2023-12-08 NOTE — Progress Notes (Signed)
 12/08/2023 IRIE FIORELLO [id number] December 03, 1955   CHIEF COMPLAINT: Schedule an EGD and colonoscopy  HISTORY OF PRESENT ILLNESS:[name] is a 68 year old male with a past medical history of GERD, recurrent dysphagia secondary to a distal esophageal stricture s/p esophageal dilatation 12/2019, food bolus impaction 12/2021, paraesophageal hernia s/p robotic assisted laparoscopic paraesophageal hernia repair with fundoplication on 09/15/20 complicated by a post op MI s/p DES to the LAD and PTCA to D1 on Brilinta  and CHF (LVEF 55 - 60% per ECHO 09/27/2023). He is followed by Dr. Bridgett Camps.  He presents today to schedule an EGD and colonoscopy.  He denies having any heartburn but every once in a while he has dysphagia and describes having hiccups when he produces phlegm which results in gagging and vomiting up bile-like secretions.  He denies having any upper abdominal pain.  His most recent EGD was 01/22/2022 which was done by Dr. Bartholome Ligas secondary to a food bolus impaction.  He denies having any lower abdominal pain.  He is passing normal brown bowel movements most days.  No rectal bleeding. His most recent colonoscopy was 09/19/2017 which identified one 3 mm tubular adenomatous polyp which was removed from the transverse colon and mild diverticulosis to the sigmoid and descending colon      He was seen by cardiologist Dr. Lawana Pray 11/30/2023 and at that time, the patient indicated he sometimes has palpitations without chest pain or shortness of breath.  Dr. Lawana Pray provided cardiac clearance at that time for an EGD and colonoscopy without requiring any further cardiac testing and he would be at intermediate risk for an intermediate risk procedure. He was also cleared to hold Brilinta  for EGD/colonoscopy.       Latest Ref Rng & Units 07/21/2022    8:47 AM 02/10/2022    8:04 AM 10/09/2020    4:14 PM  CBC  WBC 4.0 - 10.5 K/uL 6.7  5.7  5.7   Hemoglobin 13.0 - 17.0 g/dL 47.4  25.9  56.3   Hematocrit 39.0 -  52.0 % 50.4  43.9  39.9   Platelets 150.0 - 400.0 K/uL 223.0  159.0  270        Latest Ref Rng & Units 11/18/2023    9:06 AM 06/10/2023   12:25 PM 03/24/2023    4:16 PM  CMP  Glucose 70 - 99 mg/dL 73  92  875   BUN 8 - 23 mg/dL 14  15  15    Creatinine 0.61 - 1.24 mg/dL 6.43  3.29  5.18   Sodium 135 - 145 mmol/L 139  138  139   Potassium 3.5 - 5.1 mmol/L 3.7  4.3  4.4   Chloride 98 - 111 mmol/L 109  105  106   CO2 22 - 32 mmol/L 24  25  25    Calcium  8.9 - 10.3 mg/dL 9.3  9.2  9.4     ECHO 09/27/2023: Left ventricular ejection fraction, by estimation, is 55 to 60%. Left ventricular ejection fraction by 3D volume is 63 %. The left ventricle has normal function. The left ventricle has no regional wall motion abnormalities. Left ventricular diastolic parameters are consistent with Grade II diastolic dysfunction (pseudonormalization). The average left ventricular global longitudinal strain is -17.6 %. The global longitudinal strain is normal. 1. Right ventricular systolic function is normal. The right ventricular size is normal. The estimated right ventricular systolic pressure is 14.6 mmHg. 2. 3. Left atrial size was mildly dilated. 4. The mitral valve  is normal in structure. Mild to moderate mitral valve regurgitation. The aortic valve is tricuspid. There is mild thickening of the aortic valve. Aortic valve regurgitation is not visualized. Aortic valve sclerosis is present, with no evidence of aortic valve stenosis. 5. The inferior vena cava is normal in size with greater than 50% respiratory variability, suggesting right atrial pressure of 3 mmHg  Past Medical History:  Diagnosis Date   Allergy    CHF (congestive heart failure) (HCC)    Colon polyps    Complication of anesthesia    hard to wake up   CONTACT DERMATITIS 09/03/2009   ELEVATED BLOOD PRESSURE 06/19/2010   HYPERLIPIDEMIA 08/27/2009   UNSPECIFIED OTALGIA 06/19/2010   Past Surgical History:  Procedure Laterality Date   APPENDECTOMY   1983   BALLOON DILATION N/A 02/18/2022   Procedure: BALLOON DILATION;  Surgeon: Nannette Babe, MD;  Location: [facility] ENDOSCOPY;  Service: Gastroenterology;  Laterality: N/A;   BIOPSY  08/06/2020   Procedure: BIOPSY;  Surgeon: Annis Kinder, DO;  Location: [facility] ENDOSCOPY;  Service: Gastroenterology;;  esophageal manometry probe  placement   CORONARY/GRAFT ACUTE MI REVASCULARIZATION N/A 09/15/2020   Procedure: Coronary/Graft Acute MI Revascularization;  Surgeon: Arleen Lacer, MD;  Location: [facility] INVASIVE CV LAB;  Service: Cardiovascular;  Laterality: N/A;   ESOPHAGEAL MANOMETRY N/A 08/06/2020   Procedure: ESOPHAGEAL MANOMETRY (EM);  Surgeon: Annis Kinder, DO;  Location: [facility] ENDOSCOPY;  Service: Gastroenterology;  Laterality: N/A;   ESOPHAGOGASTRODUODENOSCOPY N/A 09/15/2020   Procedure: ESOPHAGOGASTRODUODENOSCOPY (EGD);  Surgeon: Hilarie Lovely, MD;  Location: [facility];  Service: Thoracic;  Laterality: N/A;   ESOPHAGOGASTRODUODENOSCOPY (EGD) WITH PROPOFOL  N/A 08/06/2020   Procedure: ESOPHAGOGASTRODUODENOSCOPY (EGD) WITH PROPOFOL ;  Surgeon: Annis Kinder, DO;  Location: [facility] ENDOSCOPY;  Service: Gastroenterology;  Laterality: N/A;   ESOPHAGOGASTRODUODENOSCOPY (EGD) WITH PROPOFOL  N/A 01/09/2022   Procedure: ESOPHAGOGASTRODUODENOSCOPY (EGD) WITH PROPOFOL ;  Surgeon: Alvis Jourdain, MD;  Location: [facility] ENDOSCOPY;  Service: Gastroenterology;  Laterality: N/A;   ESOPHAGOGASTRODUODENOSCOPY (EGD) WITH PROPOFOL  N/A 02/18/2022   Procedure: ESOPHAGOGASTRODUODENOSCOPY (EGD) WITH PROPOFOL ;  Surgeon: Nannette Babe, MD;  Location: [facility] ENDOSCOPY;  Service: Gastroenterology;  Laterality: N/A;   EYE MUSCLE SURGERY Right    LEFT HEART CATH AND CORONARY ANGIOGRAPHY N/A 09/15/2020   Procedure: LEFT HEART CATH AND CORONARY ANGIOGRAPHY;  Surgeon: Arleen Lacer, MD;  Location: [facility] INVASIVE CV LAB;  Service: Cardiovascular;  Laterality: N/A;   MIDDLE EAR SURGERY     ROTATOR CUFF REPAIR Left    TONSILLECTOMY  1965   XI  ROBOTIC ASSISTED HIATAL HERNIA REPAIR N/A 09/15/2020   Procedure: XI ROBOTIC ASSISTED HIATAL HERNIA REPAIR WITH MESH;  Surgeon: Hilarie Lovely, MD;  Location: [facility] OR;  Service: Thoracic;  Laterality: N/A;    Allergies  Allergen Reactions   Meperidine Hcl Nausea And Vomiting   Penicillins Other (See Comments)    REACTION: Childhood   Advil [Ibuprofen] Palpitations   Pseudoephedrine Hcl Palpitations      Outpatient Encounter Medications as of 12/08/2023  Medication Sig   aspirin  81 MG chewable tablet Chew 1 tablet (81 mg total) by mouth daily.   atorvastatin  (LIPITOR ) 80 MG tablet TAKE 1 TABLET BY MOUTH DAILY   BRILINTA  60 MG TABS tablet TAKE 1 TABLET BY MOUTH 2 TIMES A DAY   ENTRESTO  24-26 MG TAKE 1 TABLET BY MOUTH 2 TIMES A DAY   FARXIGA  10 MG TABS tablet TAKE 1 TABLET BY MOUTH EVERY DAY   metoprolol  succinate (TOPROL -XL) 25 MG 24 hr  tablet TAKE 1 TABLET BY MOUTH DAILY   mexiletine (MEXITIL) 150 MG capsule TAKE 1 CAPSULE (150 MG TOTAL) BY MOUTH 2 (TWO) TIMES DAILY.   pantoprazole  (PROTONIX ) 40 MG tablet TAKE 1 TABLET BY MOUTH DAILY   sildenafil  (VIAGRA ) 100 MG tablet Take 1 tablet (100 mg total) by mouth daily as needed for erectile dysfunction.   spironolactone  (ALDACTONE ) 25 MG tablet TAKE 1 TABLET BY MOUTH EVERYDAY AT BEDTIME   No facility-administered encounter medications on file as of 12/08/2023.     REVIEW OF SYSTEMS:  Gen: Denies fever, sweats or chills. No weight loss.  CV: Denies chest pain, palpitations or edema. Resp: Denies cough, shortness of breath of hemoptysis.  GI: Denies heartburn, dysphagia, stomach or lower abdominal pain. No diarrhea or constipation.  GU: Denies urinary burning, blood in urine, increased urinary frequency or incontinence. MS: Denies joint pain, muscles aches or weakness. Derm: Denies rash, itchiness, skin lesions or unhealing ulcers. Psych: Denies depression, anxiety, memory loss or confusion. Heme: Denies bruising, easy  bleeding. Neuro:  Denies headaches, dizziness or paresthesias. Endo:  Denies any problems with DM, thyroid  or adrenal function.  PHYSICAL EXAM: BP 96/60 (BP Location: Left Arm, Patient Position: Sitting, Cuff Size: Normal)   Pulse 67   Ht 5' 6 (1.676 m)   Wt 162 lb 8 oz (73.7 kg)   BMI 26.23 kg/m  General: in no acute distress. Head: Normocephalic and atraumatic. Eyes:  Sclerae non-icteric, conjunctive pink. Ears: Normal auditory acuity. Mouth: Dentition intact. No ulcers or lesions.  Neck: Supple, no lymphadenopathy or thyromegaly.  Lungs: Clear bilaterally to auscultation without wheezes, crackles or rhonchi. Heart: Regular rate and rhythm. No murmur, rub or gallop appreciated.  Abdomen: Soft, nontender, nondistended. No masses. No hepatosplenomegaly. Normoactive bowel sounds x 4 quadrants.  Rectal:  Musculoskeletal: Symmetrical with no gross deformities. Skin: Warm and dry. No rash or lesions on visible extremities. Extremities: No edema. Neurological: Alert oriented x 4, no focal deficits.  Psychological: Alert and cooperative. Normal mood and affect.  ASSESSMENT AND PLAN:  68 year old male with a history of GERD, esophageal stricture and dysphagia including a food bolus impaction 12/2021 followed by a repeat EGD with esophageal dilatation 02/18/2022. He has recurrent mild dysphagia and presents today to repeat an EGD with esophageal dilatation.  - EGD with esophageal dilatation at [facility] (history of difficult intubation) - Continue Pantoprazole  40 mg daily  History of colon polyps. Colonoscopy 08/2017 identified 1 tubular adenomatous polyp removed from the distal transverse colon.  -Colonoscopy at [facility] (history of difficult intubation) benefits and risks discussed including risk with sedation, risk of bleeding, perforation and infection   History of CAD, s/p STEMI 3/22 and s/p DES to LAD and PTCA D1 on Brilinta  and ASA 81mg .  -Cardiac clearance and Brilinta  hold clearance received  from cardiologist Dr. Lawana Pray, patient to proceed with EGD and colonoscopy without any further cardiac testing.  Chronic systolic CHF. Ischemic cardiomyopathy. LVEF 55 - 60% per ECHO 09/27/2023. CC:  Marquetta Sit, MD

## 2023-12-12 NOTE — Progress Notes (Signed)
 Addendum: Reviewed and agree with assessment and management plan. Brilinta  is a DAPT and should be held for 5 days Okay for hospital outpatient upper and lower endoscopy with me If this is urgent and needs to be done sooner than September I could look for time during an upcoming hospital week. Ebbie Sorenson, Amber Bail, MD

## 2023-12-13 NOTE — Progress Notes (Signed)
 Dottie, refer to Dr. Marietta Shorter addendum below. Pls contact patient to schedule EGD/colonoscopy at [facility] with Dr. Bridgett Camps. Pls instruct patient to hold Brilinta  x 5 days prior to his procedure date. THX.

## 2023-12-13 NOTE — H&P (View-Only) (Signed)
 Jerry Kramer, refer to Dr. Marietta Shorter addendum below. Pls contact patient to schedule EGD/colonoscopy at [facility] with Dr. Bridgett Camps. Pls instruct patient to hold Brilinta  x 5 days prior to his procedure date. THX.

## 2023-12-14 ENCOUNTER — Other Ambulatory Visit: Payer: Self-pay | Admitting: *Deleted

## 2023-12-14 DIAGNOSIS — Z860100 Personal history of colon polyps, unspecified: Secondary | ICD-10-CM

## 2023-12-14 DIAGNOSIS — R1310 Dysphagia, unspecified: Secondary | ICD-10-CM

## 2023-12-14 DIAGNOSIS — K222 Esophageal obstruction: Secondary | ICD-10-CM

## 2023-12-14 NOTE — Addendum Note (Signed)
 Addended by: Glennette Lanius on: 12/14/2023 04:57 PM   Modules accepted: Orders

## 2023-12-14 NOTE — Progress Notes (Signed)
 I have spoken to Dr Bridgett Camps who indicates patient may have endoscopy/colonoscopy on 12/27/23 or 12/28/23 at [facility] if the hospital is able to accommodate. Hospital orders entered in [facility] and we are awaiting a response from hospital staff regarding date/timing for procedures.

## 2023-12-15 ENCOUNTER — Encounter: Payer: Self-pay | Admitting: *Deleted

## 2023-12-15 MED ORDER — NA SULFATE-K SULFATE-MG SULF 17.5-3.13-1.6 GM/177ML PO SOLN: ORAL | 0 refills | Status: DC

## 2023-12-15 NOTE — Addendum Note (Signed)
 Addended by: Glennette Lanius on: 12/15/2023 08:27 AM   Modules accepted: Orders

## 2023-12-15 NOTE — Progress Notes (Signed)
 Patient is scheduled for endoscopy/colonoscopy at [facility] on 12/27/23 at 8:15 am.  Patient has been advised of time/date/location for upcoming procedure and has been given generalized verbal prep instructions. Discussed that a care partner 18 years or older should bring him, stay for the procedure and drive home due to sedation. Additionally, patietn is advised that he should hold Brilinta  5 days prior to his upcoming procedure and should hold Farxiga  morning of procedure. Written instructions have been made available to the patient for additional review via mychart.

## 2023-12-16 ENCOUNTER — Other Ambulatory Visit: Payer: Self-pay | Admitting: Internal Medicine

## 2023-12-20 ENCOUNTER — Telehealth: Payer: Self-pay

## 2023-12-20 ENCOUNTER — Encounter (HOSPITAL_COMMUNITY): Payer: Self-pay | Admitting: Internal Medicine

## 2023-12-20 NOTE — Telephone Encounter (Signed)
 Procedure:EGD Procedure date: 12/27/23 Procedure location: [facility] Arrival Time: 6:45 Spoke with the patient Y/N: Y Any prep concerns? N  Has the patient obtained the prep from the pharmacy ? N Do you have a care partner and transportation: Y Any additional concerns? N

## 2023-12-27 ENCOUNTER — Ambulatory Visit (HOSPITAL_COMMUNITY): Payer: Self-pay | Admitting: Anesthesiology

## 2023-12-27 ENCOUNTER — Encounter (HOSPITAL_COMMUNITY): Payer: Self-pay | Admitting: Internal Medicine

## 2023-12-27 ENCOUNTER — Other Ambulatory Visit: Payer: Self-pay

## 2023-12-27 ENCOUNTER — Ambulatory Visit (HOSPITAL_COMMUNITY)
Admission: RE | Admit: 2023-12-27 | Discharge: 2023-12-27 | Disposition: A | Discharge status: Home / Self Care | Attending: Internal Medicine | Admitting: Internal Medicine

## 2023-12-27 ENCOUNTER — Encounter (HOSPITAL_COMMUNITY)
Admission: RE | Disposition: A | Discharge status: Home / Self Care | Payer: Self-pay | Source: Home / Self Care | Attending: Internal Medicine

## 2023-12-27 DIAGNOSIS — Q399 Congenital malformation of esophagus, unspecified: Secondary | ICD-10-CM | POA: Diagnosis not present

## 2023-12-27 DIAGNOSIS — D122 Benign neoplasm of ascending colon: Secondary | ICD-10-CM

## 2023-12-27 DIAGNOSIS — Z7982 Long term (current) use of aspirin: Secondary | ICD-10-CM | POA: Diagnosis not present

## 2023-12-27 DIAGNOSIS — K9189 Other postprocedural complications and disorders of digestive system: Secondary | ICD-10-CM

## 2023-12-27 DIAGNOSIS — I252 Old myocardial infarction: Secondary | ICD-10-CM | POA: Diagnosis not present

## 2023-12-27 DIAGNOSIS — K5140 Inflammatory polyps of colon without complications: Secondary | ICD-10-CM | POA: Insufficient documentation

## 2023-12-27 DIAGNOSIS — I25110 Atherosclerotic heart disease of native coronary artery with unstable angina pectoris: Secondary | ICD-10-CM | POA: Diagnosis not present

## 2023-12-27 DIAGNOSIS — Z1211 Encounter for screening for malignant neoplasm of colon: Secondary | ICD-10-CM | POA: Diagnosis not present

## 2023-12-27 DIAGNOSIS — R1310 Dysphagia, unspecified: Secondary | ICD-10-CM | POA: Insufficient documentation

## 2023-12-27 DIAGNOSIS — Z860101 Personal history of adenomatous and serrated colon polyps: Secondary | ICD-10-CM | POA: Diagnosis not present

## 2023-12-27 DIAGNOSIS — I080 Rheumatic disorders of both mitral and aortic valves: Secondary | ICD-10-CM | POA: Insufficient documentation

## 2023-12-27 DIAGNOSIS — K5730 Diverticulosis of large intestine without perforation or abscess without bleeding: Secondary | ICD-10-CM | POA: Diagnosis not present

## 2023-12-27 DIAGNOSIS — I2510 Atherosclerotic heart disease of native coronary artery without angina pectoris: Secondary | ICD-10-CM | POA: Diagnosis not present

## 2023-12-27 DIAGNOSIS — Z79899 Other long term (current) drug therapy: Secondary | ICD-10-CM | POA: Diagnosis not present

## 2023-12-27 DIAGNOSIS — Z955 Presence of coronary angioplasty implant and graft: Secondary | ICD-10-CM | POA: Insufficient documentation

## 2023-12-27 DIAGNOSIS — Z9889 Other specified postprocedural states: Secondary | ICD-10-CM | POA: Diagnosis not present

## 2023-12-27 DIAGNOSIS — K648 Other hemorrhoids: Secondary | ICD-10-CM | POA: Diagnosis not present

## 2023-12-27 DIAGNOSIS — K3189 Other diseases of stomach and duodenum: Secondary | ICD-10-CM

## 2023-12-27 DIAGNOSIS — I25119 Atherosclerotic heart disease of native coronary artery with unspecified angina pectoris: Secondary | ICD-10-CM | POA: Insufficient documentation

## 2023-12-27 DIAGNOSIS — D759 Disease of blood and blood-forming organs, unspecified: Secondary | ICD-10-CM | POA: Diagnosis not present

## 2023-12-27 DIAGNOSIS — K219 Gastro-esophageal reflux disease without esophagitis: Secondary | ICD-10-CM | POA: Diagnosis not present

## 2023-12-27 DIAGNOSIS — I5021 Acute systolic (congestive) heart failure: Secondary | ICD-10-CM | POA: Diagnosis not present

## 2023-12-27 DIAGNOSIS — K449 Diaphragmatic hernia without obstruction or gangrene: Secondary | ICD-10-CM | POA: Insufficient documentation

## 2023-12-27 DIAGNOSIS — I509 Heart failure, unspecified: Secondary | ICD-10-CM | POA: Diagnosis not present

## 2023-12-27 DIAGNOSIS — K2960 Other gastritis without bleeding: Secondary | ICD-10-CM | POA: Diagnosis not present

## 2023-12-27 DIAGNOSIS — K2900 Acute gastritis without bleeding: Secondary | ICD-10-CM

## 2023-12-27 HISTORY — PX: ESOPHAGOGASTRODUODENOSCOPY: SHX5428

## 2023-12-27 HISTORY — PX: COLONOSCOPY: SHX5424

## 2023-12-27 SURGERY — EGD (ESOPHAGOGASTRODUODENOSCOPY)
Anesthesia: Monitor Anesthesia Care

## 2023-12-27 MED ORDER — PROPOFOL 1000 MG/100ML IV EMUL
INTRAVENOUS | Status: AC
  Filled 2023-12-27: qty 100

## 2023-12-27 MED ORDER — SODIUM CHLORIDE 0.9 % IV SOLN
INTRAVENOUS | Status: AC | PRN
  Administered 2023-12-27: 500 mL via INTRAMUSCULAR

## 2023-12-27 MED ORDER — EPHEDRINE SULFATE-NACL 50-0.9 MG/10ML-% IV SOSY
PREFILLED_SYRINGE | INTRAVENOUS | Status: DC | PRN
  Administered 2023-12-27: 5 mg via INTRAVENOUS
  Administered 2023-12-27 (×2): 10 mg via INTRAVENOUS

## 2023-12-27 MED ORDER — LIDOCAINE 2% (20 MG/ML) 5 ML SYRINGE
INTRAMUSCULAR | Status: DC | PRN
  Administered 2023-12-27: 80 mg via INTRAVENOUS

## 2023-12-27 MED ORDER — PROPOFOL 500 MG/50ML IV EMUL
INTRAVENOUS | Status: DC | PRN
  Administered 2023-12-27: 135 ug/kg/min via INTRAVENOUS

## 2023-12-27 MED ORDER — PROPOFOL 10 MG/ML IV BOLUS
INTRAVENOUS | Status: DC | PRN
  Administered 2023-12-27: 10 mg via INTRAVENOUS
  Administered 2023-12-27: 30 mg via INTRAVENOUS
  Administered 2023-12-27 (×3): 20 mg via INTRAVENOUS
  Administered 2023-12-27: 30 mg via INTRAVENOUS

## 2023-12-27 NOTE — Op Note (Signed)
 [facility] Patient Name: Jerry Kramer Procedure Date: 12/27/2023 MRN: [id number] Attending MD: Gordy CHRISTELLA Starch , MD, [id number] Date of Birth: September 01, 1955 CSN: [id number] Age: 68 Admit Type: Outpatient Procedure:                Colonoscopy Indications:              High risk colon cancer surveillance: Personal                            history of non-advanced adenoma, Last colonoscopy:                            March 2019 (TA x 1) Providers:                Gordy CHRISTELLA. Starch, MD, Clotilda Schmitz, RN, Jasmine                            Pet[place]ord, Technician Referring MD:             Wolm MICAEL Scarlet Medicines:                Monitored Anesthesia Care Complications:            No immediate complications. Estimated Blood Loss:     Estimated blood loss was minimal. Procedure:                Pre-Anesthesia Assessment:                           - Prior to the procedure, a History and Physical                            was performed, and patient medications and                            allergies were reviewed. The patient's tolerance of                            previous anesthesia was also reviewed. The risks                            and benefits of the procedure and the sedation                            options and risks were discussed with the patient.                            All questions were answered, and informed consent                            was obtained. Prior Anticoagulants: The patient has                            taken Brilinta  (ticagrelor ), last dose was 5 days  prior to procedure. ASA Grade Assessment: III - A                            patient with severe systemic disease. After                            reviewing the risks and benefits, the patient was                            deemed in satisfactory condition to undergo the                            procedure.                           After obtaining informed consent, the  colonoscope                            was passed under direct vision. Throughout the                            procedure, the patient's blood pressure, pulse, and                            oxygen saturations were monitored continuously. The                            CF-HQ190L ([id number] Olympus colonoscope was                            introduced through the anus and advanced to the                            cecum, identified by appendiceal orifice and                            ileocecal valve. The colonoscopy was performed                            without difficulty. The patient tolerated the                            procedure well. The quality of the bowel                            preparation was good. The ileocecal valve,                            appendiceal orifice, and rectum were photographed. Scope In: 8:45:09 AM Scope Out: 9:01:13 AM Scope Withdrawal Time: 0 hours 12 minutes 28 seconds  Total Procedure Duration: 0 hours 16 minutes 4 seconds  Findings:      The digital rectal exam was normal.      A 7 mm polyp was found in the ascending colon. The polyp was sessile.  The polyp was removed with a cold snare. Resection and retrieval were       complete.      Multiple medium-mouthed and small-mouthed diverticula were found in the       sigmoid colon.      Internal hemorrhoids were found during retroflexion. The hemorrhoids       were small. Impression:               - One 7 mm polyp in the ascending colon, removed                            with a cold snare. Resected and retrieved.                           - Moderate diverticulosis in the sigmoid colon.                           - Small internal hemorrhoids. Moderate Sedation:      N/A Recommendation:           - Patient has a contact number available for                            emergencies. The signs and symptoms of potential                            delayed complications were discussed with the                             patient. Return to normal activities tomorrow.                            Written discharge instructions were provided to the                            patient.                           - Resume previous diet.                           - Continue present medications.                           - Await pathology results.                           - Repeat colonoscopy is recommended for                            surveillance. The colonoscopy date will be                            determined after pathology results from today's                            exam become available for review.                           -  Resume Brilinta  (ticagrelor ) at prior dose                            tomorrow. Refer to managing physician for further                            adjustment of therapy. Procedure Code(s):        --- Professional ---                           [phone], Colonoscopy, flexible; with removal of                            tumor(s), polyp(s), or other lesion(s) by snare                            technique Diagnosis Code(s):        --- Professional ---                           Z86.010, Personal history of colonic polyps                           D12.2, Benign neoplasm of ascending colon                           K64.8, Other hemorrhoids                           K57.30, Diverticulosis of large intestine without                            perforation or abscess without bleeding CPT copyright 2022 [employer]. All rights reserved. The codes documented in this report are preliminary and upon coder review may  be revised to meet current compliance requirements. Gordy CHRISTELLA Starch, MD 12/27/2023 9:05:23 AM This report has been signed electronically. Number of Addenda: 0

## 2023-12-27 NOTE — Op Note (Signed)
 [facility] Patient Name: Jerry Kramer Procedure Date: 12/27/2023 MRN: [id number] Attending MD: Gordy CHRISTELLA Starch , MD, [id number] Date of Birth: [birth year] CSN: [id number] Age: 68 Admit Type: Outpatient Procedure:                Upper GI endoscopy Indications:              Dysphagia; Personal history of paraesophageal                            hernia repair with mesh placement and Dor                            fundoplication in March 2022; history of food                            impaction July 2023; last EGD 02/18/2022 with distal                            esophageal tortuosity dilated to 16.5 mm with                            balloon Providers:                Gordy CHRISTELLA. Starch, MD, Clotilda Schmitz, RN, Jasmine                            Petiford, Technician, Sidra Clam, CRNA Referring MD:             Wolm MICAEL Scarlet Medicines:                Monitored Anesthesia Care Complications:            No immediate complications. Estimated Blood Loss:     Estimated blood loss was minimal. Procedure:                Pre-Anesthesia Assessment:                           - Prior to the procedure, a History and Physical                            was performed, and patient medications and                            allergies were reviewed. The patient's tolerance of                            previous anesthesia was also reviewed. The risks                            and benefits of the procedure and the sedation                            options and risks were discussed with the patient.  All questions were answered, and informed consent                            was obtained. Prior Anticoagulants: The patient has                            taken Brilinta  (ticagrelor ), last dose was 5 days                            prior to procedure. ASA Grade Assessment: III - A                            patient with severe systemic disease. After                             reviewing the risks and benefits, the patient was                            deemed in satisfactory condition to undergo the                            procedure.                           After obtaining informed consent, the endoscope was                            passed under direct vision. Throughout the                            procedure, the patient's blood pressure, pulse, and                            oxygen saturations were monitored continuously. The                            [id number]-H190 ([id number] Olympus endoscope was introduced                            through the mouth, and advanced to the second part                            of duodenum. The upper GI endoscopy was                            accomplished without difficulty. The patient                            tolerated the procedure well. Scope In: Scope Out: Findings:      The lower third of the esophagus was moderately tortuous.      The Z-line was regular and was found 40 cm from the incisors. There is       no definitive stricture, though there is angulation at the distal most  esophagus and GE junction. A TTS dilator was passed through the scope.       Dilation with a 15-16.5-18 mm balloon dilator was performed in the lower       esophagus and across the GE junction to 18 mm. The dilation site was       examined and showed moderate improvement in luminal narrowing.      Evidence of a prior Dor fundoplication was found in the cardia. This was       characterized by erythema and an intact appearance.      Localized moderate inflammation characterized by erosions, erythema and       granularity was found in the gastric antrum. Biopsies were taken with a       cold forceps for Helicobacter pylori testing.      The examined duodenum was normal. Impression:               - Tortuous distal esophagus. Likely primary reason                            for intermittent dysphagia.                           -  Z-line regular, 40 cm from the incisors. Lower                            esophagus and GE junction dilated with 18 mm                            balloon.                           - An a Dor fundoplication was found, characterized                            by an intact appearance and erythema.                           - Acute antral gastritis. Biopsied.                           - Normal examined duodenum. Moderate Sedation:      N/A Recommendation:           - Patient has a contact number available for                            emergencies. The signs and symptoms of potential                            delayed complications were discussed with the                            patient. Return to normal activities tomorrow.                            Written discharge instructions were provided to the  patient.                           - Resume previous diet.                           - Continue present medications.                           - Await pathology results.                           - See the other procedure note for documentation of                            additional recommendations. Procedure Code(s):        --- Professional ---                           [phone], Esophagogastroduodenoscopy, flexible,                            transoral; with transendoscopic balloon dilation of                            esophagus (less than 30 mm diameter)                           [id number], 59, Esophagogastroduodenoscopy, flexible,                            transoral; with biopsy, single or multiple Diagnosis Code(s):        --- Professional ---                           Q39.9, Congenital malformation of esophagus,                            unspecified                           Z98.890, Other specified postprocedural states                           K29.00, Acute gastritis without bleeding                           R13.10, Dysphagia, unspecified CPT copyright  2022 American Medical Association. All rights reserved. The codes documented in this report are preliminary and upon coder review may  be revised to meet current compliance requirements. Gordy CHRISTELLA Starch, MD 12/27/2023 9:06:44 AM This report has been signed electronically. Number of Addenda: 0

## 2023-12-27 NOTE — Interval H&P Note (Signed)
 History and Physical Interval Note: For EGD with probable dilation and colonoscopy today See current H&P for details.  No pertinent changes. Patient has been off Brilinta  x 5 days   12/27/2023 8:04 AM  Jerry Kramer  has presented today for surgery, with the diagnosis of esophageal stricture, hx colon polyps.  The various methods of treatment have been discussed with the patient and family. After consideration of risks, benefits and other options for treatment, the patient has consented to  Procedure(s): EGD (ESOPHAGOGASTRODUODENOSCOPY) (N/A) COLONOSCOPY (N/A) as a surgical intervention.  The patient's history has been reviewed, patient examined, no change in status, stable for surgery.  I have reviewed the patient's chart and labs.  Questions were answered to the patient's satisfaction.     Gordy HERO Shyra Emile

## 2023-12-27 NOTE — Anesthesia Postprocedure Evaluation (Signed)
 Anesthesia Post Note  Patient: Jerry Kramer  Procedure(s) Performed: EGD (ESOPHAGOGASTRODUODENOSCOPY) COLONOSCOPY     Patient location during evaluation: Endoscopy Anesthesia Type: MAC Level of consciousness: awake and alert Pain management: pain level controlled Vital Signs Assessment: post-procedure vital signs reviewed and stable Respiratory status: spontaneous breathing, nonlabored ventilation, respiratory function stable and patient connected to nasal cannula oxygen Cardiovascular status: blood pressure returned to baseline and stable Postop Assessment: no apparent nausea or vomiting Anesthetic complications: no  No notable events documented.  Last Vitals:  Vitals:   12/27/23 0928 12/27/23 0936  BP: (!) 102/46 (!) 103/48  Pulse: 73 77  Resp: 18 (!) 21  Temp:    SpO2: 97% 97%    Last Pain:  Vitals:   12/27/23 0936  TempSrc:   PainSc: 0-No pain                 [name]

## 2023-12-27 NOTE — Anesthesia Procedure Notes (Signed)
 Procedure Name: MAC Date/Time: 12/27/2023 8:20 AM  Performed by: Brandy Almarie BROCKS, CRNAPre-anesthesia Checklist: Patient identified, Emergency Drugs available, Suction available and Patient being monitored Oxygen Delivery Method: Simple face mask

## 2023-12-27 NOTE — Discharge Instructions (Signed)

## 2023-12-27 NOTE — Transfer of Care (Signed)
 Immediate Anesthesia Transfer of Care Note  Patient: Jerry Kramer  Procedure(s) Performed: EGD (ESOPHAGOGASTRODUODENOSCOPY) COLONOSCOPY  Patient Location: PACU  Anesthesia Type:MAC  Level of Consciousness: drowsy  Airway & Oxygen Therapy: Patient Spontanous Breathing and Patient connected to face mask oxygen  Post-op Assessment: Report given to RN, Post -op Vital signs reviewed and stable, and Patient moving all extremities X 4  Post vital signs: Reviewed and stable  Last Vitals:  Vitals Value Taken Time  BP 95/40   Temp    Pulse 80   Resp 12   SpO2 99     Last Pain:  Vitals:   12/27/23 0715  TempSrc: Tympanic  PainSc: 0-No pain         Complications: No notable events documented.

## 2023-12-27 NOTE — Anesthesia Preprocedure Evaluation (Addendum)
 Anesthesia Evaluation  Patient identified by MRN, date of birth, ID band Patient awake    Reviewed: Allergy & Precautions, NPO status , Patient's Chart, lab work & pertinent test results, reviewed documented beta blocker date and time   History of Anesthesia Complications (+) DIFFICULT AIRWAY and history of anesthetic complications (last airway note 2022: easy mask ventilation, grade 1 view with glide 4)  Airway Mallampati: II  TM Distance: >3 FB Neck ROM: Full    Dental no notable dental hx. (+) Teeth Intact, Dental Advisory Given   Pulmonary neg pulmonary ROS   Pulmonary exam normal breath sounds clear to auscultation       Cardiovascular + angina  + CAD, + Past MI, + Cardiac Stents and +CHF  Normal cardiovascular exam Rhythm:Regular Rate:Normal  TTE 2025 1. Left ventricular ejection fraction, by estimation, is 55 to 60%. Left  ventricular ejection fraction by 3D volume is 63 %. The left ventricle has  normal function. The left ventricle has no regional wall motion  abnormalities. Left ventricular diastolic   parameters are consistent with Grade II diastolic dysfunction  (pseudonormalization). The average left ventricular global longitudinal  strain is -17.6 %. The global longitudinal strain is normal.   2. Right ventricular systolic function is normal. The right ventricular  size is normal. The estimated right ventricular systolic pressure is 14.6  mmHg.   3. Left atrial size was mildly dilated.   4. The mitral valve is normal in structure. Mild to moderate mitral valve  regurgitation.   5. The aortic valve is tricuspid. There is mild thickening of the aortic  valve. Aortic valve regurgitation is not visualized. Aortic valve  sclerosis is present, with no evidence of aortic valve stenosis.   6. The inferior vena cava is normal in size with greater than 50%  respiratory variability, suggesting right atrial pressure of 3 mmHg.    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  Acute hypoxic respite failure secondary to acute pulmonary edema -> patient will be intubated upon arrival to CCU    Neuro/Psych negative neurological ROS  negative psych ROS   GI/Hepatic Neg liver ROS, hiatal hernia,GERD  ,,  Endo/Other  negative endocrine ROS    Renal/GU negative Renal ROS  negative genitourinary   Musculoskeletal negative musculoskeletal ROS (+)    Abdominal   Peds  Hematology  (+) Blood dyscrasia (Brilinta )   Anesthesia Other Findings   Reproductive/Obstetrics                             Anesthesia Physical Anesthesia Plan  ASA: 3  Anesthesia Plan: MAC   Post-op Pain Management:    Induction: Intravenous  PONV Risk Score and Plan: Propofol  infusion and Treatment may vary due to age or medical condition  Airway Management Planned: Natural Airway  Additional Equipment:  Intra-op Plan:   Post-operative Plan:   Informed Consent: I have reviewed the patients History and Physical, chart, labs and discussed the procedure including the risks, benefits and alternatives for the proposed anesthesia with the patient or authorized representative who has indicated his/her understanding and acceptance.     Dental advisory given  Plan Discussed with:  CRNA  Anesthesia Plan Comments:        Anesthesia Quick Evaluation

## 2023-12-29 LAB — SURGICAL PATHOLOGY

## 2024-01-03 ENCOUNTER — Ambulatory Visit: Payer: Self-pay | Admitting: Internal Medicine

## 2024-01-04 ENCOUNTER — Other Ambulatory Visit (HOSPITAL_COMMUNITY): Payer: Self-pay | Admitting: Cardiology

## 2024-02-04 ENCOUNTER — Other Ambulatory Visit (HOSPITAL_COMMUNITY): Payer: Self-pay | Admitting: Cardiology

## 2024-02-04 ENCOUNTER — Other Ambulatory Visit: Payer: Self-pay | Admitting: Internal Medicine

## 2024-03-13 ENCOUNTER — Other Ambulatory Visit: Payer: Self-pay | Admitting: Internal Medicine

## 2024-04-03 ENCOUNTER — Ambulatory Visit (INDEPENDENT_AMBULATORY_CARE_PROVIDER_SITE_OTHER): Admitting: Family Medicine

## 2024-04-03 ENCOUNTER — Encounter: Payer: Self-pay | Admitting: Family Medicine

## 2024-04-03 ENCOUNTER — Ambulatory Visit: Payer: Self-pay | Admitting: Family Medicine

## 2024-04-03 ENCOUNTER — Ambulatory Visit (HOSPITAL_COMMUNITY): Payer: Self-pay | Admitting: Cardiology

## 2024-04-03 ENCOUNTER — Ambulatory Visit (HOSPITAL_COMMUNITY)
Admission: RE | Admit: 2024-04-03 | Discharge: 2024-04-03 | Disposition: A | Discharge status: Home / Self Care | Source: Ambulatory Visit | Attending: Cardiology | Admitting: Cardiology

## 2024-04-03 VITALS — BP 90/60 | HR 63 | Temp 97.8°F | Ht 65.35 in | Wt 165.8 lb

## 2024-04-03 VITALS — BP 92/58 | HR 54 | Wt 165.0 lb

## 2024-04-03 DIAGNOSIS — I2510 Atherosclerotic heart disease of native coronary artery without angina pectoris: Secondary | ICD-10-CM | POA: Insufficient documentation

## 2024-04-03 DIAGNOSIS — Z955 Presence of coronary angioplasty implant and graft: Secondary | ICD-10-CM | POA: Insufficient documentation

## 2024-04-03 DIAGNOSIS — I493 Ventricular premature depolarization: Secondary | ICD-10-CM | POA: Insufficient documentation

## 2024-04-03 DIAGNOSIS — Z23 Encounter for immunization: Secondary | ICD-10-CM | POA: Diagnosis not present

## 2024-04-03 DIAGNOSIS — I5020 Unspecified systolic (congestive) heart failure: Secondary | ICD-10-CM | POA: Insufficient documentation

## 2024-04-03 DIAGNOSIS — Z79899 Other long term (current) drug therapy: Secondary | ICD-10-CM | POA: Insufficient documentation

## 2024-04-03 DIAGNOSIS — R001 Bradycardia, unspecified: Secondary | ICD-10-CM | POA: Insufficient documentation

## 2024-04-03 DIAGNOSIS — I110 Hypertensive heart disease with heart failure: Secondary | ICD-10-CM | POA: Insufficient documentation

## 2024-04-03 DIAGNOSIS — Z7902 Long term (current) use of antithrombotics/antiplatelets: Secondary | ICD-10-CM | POA: Insufficient documentation

## 2024-04-03 DIAGNOSIS — Z0000 Encounter for general adult medical examination without abnormal findings: Secondary | ICD-10-CM

## 2024-04-03 DIAGNOSIS — Z8249 Family history of ischemic heart disease and other diseases of the circulatory system: Secondary | ICD-10-CM | POA: Diagnosis not present

## 2024-04-03 DIAGNOSIS — I255 Ischemic cardiomyopathy: Secondary | ICD-10-CM | POA: Diagnosis not present

## 2024-04-03 DIAGNOSIS — I252 Old myocardial infarction: Secondary | ICD-10-CM | POA: Insufficient documentation

## 2024-04-03 DIAGNOSIS — I5022 Chronic systolic (congestive) heart failure: Secondary | ICD-10-CM | POA: Diagnosis not present

## 2024-04-03 LAB — CBC WITH DIFFERENTIAL/PLATELET
Basophils Absolute: 0 K/uL (ref 0.0–0.1)
Basophils Relative: 0.8 % (ref 0.0–3.0)
Eosinophils Absolute: 0.3 K/uL (ref 0.0–0.7)
Eosinophils Relative: 6 % — ABNORMAL HIGH (ref 0.0–5.0)
HCT: 44 % (ref 39.0–52.0)
Hemoglobin: 14.9 g/dL (ref 13.0–17.0)
Lymphocytes Relative: 16.8 % (ref 12.0–46.0)
Lymphs Abs: 0.9 K/uL (ref 0.7–4.0)
MCHC: 33.8 g/dL (ref 30.0–36.0)
MCV: 94.6 fl (ref 78.0–100.0)
Monocytes Absolute: 0.5 K/uL (ref 0.1–1.0)
Monocytes Relative: 9.6 % (ref 3.0–12.0)
Neutro Abs: 3.7 K/uL (ref 1.4–7.7)
Neutrophils Relative %: 66.8 % (ref 43.0–77.0)
Platelets: 200 K/uL (ref 150.0–400.0)
RBC: 4.65 Mil/uL (ref 4.22–5.81)
RDW: 13.5 % (ref 11.5–15.5)
WBC: 5.6 K/uL (ref 4.0–10.5)

## 2024-04-03 LAB — BASIC METABOLIC PANEL WITH GFR
Anion gap: 8 (ref 5–15)
BUN: 14 mg/dL (ref 8–23)
CO2: 20 mmol/L — ABNORMAL LOW (ref 22–32)
Calcium: 9.1 mg/dL (ref 8.9–10.3)
Chloride: 111 mmol/L (ref 98–111)
Creatinine, Ser: 0.97 mg/dL (ref 0.61–1.24)
GFR, Estimated: >60 mL/min (ref >60)
Glucose, Bld: 124 mg/dL — ABNORMAL HIGH (ref 70–99)
Potassium: 3.6 mmol/L (ref 3.5–5.1)
Sodium: 139 mmol/L (ref 135–145)

## 2024-04-03 LAB — HEPATIC FUNCTION PANEL
ALT: 27 U/L (ref 0–53)
AST: 24 U/L (ref 0–37)
Albumin: 4.5 g/dL (ref 3.5–5.2)
Alkaline Phosphatase: 108 U/L (ref 39–117)
Bilirubin, Direct: 0.2 mg/dL (ref 0.0–0.3)
Total Bilirubin: 0.7 mg/dL (ref 0.2–1.2)
Total Protein: 6.4 g/dL (ref 6.0–8.3)

## 2024-04-03 LAB — PSA, MEDICARE: PSA: 0.96 ng/mL (ref 0.10–4.00)

## 2024-04-03 NOTE — Progress Notes (Signed)
 Advanced Heart Failure Clinic Note    PCP: Micheal Wolm ORN, MD Cardiologist: Dr. Rolan   Chief complaint: PVCs  HPI: 68 y.o. with history of HTN, hyperlipidemia, GERD/hiatal hernia.  He is a nonsmoker, mother and 2 brothers had MIs in 15s. Now w/ systolic heart failure and CAD. His daughter, Vertell, works in [employer] PACU.  He had been dealing with peptic stricture and hiatal hernia for a while now; he saw Dr. Shyrl and underwent robotic assisted laparoscopic paraesophageal hernia repair with fundoplication on 09/15/20.  Post-op, he was noted to have PVCs then developed chest pain with anterolateral ST elevation.  He was taken emergently for cath.  He was found to have acute occlusion of the proximal LAD.  This was treated with DES, and occluded D1 was treated with PTCA. LVEDP was around 26 mmHg.  After procedure, patient developed respiratory distress w/ lactic acidosis and AKI. Intubated and started on IV Lasix . Central line placed for co-ox and CVP monitoring. He was started on milrinone  for low co-ox. Amiodarone  started for PVC suppression. He responded well to therapy. Diuresed, extubated and able to wean off milrionone. PVCs well suppressed w/ amiodarone . GDMT initiated + DAPT w/ ASA + Brilinta  for LAD stent. Amiodarone  was discontinued and low dose ? blocker added. Discharged home w/ [facility].  Echo this admission showed EF 25-30%.   Repeat echo in 6/22 showed EF up to 55-60% with mild mid-apical anterior hypokinesis, normal RV.  Echo in 9/23 showed EF 55-60%, grade 2 diastolic dysfunction, apical hypokinesis.   Cardiac PET in 3/24 showed apical lateral/apical primarily fixed defect with EF 44% => prior MI with peri-infarct ischemia.   He has been noted to have frequent PVCs.  Mexiletine was started, but Zio monitor in 6/24 still showed 22.1% PVCs.  Echo in 10/24 showed EF 50-55%, normal RV, normal IVC (not significantly different when compared to prior).   Echo in 5/25 showed EF 55-60%,  normal RV.   Patient returns for followup of CAD, ischemic cardiomyopathy, and PVCs.  He continues to play racketball twice a week.  No exertional dyspnea or chest pain with usual activities.  Mild dyspnea walking up a hill.  He feels occasional mild palpitations, no lightheadedness or syncope.  Weight stable.  No BRBPR/melena.   ECG (personally reviewed): NSR, septal Qs, PVCs  Labs (9/23): Lp(a) 19, hs-CRP 0.7 Labs (1/24): LDL 48, TGs 52, K 4.6, creatinine 0.94 Labs (3/24): K 3.7, creatinine 1.1, TSH normal Labs (9/24): K 4.4, creatinine 0.92, LDL 30 Labs (12/24): BNP 80, K 4.3, creatinine 0.93 Labs (5/25): Lp(a) 17, LDL 41, K 3.7, creatinine 9.11  PMH: 1. Hyperlipidemia 2. GERD with hiatal hernia and peptic stricture.  S/p robotic assisted laparoscopic paraesophageal hernia repair with fundoplication on 09/15/20.  3. CAD: Acute anterolateral STEMI 3/22 post-op paraesophageal hernia repair. 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  DES to LAD and PTCA D1.  - Cardiac PET (3/24): Apical lateral/apical primarily fixed defect with EF 44% => prior MI with peri-infarct ischemia.  4. Chronic systolic CHF: Ischemic cardiomyopathy.  - Echo (3/22): EF 25-30%, LAD territory WMAs, normal RV, trivial MR.  - Echo (6/22): 55-60% with mild mid-apical anterior hypokinesis, normal RV. - Echo (9/23): EF 55-60%, grade 2 diastolic dysfunction, apical hypokinesis. - Echo (10/24): EF 50-55%, normal RV, normal IVC (not significantly different when compared to prior) - Echo (5/25): EF 55-60%, normal RV. 5. HTN 6. PVCs: Zio in 6/24  with 22.1% PVCs.    Current Outpatient Medications  Medication Sig Dispense Refill   atorvastatin  (LIPITOR ) 80 MG tablet TAKE 1 TABLET BY MOUTH DAILY 90 tablet 3   BRILINTA  60 MG TABS tablet TAKE 1 TABLET BY MOUTH 2 TIMES A DAY 60 tablet 11   FARXIGA  10 MG TABS tablet TAKE 1 TABLET BY MOUTH EVERY DAY  90 tablet 3   metoprolol  succinate (TOPROL -XL) 25 MG 24 hr tablet TAKE 1 TABLET BY MOUTH DAILY 90 tablet 3   mexiletine (MEXITIL) 150 MG capsule TAKE 1 CAPSULE BY MOUTH 2 TIMES A DAY 180 capsule 3   pantoprazole  (PROTONIX ) 40 MG tablet TAKE 1 TABLET BY MOUTH DAILY 90 tablet 0   sacubitril -valsartan  (ENTRESTO ) 24-26 MG TAKE 1 TABLET BY MOUTH 2 TIMES A DAY 180 tablet 3   sildenafil  (VIAGRA ) 100 MG tablet Take 1 tablet (100 mg total) by mouth daily as needed for erectile dysfunction. 10 tablet 0   spironolactone  (ALDACTONE ) 25 MG tablet TAKE 1 TABLET BY MOUTH DAILY 90 tablet 3   No current facility-administered medications for this encounter.    Allergies  Allergen Reactions   Meperidine Hcl Nausea And Vomiting   Penicillins Other (See Comments)    REACTION: Childhood   Advil [Ibuprofen] Palpitations   Pseudoephedrine Hcl Palpitations      Social History   Socioeconomic History   Marital status: Married    Spouse name: Not on file   Number of children: Not on file   Years of education: Not on file   Highest education level: Associate degree: academic program  Occupational History   Not on file  Tobacco Use   Smoking status: Never   Smokeless tobacco: Never  Vaping Use   Vaping status: Never Used  Substance and Sexual Activity   Alcohol use: No   Drug use: No   Sexual activity: Not on file  Other Topics Concern   Not on file  Social History Narrative   Occupation: Technical sales engineer   Married   Never Smoked   Alcohol use- no   Social Drivers of Corporate investment banker Strain: Low Risk  (03/31/2024)   Overall Financial Resource Strain (CARDIA)    Difficulty of Paying Living Expenses: Not hard at all  Food Insecurity: No Food Insecurity (03/31/2024)   Hunger Vital Sign    Worried About Running Out of Food in the Last Year: Never true    Ran Out of Food in the Last Year: Never true  Transportation Needs: No Transportation Needs (03/31/2024)   PRAPARE -  Administrator, Civil Service (Medical): No    Lack of Transportation (Non-Medical): No  Physical Activity: Insufficiently Active (03/31/2024)   Exercise Vital Sign    Days of Exercise per Week: 4 days    Minutes of Exercise per Session: 30 min  Stress: No Stress Concern Present (03/31/2024)   [employer] of Occupational Health - Occupational Stress Questionnaire    Feeling of Stress: Not at all  Social Connections: Socially Integrated (03/31/2024)   Social Connection and Isolation Panel    Frequency of Communication with Friends and Family: More than three times a week    Frequency of Social Gatherings with Friends and Family: More than three times a week    Attends Religious Services: More than 4 times per year    Active Member of [employer] or Organizations: Yes    Attends Banker Meetings: More than 4 times per year  Marital Status: Married  Catering manager Violence: Not At Risk (07/11/2023)   Humiliation, Afraid, Rape, and Kick questionnaire    Fear of Current or Ex-Partner: No    Emotionally Abused: No    Physically Abused: No    Sexually Abused: No      Family History  Problem Relation Age of Onset   Heart disease Mother 67       CAD   Cancer Mother        lung   Hyperlipidemia Father    Stroke Father    Colon polyps Father    Diabetes Sister        type II   Heart attack Brother 48   Heart disease Brother 63       MI   Colon cancer Neg Hx    Esophageal cancer Neg Hx    Pancreatic cancer Neg Hx    Stomach cancer Neg Hx    Rectal cancer Neg Hx     Vitals:   04/03/24 0935  BP: (!) 92/58  Pulse: (!) 54  SpO2: 98%  Weight: 74.8 kg (165 lb)    PHYSICAL EXAM: General: NAD Neck: No JVD, no thyromegaly or thyroid  nodule.  Lungs: Clear to auscultation bilaterally with normal respiratory effort. CV: Nondisplaced PMI.  Heart regular S1/S2, no S3/S4, no murmur.  No peripheral edema.  No carotid bruit.  Normal pedal pulses.  Abdomen:  Soft, nontender, no hepatosplenomegaly, no distention.  Skin: Intact without lesions or rashes.  Neurologic: Alert and oriented x 3.  Psych: Normal affect. Extremities: No clubbing or cyanosis.  HEENT: Normal.   ASSESSMENT & PLAN: 1.  CAD: Post-op anterolateral STEMI 09/15/20.  No prior CAD but history of HTN and hyperlipidemia and strong FH of CAD.  He had occluded proximal LAD and D1, now s/p DES to LAD and PTCA to D1.  Cardiac PET in 3/24 showed apical infarction with mild peri-infarct ischemia.  No chest pain.   - Continue ticagrelor  60 mg bid.  I think he can stop ASA at this point.  - Continue atorvastatin  80 mg nightly. Good lipids in 5/25.  2. Systolic CHF: Ischemic cardiomyopathy. Echo in 3/22 with EF 25-30%, LAD territory WMAs with no evidence for mechanical MI complications.  Repeat echo in 6/22 with EF up to 55-60% with mid-apical anterior hypokinesis. Echo in 9/23 showed EF 55-60%, grade 2 diastolic dysfunction, apical hypokinesis.  However, cardiac PET in 3/24 showed EF 44%.  Echo in 10/24 was stable, EF 50-55%, normal RV, normal IVC (not significantly different when compared to prior). Echo in 5/25 showed EF 55-60%.  NYHA class I, not volume overloaded on exam.  I have been worried that heavy PVC burden may lead to fall in EF over time.  - Continue Entresto  24/26 bid.  BMET today.   - Continue spironolactone  25 mg  - Continue dapagliflozin  10 mg daily.   - Continue Toprol  XL 25 mg daily, he has not wanted to increase this due to perceived fatigue.  3. PVCs: Not significantly symptomatic. Low heart rate noted at home at times may be due to frequent/uncounted PVCs.  Zio in 6/24 with 22.1% PVCs despite use of mexiletine.  I worry that EF could drop over time with high PVC burden.  - Continue Toprol  XL 25 mg daily. As above, he does not want to increase this.  - Continue mexiletine.  - Would like to avoid amiodarone  if possible.  - He has seen EP.   Followup in 6  months.   I spent  22 minutes reviewing records, interviewing/examining patient, and managing orders.   Ezra Shuck, MD 04/03/24

## 2024-04-03 NOTE — Patient Instructions (Signed)
 STOP Asprin.  Labs done today, your results will be available in MyChart, we will contact you for abnormal readings.  Your physician recommends that you schedule a follow-up appointment in: 6 months ( April 2026) ** PLEASE CALL THE OFFICE IN FEBRUARY 2026 TO ARRANGE YOUR FOLLOW UP APPOINTMENT.**  If you have any questions or concerns before your next appointment please send us  a message through [place] or call our office at [phone].    TO LEAVE A MESSAGE FOR THE NURSE SELECT OPTION 2, PLEASE LEAVE A MESSAGE INCLUDING: YOUR NAME DATE OF BIRTH CALL BACK NUMBER REASON FOR CALL**this is important as we prioritize the call backs  YOU WILL RECEIVE A CALL BACK THE SAME DAY AS LONG AS YOU CALL BEFORE 4:00 PM  At the [facility], you and your health needs are our priority. As part of our continuing mission to provide you with exceptional heart care, we have created designated Provider Care Teams. These Care Teams include your primary Cardiologist (physician) and Advanced Practice Providers (APPs- Physician Assistants and Nurse Practitioners) who all work together to provide you with the care you need, when you need it.   You may see any of the following providers on your designated Care Team at your next follow up: Dr Toribio Fuel Dr Ezra Shuck Dr. Ria Commander Dr. Morene Brownie Amy Lenetta, NP Caffie Shed, [place] [facility] [place], [place] Beckey Coe, NP [place] Lee, NP Ellouise Class, NP Tinnie Redman, PharmD Jaun Bash, PharmD   Please be sure to bring in all your medications bottles to every appointment.    Thank you for choosing [facility]

## 2024-04-03 NOTE — Addendum Note (Signed)
 Addended by: METTA KRISTEN CROME on: 04/03/2024 02:55 PM   Modules accepted: Orders

## 2024-04-03 NOTE — Progress Notes (Signed)
 Established Patient Office Visit  Subjective   Patient ID: Jerry Kramer, male    DOB: 12/22/1955  Age: 68 y.o. MRN: [id number]  Chief Complaint  Patient presents with   Annual Exam    HPI   Jerry Kramer is seen today for physical exam.  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  Had stent placement at that time.  Had developed acute systolic heart failure and followed through cardiology heart failure clinic.  Doing very well at this time.  Latest echo showed ejection fraction 55 to 60%.  Plays racquetball several times per week.  No recent chest pains.  On multidrug regimen for heart failure including Aldactone , Entresto , metoprolol , Farxiga .  On high-dose statin with atorvastatin  80 mg daily.  Takes Brilinta  and also is on mexiletine for history of frequent PVCs.  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  He had basic metabolic panel this morning which was unremarkable.  Glucose was 124 but this was nonfasting.  Health maintenance reviewed:  Health Maintenance  Topic Date Due   Zoster Vaccines- Shingrix (1 of 2) Never done   Influenza Vaccine  01/27/2024   COVID-19 Vaccine (5 - 2025-26 season) 02/27/2024   Hepatitis C Screening  12/03/2030 (Originally 02/22/1974)   Medicare Annual Wellness (AWV)  07/10/2024   DTaP/Tdap/Td (3 - Td or Tdap) 09/16/2026   Colonoscopy  12/26/2028   Pneumococcal Vaccine: 50+ Years  Completed   Meningococcal B Vaccine  Aged Out   - Does need flu vaccine and consents to getting that -No history of Shingrix vaccine  Family History  Problem Relation Age of Onset   Heart disease Mother 88       CAD   Cancer Mother        lung   Hyperlipidemia Father    Stroke Father    Colon polyps Father    Diabetes Sister        type II   Heart attack Brother 48   Heart disease Brother 31       MI   Colon cancer Neg Hx    Esophageal cancer Neg Hx    Pancreatic cancer Neg Hx    Stomach cancer Neg Hx    Rectal cancer Neg Hx    Social History    Socioeconomic History   Marital status: Married    Spouse name: Not on file   Number of children: Not on file   Years of education: Not on file   Highest education level: Associate degree: academic program  Occupational History   Not on file  Tobacco Use   Smoking status: Never   Smokeless tobacco: Never  Vaping Use   Vaping status: Never Used  Substance and Sexual Activity   Alcohol use: No   Drug use: No   Sexual activity: Not on file  Other Topics Concern   Not on file  Social History Narrative   Occupation: Technical sales engineer   Married   Never Smoked   Alcohol use- no   Social Drivers of Corporate investment banker Strain: Low Risk  (03/31/2024)   Overall Financial Resource Strain (CARDIA)    Difficulty of Paying Living Expenses: Not hard at all  Food Insecurity: No Food Insecurity (03/31/2024)   Hunger Vital Sign    Worried About Running Out of Food in the Last Year: Never true    Ran Out of Food in the Last Year: Never true  Transportation Needs: No Transportation Needs (03/31/2024)  PRAPARE - Administrator, Civil Service (Medical): No    Lack of Transportation (Non-Medical): No  Physical Activity: Insufficiently Active (03/31/2024)   Exercise Vital Sign    Days of Exercise per Week: 4 days    Minutes of Exercise per Session: 30 min  Stress: No Stress Concern Present (03/31/2024)   [employer] of Occupational Health - Occupational Stress Questionnaire    Feeling of Stress: Not at all  Social Connections: Socially Integrated (03/31/2024)   Social Connection and Isolation Panel    Frequency of Communication with Friends and Family: More than three times a week    Frequency of Social Gatherings with Friends and Family: More than three times a week    Attends Religious Services: More than 4 times per year    Active Member of Clubs or Organizations: Yes    Attends Banker Meetings: More than 4 times per year    Marital Status: Married   Catering manager Violence: Not At Risk (07/11/2023)   Humiliation, Afraid, Rape, and Kick questionnaire    Fear of Current or Ex-Partner: No    Emotionally Abused: No    Physically Abused: No    Sexually Abused: No   Past Medical History:  Diagnosis Date   Allergy    CHF (congestive heart failure) (HCC)    Colon polyps    Complication of anesthesia    hard to wake up   CONTACT DERMATITIS 09/03/2009   ELEVATED BLOOD PRESSURE 06/19/2010   HYPERLIPIDEMIA 08/27/2009   UNSPECIFIED OTALGIA 06/19/2010   Past Surgical History:  Procedure Laterality Date   APPENDECTOMY  1983   BALLOON DILATION N/A 02/18/2022   Procedure: BALLOON DILATION;  Surgeon: Albertus Gordy HERO, MD;  Location: [facility];  Service: Gastroenterology;  Laterality: N/A;   BIOPSY  08/06/2020   Procedure: BIOPSY;  Surgeon: San Sandor GAILS, DO;  Location: [facility];  Service: Gastroenterology;;  esophageal manometry probe  placement   COLONOSCOPY N/A 12/27/2023   Procedure: COLONOSCOPY;  Surgeon: Albertus Gordy HERO, MD;  Location: [facility];  Service: Gastroenterology;  Laterality: N/A;   CORONARY/GRAFT ACUTE MI REVASCULARIZATION N/A 09/15/2020   Procedure: Coronary/Graft Acute MI Revascularization;  Surgeon: Anner Alm ORN, MD;  Location: [facility] INVASIVE CV LAB;  Service: Cardiovascular;  Laterality: N/A;   ESOPHAGEAL MANOMETRY N/A 08/06/2020   Procedure: ESOPHAGEAL MANOMETRY (EM);  Surgeon: San Sandor GAILS, DO;  Location: [facility];  Service: Gastroenterology;  Laterality: N/A;   ESOPHAGOGASTRODUODENOSCOPY N/A 09/15/2020   Procedure: ESOPHAGOGASTRODUODENOSCOPY (EGD);  Surgeon: Shyrl Linnie KIDD, MD;  Location[facility] OR;  Service: Thoracic;  Laterality: N/A;   ESOPHAGOGASTRODUODENOSCOPY N/A 12/27/2023   Procedure: EGD (ESOPHAGOGASTRODUODENOSCOPY);  Surgeon: Albertus Gordy HERO, MD;  Location: [facility];  Service: Gastroenterology;  Laterality: N/A;   ESOPHAGOGASTRODUODENOSCOPY (EGD) WITH PROPOFOL  N/A 08/06/2020   Procedure:  ESOPHAGOGASTRODUODENOSCOPY (EGD) WITH PROPOFOL ;  Surgeon: San Sandor GAILS, DO;  Location: [facility];  Service: Gastroenterology;  Laterality: N/A;   ESOPHAGOGASTRODUODENOSCOPY (EGD) WITH PROPOFOL  N/A 01/09/2022   Procedure: ESOPHAGOGASTRODUODENOSCOPY (EGD) WITH PROPOFOL ;  Surgeon: Rollin Dover, MD;  Location[facility]OSCOPY;  Service: Gastroenterology;  Laterality: N/A;   ESOPHAGOGASTRODUODENOSCOPY (EGD) WITH PROPOFOL  N/A 02/18/2022   Procedure: ESOPHAGOGASTRODUODENOSCOPY (EGD) WITH PROPOFOL ;  Surgeon: Albertus Gordy HERO, MD;  Location: [facility] ENDOSCOPY;  Service: Gastroenterology;  Laterality: N/A;   EYE MUSCLE SURGERY Right    LEFT HEART CATH AND CORONARY ANGIOGRAPHY N/A 09/15/2020   Procedure: LEFT HEART CATH AND CORONARY ANGIOGRAPHY;  Surgeon: Anner Alm ORN, MD;  Location:  [facility] INVASIVE CV LAB;  Service: Cardiovascular;  Laterality: N/A;   MIDDLE EAR SURGERY     ROTATOR CUFF REPAIR Left    TONSILLECTOMY  1965   XI ROBOTIC ASSISTED HIATAL HERNIA REPAIR N/A 09/15/2020   Procedure: XI ROBOTIC ASSISTED HIATAL HERNIA REPAIR WITH MESH;  Surgeon: Shyrl Linnie KIDD, MD;  Location: [facility] OR;  Service: Thoracic;  Laterality: N/A;    reports that he has never smoked. He has never used smokeless tobacco. He reports that he does not drink alcohol and does not use drugs. family history includes Cancer in his mother; Colon polyps in his father; Diabetes in his sister; Heart attack (age of onset: 69) in his brother; Heart disease (age of onset: 42) in his brother and mother; Hyperlipidemia in his father; Stroke in his father. Allergies  Allergen Reactions   Meperidine Hcl Nausea And Vomiting   Penicillins Other (See Comments)    REACTION: Childhood   Advil [Ibuprofen] Palpitations   Pseudoephedrine Hcl Palpitations    Review of Systems  Constitutional:  Negative for chills, fever, malaise/fatigue and weight loss.  HENT:  Negative for hearing loss.   Eyes:  Negative for blurred vision and double vision.   Respiratory:  Negative for cough and shortness of breath.   Cardiovascular:  Negative for chest pain, palpitations and leg swelling.  Gastrointestinal:  Negative for abdominal pain, blood in stool, constipation and diarrhea.  Genitourinary:  Negative for dysuria.  Skin:  Negative for rash.  Neurological:  Negative for dizziness, speech change, seizures, loss of consciousness and headaches.  Psychiatric/Behavioral:  Negative for depression.       Objective:     BP 90/60   Pulse 63   Temp 97.8 F (36.6 C) (Oral)   Ht 5' 5.35 (1.66 m)   Wt 165 lb 12.8 oz (75.2 kg)   SpO2 95%   BMI 27.29 kg/m  BP Readings from Last 3 Encounters:  04/03/24 90/60  04/03/24 (!) 92/58  12/27/23 (!) 103/48   Wt Readings from Last 3 Encounters:  04/03/24 165 lb 12.8 oz (75.2 kg)  04/03/24 165 lb (74.8 kg)  12/27/23 160 lb (72.6 kg)      Physical Exam Vitals reviewed.  Constitutional:      General: He is not in acute distress.    Appearance: He is well-developed. He is not ill-appearing.  HENT:     Head: Normocephalic and atraumatic.     Right Ear: External ear normal.     Left Ear: External ear normal.  Eyes:     Conjunctiva/sclera: Conjunctivae normal.     Pupils: Pupils are equal, round, and reactive to light.  Neck:     Thyroid : No thyromegaly.  Cardiovascular:     Rate and Rhythm: Normal rate and regular rhythm.     Heart sounds: Normal heart sounds. No murmur heard. Pulmonary:     Effort: No respiratory distress.     Breath sounds: No wheezing or rales.  Abdominal:     General: There is no distension.     Palpations: Abdomen is soft. There is no mass.     Tenderness: There is no abdominal tenderness. There is no guarding or rebound.  Musculoskeletal:     Cervical back: Normal range of motion and neck supple.     Right lower leg: No edema.     Left lower leg: No edema.  Lymphadenopathy:     Cervical: No cervical adenopathy.  Skin:    Findings: No rash.  Neurological:  Mental Status: He is alert and oriented to person, place, and time.     Cranial Nerves: No cranial nerve deficit.      Results for orders placed or performed during the hospital encounter of 04/03/24  Basic Metabolic Panel (BMET)  Result Value Ref Range   Sodium 139 135 - 145 mmol/L   Potassium 3.6 3.5 - 5.1 mmol/L   Chloride 111 98 - 111 mmol/L   CO2 20 (L) 22 - 32 mmol/L   Glucose, Bld 124 (H) 70 - 99 mg/dL   BUN 14 8 - 23 mg/dL   Creatinine, Ser 9.02 0.61 - 1.24 mg/dL   Calcium  9.1 8.9 - 10.3 mg/dL   GFR, Estimated >39 >39 mL/min   Anion gap 8 5 - 15    Last CBC Lab Results  Component Value Date   WBC 6.7 07/21/2022   HGB 17.0 07/21/2022   HCT 50.4 07/21/2022   MCV 93.8 07/21/2022   MCH 27.9 10/09/2020   RDW 14.3 07/21/2022   PLT 223.0 07/21/2022   Last metabolic panel Lab Results  Component Value Date   GLUCOSE 124 (H) 04/03/2024   NA 139 04/03/2024   K 3.6 04/03/2024   CL 111 04/03/2024   CO2 20 (L) 04/03/2024   BUN 14 04/03/2024   CREATININE 0.97 04/03/2024   GFRNONAA >60 04/03/2024   CALCIUM  9.1 04/03/2024   PROT 7.4 07/21/2022   ALBUMIN  4.9 07/21/2022   BILITOT 1.4 (H) 07/21/2022   ALKPHOS 110 07/21/2022   AST 29 07/21/2022   ALT 32 07/21/2022   ANIONGAP 8 04/03/2024   Last lipids Lab Results  Component Value Date   CHOL 94 11/18/2023   HDL 47 11/18/2023   LDLCALC 41 11/18/2023   TRIG 31 11/18/2023   CHOLHDL 2.0 11/18/2023      The ASCVD Risk score (Arnett DK, et al., 2019) failed to calculate for the following reasons:   Risk score cannot be calculated because patient has a medical history suggesting prior/existing ASCVD    Assessment & Plan:   Problem List Items Addressed This Visit   None Visit Diagnoses       Physical exam    -  Primary   Relevant Orders   PSA, Medicare   CBC with Differential/Platelet   Hepatic Function Panel     Cardiac history as above.  Patient stable with no recent chest pains.  Followed closely by  cardiology.  We discussed the following health maintenance items  -Recommend flu vaccine and patient consents - Patient requesting PSA screen today and this will be obtained along with CBC and hepatic panel.  Not repeating lipids or basic metabolic panel as these were done recently through cardiology - Did discuss Shingrix vaccine and he will consider - Pneumonia vaccines complete - Colonoscopy due 2030 - Tetanus due 2028  No follow-ups on file.    Wolm Scarlet, MD

## 2024-05-27 ENCOUNTER — Other Ambulatory Visit: Payer: Self-pay | Admitting: Internal Medicine

## 2024-05-27 ENCOUNTER — Other Ambulatory Visit: Payer: Self-pay | Admitting: Family Medicine

## 2024-05-27 ENCOUNTER — Other Ambulatory Visit (HOSPITAL_COMMUNITY): Payer: Self-pay | Admitting: Cardiology

## 2024-07-02 ENCOUNTER — Other Ambulatory Visit (HOSPITAL_COMMUNITY): Payer: Self-pay | Admitting: Cardiology

## 2024-07-02 ENCOUNTER — Other Ambulatory Visit: Payer: Self-pay

## 2024-07-02 DIAGNOSIS — I5022 Chronic systolic (congestive) heart failure: Secondary | ICD-10-CM

## 2024-07-02 DIAGNOSIS — I493 Ventricular premature depolarization: Secondary | ICD-10-CM

## 2024-07-03 ENCOUNTER — Other Ambulatory Visit: Payer: Self-pay | Admitting: Nurse Practitioner

## 2024-07-03 DIAGNOSIS — I5022 Chronic systolic (congestive) heart failure: Secondary | ICD-10-CM

## 2024-07-03 DIAGNOSIS — I493 Ventricular premature depolarization: Secondary | ICD-10-CM

## 2024-07-04 MED ORDER — PANTOPRAZOLE SODIUM 40 MG PO TBEC: 40.0000 mg | DELAYED_RELEASE_TABLET | Freq: Every day | ORAL | 1 refills | Status: AC

## 2024-07-04 MED ORDER — ATORVASTATIN CALCIUM 80 MG PO TABS: 80.0000 mg | ORAL_TABLET | Freq: Every day | ORAL | 3 refills | Status: AC

## 2024-07-04 MED ORDER — DAPAGLIFLOZIN PROPANEDIOL 10 MG PO TABS: 10.0000 mg | ORAL_TABLET | Freq: Every day | ORAL | 3 refills | Status: AC

## 2024-07-04 MED ORDER — METOPROLOL SUCCINATE ER 25 MG PO TB24: 25.0000 mg | ORAL_TABLET | Freq: Every day | ORAL | 3 refills | Status: AC

## 2024-07-04 MED ORDER — SPIRONOLACTONE 25 MG PO TABS: 25.0000 mg | ORAL_TABLET | Freq: Every day | ORAL | 3 refills | Status: AC

## 2024-07-04 NOTE — Telephone Encounter (Signed)
 This is a CHF pt

## 2024-07-04 NOTE — Telephone Encounter (Signed)
 Pt last seen in Gen Card by Damien Braver NP. Does Damien Braver NP want to refill this RX? Please advise.

## 2024-07-05 MED ORDER — METOPROLOL SUCCINATE ER 25 MG PO TB24: 25.0000 mg | ORAL_TABLET | Freq: Every day | ORAL | 3 refills | Status: AC

## 2024-07-05 NOTE — Telephone Encounter (Signed)
 See message from Camellia Ahle CMA below.

## 2024-07-06 ENCOUNTER — Other Ambulatory Visit: Payer: Self-pay | Admitting: Physician Assistant

## 2024-07-11 ENCOUNTER — Encounter (HOSPITAL_COMMUNITY): Payer: Self-pay | Admitting: Cardiology

## 2024-07-12 ENCOUNTER — Other Ambulatory Visit (HOSPITAL_COMMUNITY): Payer: Self-pay

## 2024-07-12 ENCOUNTER — Telehealth (HOSPITAL_COMMUNITY): Payer: Self-pay | Admitting: Pharmacist

## 2024-07-12 NOTE — Telephone Encounter (Signed)
 [facility] Patient Advocate Encounter  Prior Authorization for mexiletine has been approved.    Authorization# [id number] Effective dates: 06/28/24 through 06/27/25  Tinnie Redman, PharmD, BCPS, BCCP, CPP [facility] Failure [facility] Pharmacist [phone]

## 2024-07-16 ENCOUNTER — Ambulatory Visit: Payer: Medicare Other

## 2024-07-16 VITALS — Ht 65.0 in | Wt 160.0 lb

## 2024-07-16 DIAGNOSIS — Z0000 Encounter for general adult medical examination without abnormal findings: Secondary | ICD-10-CM | POA: Diagnosis not present

## 2024-07-16 NOTE — Patient Instructions (Addendum)
 [name],  Thank you for taking the time for your Medicare Wellness Visit. I appreciate your continued commitment to your health goals. Please review the care plan we discussed, and feel free to reach out if I can assist you further.  Please note that Annual Wellness Visits do not include a physical exam. Some assessments may be limited, especially if the visit was conducted virtually. If needed, we may recommend an in-person follow-up with your provider.  Ongoing Care Seeing your primary care provider every 3 to 6 months helps us  monitor your health and provide consistent, personalized care.   Referrals If a referral was made during today's visit and you haven't received any updates within two weeks, please contact the referred provider directly to check on the status.  Recommended Screenings:  Health Maintenance  Topic Date Due   Zoster (Shingles) Vaccine (1 of 2) Never done   COVID-19 Vaccine (5 - 2025-26 season) 02/27/2024   Hepatitis C Screening  12/03/2030*   Medicare Annual Wellness Visit  07/16/2025   DTaP/Tdap/Td vaccine (3 - Td or Tdap) 09/16/2026   Colon Cancer Screening  12/26/2028   Pneumococcal Vaccine for age over 82  Completed   Flu Shot  Completed   Meningitis B Vaccine  Aged Out  *Topic was postponed. The date shown is not the original due date.       07/16/2024    8:18 AM  Advanced Directives  Does Patient Have a Medical Advance Directive? Yes  Type of Estate Agent of [place];Living will  Does patient want to make changes to medical advance directive? No - Patient declined  Copy of Healthcare Power of Attorney in Chart? No - copy requested    Vision: Annual vision screenings are recommended for early detection of glaucoma, cataracts, and diabetic retinopathy. These exams can also reveal signs of chronic conditions such as diabetes and high blood pressure.  Dental: Annual dental screenings help detect early signs of oral cancer, gum  disease, and other conditions linked to overall health, including heart disease and diabetes.  Please see the attached documents for additional preventive care recommendations.

## 2024-07-16 NOTE — Progress Notes (Signed)
 "  Chief Complaint  Patient presents with   Medicare Wellness     Subjective:   Jerry Kramer is a 69 y.o. male who presents for a Medicare Annual Wellness Visit.  Visit info / Clinical Intake: Medicare Wellness Visit Type:: Subsequent Annual Wellness Visit Persons participating in visit and providing information:: patient Medicare Wellness Visit Mode:: Video Since this visit was completed virtually, some vitals may be partially provided or unavailable. Missing vitals are due to the limitations of the virtual format.: Documented vitals are patient reported If Telephone or Video please confirm:: I connected with patient using audio/video enable telemedicine. I verified patient identity with two identifiers, discussed telehealth limitations, and patient agreed to proceed. Patient Location:: Home Provider Location:: Office Interpreter Needed?: No Pre-visit prep was completed: yes AWV questionnaire completed by patient prior to visit?: yes Date:: 07/12/24 Living arrangements:: lives with spouse/significant other Patient's Overall Health Status Rating: very good Typical amount of pain: none Does pain affect daily life?: no Are you currently prescribed opioids?: no  Dietary Habits and Nutritional Risks How many meals a day?: 3 Eats fruit and vegetables daily?: yes Most meals are obtained by: preparing own meals; eating out Diabetic:: no  Functional Status Activities of Daily Living (to include ambulation/medication): Independent Ambulation: Independent with device- listed below Home Assistive Devices/Equipment: Eyeglasses Medication Administration: Independent Home Management (perform basic housework or laundry): Independent Manage your own finances?: yes Primary transportation is: driving Concerns about vision?: no *vision screening is required for WTM* Concerns about hearing?: no  Fall Screening Falls in the past year?: 0 Number of falls in past year: 0 Was there an injury  with Fall?: 0 Fall Risk Category Calculator: 0 Patient Fall Risk Level: Low Fall Risk  Fall Risk Patient at Risk for Falls Due to: No Fall Risks Fall risk Follow up: Falls evaluation completed  Home and Transportation Safety: All rugs have non-skid backing?: N/A, no rugs All stairs or steps have railings?: N/A, no stairs Grab bars in the bathtub or shower?: (!) no Have non-skid surface in bathtub or shower?: (!) no Good home lighting?: yes Regular seat belt use?: yes Hospital stays in the last year:: no  Cognitive Assessment Difficulty concentrating, remembering, or making decisions? : no Will 6CIT or Mini Cog be Completed: yes What year is it?: 0 points What month is it?: 0 points Give patient an address phrase to remember (5 components): [address] [place]  About what time is it?: 0 points Count backwards from 20 to 1: 0 points Say the months of the year in reverse: 0 points Repeat the address phrase from earlier: 0 points 6 CIT Score: 0 points  Advance Directives (For Healthcare) Does Patient Have a Medical Advance Directive?: Yes Does patient want to make changes to medical advance directive?: No - Patient declined Type of Advance Directive: Healthcare Power of [place]; Living will Copy of Healthcare Power of Attorney in Chart?: No - copy requested Copy of Living Will in Chart?: No - copy requested Would patient like information on creating a medical advance directive?: No - Patient declined  Reviewed/Updated  Reviewed/Updated: Reviewed All (Medical, Surgical, Family, Medications, Allergies, Care Teams, Patient Goals)    Allergies (verified) Meperidine hcl, Penicillins, Advil [ibuprofen], and Pseudoephedrine hcl   Current Medications (verified) Outpatient Encounter Medications as of 07/16/2024  Medication Sig   atorvastatin  (LIPITOR ) 80 MG tablet Take 1 tablet (80 mg total) by mouth daily.   BRILINTA  60 MG TABS tablet TAKE 1 TABLET BY MOUTH 2  TIMES A DAY    dapagliflozin  propanediol (FARXIGA ) 10 MG TABS tablet Take 1 tablet (10 mg total) by mouth daily.   metoprolol  succinate (TOPROL -XL) 25 MG 24 hr tablet Take 1 tablet (25 mg total) by mouth daily.   metoprolol  succinate (TOPROL -XL) 25 MG 24 hr tablet Take 1 tablet (25 mg total) by mouth daily.   mexiletine (MEXITIL) 150 MG capsule TAKE 1 CAPSULE BY MOUTH 2 TIMES A DAY   pantoprazole  (PROTONIX ) 40 MG tablet Take 1 tablet (40 mg total) by mouth daily.   sacubitril -valsartan  (ENTRESTO ) 24-26 MG TAKE 1 TABLET BY MOUTH 2 TIMES A DAY   sildenafil  (VIAGRA ) 100 MG tablet TAKE 1 TABLET BY MOUTH DAILY AS NEEDED FOR ERECTILE DYSFUNCTION   spironolactone  (ALDACTONE ) 25 MG tablet Take 1 tablet (25 mg total) by mouth daily.   No facility-administered encounter medications on file as of 07/16/2024.    History: Past Medical History:  Diagnosis Date   Allergy    CHF (congestive heart failure) (HCC)    Colon polyps    Complication of anesthesia    hard to wake up   CONTACT DERMATITIS 09/03/2009   ELEVATED BLOOD PRESSURE 06/19/2010   HYPERLIPIDEMIA 08/27/2009   UNSPECIFIED OTALGIA 06/19/2010   Past Surgical History:  Procedure Laterality Date   APPENDECTOMY  1983   BALLOON DILATION N/A 02/18/2022   Procedure: BALLOON DILATION;  Surgeon: Albertus Gordy HERO, MD;  Location: [facility] ENDOSCOPY;  Service: Gastroenterology;  Laterality: N/A;   BIOPSY  08/06/2020   Procedure: BIOPSY;  Surgeon: San Sandor GAILS, DO;  Location: [facility] ENDOSCOPY;  Service: Gastroenterology;;  esophageal manometry probe  placement   COLONOSCOPY N/A 12/27/2023   Procedure: COLONOSCOPY;  Surgeon: Albertus Gordy HERO, MD;  Location: [facility] ENDOSCOPY;  Service: Gastroenterology;  Laterality: N/A;   CORONARY/GRAFT ACUTE MI REVASCULARIZATION N/A 09/15/2020   Procedure: Coronary/Graft Acute MI Revascularization;  Surgeon: Anner Alm ORN, MD;  Location: [facility] INVASIVE CV LAB;  Service: Cardiovascular;  Laterality: N/A;   ESOPHAGEAL MANOMETRY N/A 08/06/2020    Procedure: ESOPHAGEAL MANOMETRY (EM);  Surgeon: San Sandor GAILS, DO;  Location: [facility] ENDOSCOPY;  Service: Gastroenterology;  Laterality: N/A;   ESOPHAGOGASTRODUODENOSCOPY N/A 09/15/2020   Procedure: ESOPHAGOGASTRODUODENOSCOPY (EGD);  Surgeon: Shyrl Linnie KIDD, MD;  Location[facility] OR;  Service: Thoracic;  Laterality: N/A;   ESOPHAGOGASTRODUODENOSCOPY N/A 12/27/2023   Procedure: EGD (ESOPHAGOGASTRODUODENOSCOPY);  Surgeon: Albertus Gordy HERO, MD;  Location: [facility] ENDOSCOPY;  Service: Gastroenterology;  Laterality: N/A;   ESOPHAGOGASTRODUODENOSCOPY (EGD) WITH PROPOFOL  N/A 08/06/2020   Procedure: ESOPHAGOGASTRODUODENOSCOPY (EGD) WITH PROPOFOL ;  Surgeon: San Sandor GAILS, DO;  Location: [facility] ENDOSCOPY;  Service: Gastroenterology;  Laterality: N/A;   ESOPHAGOGASTRODUODENOSCOPY (EGD) WITH PROPOFOL  N/A 01/09/2022   Procedure: ESOPHAGOGASTRODUODENOSCOPY (EGD) WITH PROPOFOL ;  Surgeon: Rollin Dover, MD;  Location: [facility] ENDOSCOPY;  Service: Gastroenterology;  Laterality: N/A;   ESOPHAGOGASTRODUODENOSCOPY (EGD) WITH PROPOFOL  N/A 02/18/2022   Procedure: ESOPHAGOGASTRODUODENOSCOPY (EGD) WITH PROPOFOL ;  Surgeon: Albertus Gordy HERO, MD;  Location: [facility] ENDOSCOPY;  Service: Gastroenterology;  Laterality: N/A;   EYE MUSCLE SURGERY Right    LEFT HEART CATH AND CORONARY ANGIOGRAPHY N/A 09/15/2020   Procedure: LEFT HEART CATH AND CORONARY ANGIOGRAPHY;  Surgeon: Anner Alm ORN, MD;  Location: [facility] INVASIVE CV LAB;  Service: Cardiovascular;  Laterality: N/A;   MIDDLE EAR SURGERY     ROTATOR CUFF REPAIR Left    TONSILLECTOMY  1965   XI ROBOTIC ASSISTED HIATAL HERNIA REPAIR N/A 09/15/2020   Procedure: XI ROBOTIC ASSISTED HIATAL HERNIA REPAIR WITH MESH;  Surgeon: Shyrl Linnie KIDD,  MD;  Location: [facility] OR;  Service: Thoracic;  Laterality: N/A;   Family History  Problem Relation Age of Onset   Heart disease Mother 16       CAD   Cancer Mother        lung   Hyperlipidemia Father    Stroke Father    Colon polyps Father    Diabetes Sister         type II   Heart attack Brother 91   Heart disease Brother 32       MI   Colon cancer Neg Hx    Esophageal cancer Neg Hx    Pancreatic cancer Neg Hx    Stomach cancer Neg Hx    Rectal cancer Neg Hx    Social History   Occupational History   Not on file  Tobacco Use   Smoking status: Never   Smokeless tobacco: Never  Vaping Use   Vaping status: Never Used  Substance and Sexual Activity   Alcohol use: No   Drug use: No   Sexual activity: Not on file   Tobacco Counseling Counseling given: No  SDOH Screenings   Food Insecurity: No Food Insecurity (07/16/2024)  Housing: Low Risk (07/16/2024)  Transportation Needs: No Transportation Needs (07/16/2024)  Utilities: Not At Risk (07/16/2024)  Alcohol Screen: Low Risk (07/12/2024)  Depression (PHQ2-9): Low Risk (07/16/2024)  Financial Resource Strain: Low Risk (07/12/2024)  Physical Activity: Sufficiently Active (07/16/2024)  Social Connections: Socially Integrated (07/16/2024)  Stress: No Stress Concern Present (07/16/2024)  Tobacco Use: Low Risk (07/16/2024)  Health Literacy: Adequate Health Literacy (07/16/2024)   See flowsheets for full screening details  Depression Screen PHQ 2 & 9 Depression Scale- Over the past 2 weeks, how often have you been bothered by any of the following problems? Little interest or pleasure in doing things: 0 Feeling down, depressed, or hopeless (PHQ Adolescent also includes...irritable): 0 PHQ-2 Total Score: 0     Goals Addressed               This Visit's Progress     Increase physical activity (pt-stated)        Stay Active!             Objective:    Today's Vitals   07/16/24 0817  Weight: 160 lb (72.6 kg)  Height: 5' 5 (1.651 m)   Body mass index is 26.63 kg/m.  Hearing/Vision screen Hearing Screening - Comments:: Denies hearing difficulties   Vision Screening - Comments:: Wears rx glasses - up to date with routine eye exams with  [facility] Immunizations and  Health Maintenance Health Maintenance  Topic Date Due   Zoster Vaccines- Shingrix (1 of 2) Never done   COVID-19 Vaccine (5 - 2025-26 season) 02/27/2024   Hepatitis C Screening  12/03/2030 (Originally 02/22/1974)   Medicare Annual Wellness (AWV)  07/16/2025   DTaP/Tdap/Td (3 - Td or Tdap) 09/16/2026   Colonoscopy  12/26/2028   Pneumococcal Vaccine: 50+ Years  Completed   Influenza Vaccine  Completed   Meningococcal B Vaccine  Aged Out        Assessment/Plan:  This is a routine wellness examination for Taedyn.  Patient Care Team: Micheal Wolm ORN, MD as PCP - General Inocencio, Soyla Lunger, MD as PCP - Electrophysiology (Cardiology) Rolan Ezra RAMAN, MD as PCP - Cardiology (Cardiology)  I have personally reviewed and noted the following in the patients chart:   Medical and social history Use of alcohol, tobacco or illicit drugs  Current medications and supplements including opioid prescriptions. Functional ability and status Nutritional status Physical activity Advanced directives List of other physicians Hospitalizations, surgeries, and ER visits in previous 12 months Vitals Screenings to include cognitive, depression, and falls Referrals and appointments  No orders of the defined types were placed in this encounter.  In addition, I have reviewed and discussed with patient certain preventive protocols, quality metrics, and best practice recommendations. A written personalized care plan for preventive services as well as general preventive health recommendations were provided to patient.   Rojelio LELON Blush, LPN   8/80/7973   Return in 53 weeks (on 07/22/2025).  After Visit Summary: (MyChart) Due to this being a telephonic visit, the after visit summary with patients personalized plan was offered to patient via MyChart   Nurse Notes: No voiced or noted concerns at this time "

## 2024-10-03 ENCOUNTER — Ambulatory Visit (HOSPITAL_COMMUNITY): Admitting: Cardiology

## 2025-07-22 ENCOUNTER — Ambulatory Visit
